# Patient Record
Sex: Female | Born: 1955 | Race: Black or African American | Hispanic: No | Smoking: Never smoker
Health system: Southern US, Community
[De-identification: ages and names within clinical notes are randomized; demographics above are authoritative.]

## PROBLEM LIST (undated history)

## (undated) DIAGNOSIS — I10 Essential (primary) hypertension: Secondary | ICD-10-CM

## (undated) DIAGNOSIS — M722 Plantar fascial fibromatosis: Secondary | ICD-10-CM

## (undated) DIAGNOSIS — M1990 Unspecified osteoarthritis, unspecified site: Secondary | ICD-10-CM

## (undated) DIAGNOSIS — D649 Anemia, unspecified: Secondary | ICD-10-CM

## (undated) DIAGNOSIS — E119 Type 2 diabetes mellitus without complications: Secondary | ICD-10-CM

## (undated) DIAGNOSIS — R51 Headache: Secondary | ICD-10-CM

## (undated) DIAGNOSIS — I2510 Atherosclerotic heart disease of native coronary artery without angina pectoris: Secondary | ICD-10-CM

## (undated) DIAGNOSIS — N200 Calculus of kidney: Secondary | ICD-10-CM

## (undated) DIAGNOSIS — R7302 Impaired glucose tolerance (oral): Secondary | ICD-10-CM

## (undated) DIAGNOSIS — T7840XA Allergy, unspecified, initial encounter: Secondary | ICD-10-CM

## (undated) DIAGNOSIS — E785 Hyperlipidemia, unspecified: Secondary | ICD-10-CM

## (undated) DIAGNOSIS — M8580 Other specified disorders of bone density and structure, unspecified site: Secondary | ICD-10-CM

## (undated) HISTORY — DX: Impaired glucose tolerance (oral): R73.02

## (undated) HISTORY — PX: OOPHORECTOMY: SHX86

## (undated) HISTORY — DX: Anemia, unspecified: D64.9

## (undated) HISTORY — DX: Unspecified osteoarthritis, unspecified site: M19.90

## (undated) HISTORY — DX: Essential (primary) hypertension: I10

## (undated) HISTORY — DX: Other specified disorders of bone density and structure, unspecified site: M85.80

## (undated) HISTORY — DX: Type 2 diabetes mellitus without complications: E11.9

## (undated) HISTORY — DX: Calculus of kidney: N20.0

## (undated) HISTORY — DX: Plantar fascial fibromatosis: M72.2

## (undated) HISTORY — DX: Allergy, unspecified, initial encounter: T78.40XA

## (undated) HISTORY — PX: ABDOMINAL HYSTERECTOMY: SHX81

## (undated) HISTORY — DX: Hyperlipidemia, unspecified: E78.5

## (undated) HISTORY — DX: Atherosclerotic heart disease of native coronary artery without angina pectoris: I25.10

## (undated) HISTORY — PX: TONSILLECTOMY AND ADENOIDECTOMY: SHX28

## (undated) HISTORY — DX: Headache: R51

---

## 2000-09-29 ENCOUNTER — Encounter: Payer: Self-pay | Admitting: Urology

## 2000-09-29 ENCOUNTER — Encounter: Payer: Self-pay | Admitting: Internal Medicine

## 2000-09-29 ENCOUNTER — Inpatient Hospital Stay (HOSPITAL_COMMUNITY): Admission: EM | Admit: 2000-09-29 | Discharge: 2000-10-02 | Payer: Self-pay | Admitting: Internal Medicine

## 2000-09-30 ENCOUNTER — Encounter: Payer: Self-pay | Admitting: Internal Medicine

## 2000-10-24 ENCOUNTER — Encounter: Payer: Self-pay | Admitting: Urology

## 2000-10-24 ENCOUNTER — Encounter: Admission: RE | Admit: 2000-10-24 | Discharge: 2000-10-24 | Payer: Self-pay | Admitting: Urology

## 2000-11-03 ENCOUNTER — Ambulatory Visit (HOSPITAL_COMMUNITY): Admission: RE | Admit: 2000-11-03 | Discharge: 2000-11-03 | Payer: Self-pay | Admitting: Urology

## 2000-11-03 ENCOUNTER — Encounter: Payer: Self-pay | Admitting: Urology

## 2000-11-28 ENCOUNTER — Encounter: Admission: RE | Admit: 2000-11-28 | Discharge: 2000-11-28 | Payer: Self-pay | Admitting: Urology

## 2000-11-28 ENCOUNTER — Encounter: Payer: Self-pay | Admitting: Urology

## 2000-12-08 ENCOUNTER — Ambulatory Visit (HOSPITAL_COMMUNITY): Admission: RE | Admit: 2000-12-08 | Discharge: 2000-12-08 | Payer: Self-pay | Admitting: Urology

## 2000-12-10 ENCOUNTER — Emergency Department (HOSPITAL_COMMUNITY): Admission: EM | Admit: 2000-12-10 | Discharge: 2000-12-10 | Payer: Self-pay | Admitting: Emergency Medicine

## 2001-11-01 ENCOUNTER — Ambulatory Visit (HOSPITAL_COMMUNITY): Admission: RE | Admit: 2001-11-01 | Discharge: 2001-11-01 | Payer: Self-pay | Admitting: Family Medicine

## 2001-11-01 ENCOUNTER — Encounter: Payer: Self-pay | Admitting: Family Medicine

## 2003-08-19 ENCOUNTER — Other Ambulatory Visit: Admission: RE | Admit: 2003-08-19 | Discharge: 2003-08-19 | Payer: Self-pay | Admitting: Obstetrics and Gynecology

## 2004-08-28 ENCOUNTER — Ambulatory Visit: Payer: Self-pay | Admitting: Internal Medicine

## 2004-10-08 ENCOUNTER — Other Ambulatory Visit: Admission: RE | Admit: 2004-10-08 | Discharge: 2004-10-08 | Payer: Self-pay | Admitting: Obstetrics and Gynecology

## 2004-12-02 ENCOUNTER — Ambulatory Visit: Payer: Self-pay | Admitting: Internal Medicine

## 2005-06-24 ENCOUNTER — Ambulatory Visit: Payer: Self-pay | Admitting: Internal Medicine

## 2005-06-29 ENCOUNTER — Ambulatory Visit: Payer: Self-pay | Admitting: Internal Medicine

## 2005-12-06 ENCOUNTER — Other Ambulatory Visit: Admission: RE | Admit: 2005-12-06 | Discharge: 2005-12-06 | Payer: Self-pay | Admitting: Obstetrics and Gynecology

## 2006-04-08 ENCOUNTER — Ambulatory Visit: Payer: Self-pay | Admitting: Internal Medicine

## 2006-04-18 ENCOUNTER — Ambulatory Visit: Payer: Self-pay | Admitting: Internal Medicine

## 2006-09-26 ENCOUNTER — Ambulatory Visit: Payer: Self-pay | Admitting: Internal Medicine

## 2006-09-26 LAB — CONVERTED CEMR LAB
ALT: 18 units/L (ref 0–40)
AST: 21 units/L (ref 0–37)
Albumin: 3.9 g/dL (ref 3.5–5.2)
Alkaline Phosphatase: 70 units/L (ref 39–117)
BUN: 10 mg/dL (ref 6–23)
CO2: 31 meq/L (ref 19–32)
Calcium: 9.6 mg/dL (ref 8.4–10.5)
Chloride: 107 meq/L (ref 96–112)
Chol/HDL Ratio, serum: 5.1
Cholesterol: 230 mg/dL (ref 0–200)
Creatinine, Ser: 0.8 mg/dL (ref 0.4–1.2)
GFR calc non Af Amer: 81 mL/min
Glomerular Filtration Rate, Af Am: 98 mL/min/{1.73_m2}
Glucose, Bld: 102 mg/dL — ABNORMAL HIGH (ref 70–99)
HCT: 38.8 % (ref 36.0–46.0)
HDL: 45.1 mg/dL (ref >39.0)
Hemoglobin: 13.1 g/dL (ref 12.0–15.0)
LDL DIRECT: 171.2 mg/dL
MCHC: 33.8 g/dL (ref 30.0–36.0)
MCV: 82 fL (ref 78.0–100.0)
Platelets: 264 10*3/uL (ref 150–400)
Potassium: 4.1 meq/L (ref 3.5–5.1)
RBC: 4.74 M/uL (ref 3.87–5.11)
RDW: 12.6 % (ref 11.5–14.6)
Sodium: 144 meq/L (ref 135–145)
TSH: 1.28 microintl units/mL (ref 0.35–5.50)
Total Bilirubin: 0.5 mg/dL (ref 0.3–1.2)
Total Protein: 7.9 g/dL (ref 6.0–8.3)
Triglyceride fasting, serum: 95 mg/dL (ref 0–149)
VLDL: 19 mg/dL (ref 0–40)
WBC: 7.7 10*3/uL (ref 4.5–10.5)

## 2006-09-29 ENCOUNTER — Ambulatory Visit: Payer: Self-pay | Admitting: Internal Medicine

## 2006-12-05 ENCOUNTER — Ambulatory Visit: Payer: Self-pay | Admitting: Internal Medicine

## 2007-07-03 ENCOUNTER — Ambulatory Visit: Payer: Self-pay | Admitting: Internal Medicine

## 2007-08-18 ENCOUNTER — Ambulatory Visit: Payer: Self-pay | Admitting: Internal Medicine

## 2007-09-28 ENCOUNTER — Ambulatory Visit: Payer: Self-pay | Admitting: Internal Medicine

## 2007-09-28 LAB — CONVERTED CEMR LAB
ALT: 13 units/L (ref 0–35)
AST: 15 units/L (ref 0–37)
Albumin: 3.8 g/dL (ref 3.5–5.2)
Alkaline Phosphatase: 52 units/L (ref 39–117)
BUN: 7 mg/dL (ref 6–23)
Basophils Absolute: 0 10*3/uL (ref 0.0–0.1)
Basophils Relative: 0.1 % (ref 0.0–1.0)
Bilirubin Urine: NEGATIVE
Bilirubin, Direct: 0.1 mg/dL (ref 0.0–0.3)
CO2: 29 meq/L (ref 19–32)
Calcium: 9.5 mg/dL (ref 8.4–10.5)
Chloride: 104 meq/L (ref 96–112)
Cholesterol: 210 mg/dL (ref 0–200)
Creatinine, Ser: 0.8 mg/dL (ref 0.4–1.2)
Direct LDL: 149 mg/dL
Eosinophils Absolute: 0.1 10*3/uL (ref 0.0–0.6)
Eosinophils Relative: 1.4 % (ref 0.0–5.0)
GFR calc Af Amer: 98 mL/min
GFR calc non Af Amer: 81 mL/min
Glucose, Bld: 103 mg/dL — ABNORMAL HIGH (ref 70–99)
Glucose, Urine, Semiquant: NEGATIVE
HCT: 36.5 % (ref 36.0–46.0)
HDL: 39.3 mg/dL (ref >39.0)
Hemoglobin: 12.6 g/dL (ref 12.0–15.0)
Ketones, urine, test strip: NEGATIVE
Lymphocytes Relative: 34.1 % (ref 12.0–46.0)
MCHC: 34.5 g/dL (ref 30.0–36.0)
MCV: 80.9 fL (ref 78.0–100.0)
Monocytes Absolute: 0.4 10*3/uL (ref 0.2–0.7)
Monocytes Relative: 8 % (ref 3.0–11.0)
Neutro Abs: 3 10*3/uL (ref 1.4–7.7)
Neutrophils Relative %: 56.4 % (ref 43.0–77.0)
Nitrite: NEGATIVE
Platelets: 294 10*3/uL (ref 150–400)
Potassium: 4.2 meq/L (ref 3.5–5.1)
RBC: 4.51 M/uL (ref 3.87–5.11)
RDW: 13.2 % (ref 11.5–14.6)
Sodium: 140 meq/L (ref 135–145)
Specific Gravity, Urine: 1.025
TSH: 1.25 microintl units/mL (ref 0.35–5.50)
Total Bilirubin: 0.7 mg/dL (ref 0.3–1.2)
Total CHOL/HDL Ratio: 5.3
Total Protein: 7.5 g/dL (ref 6.0–8.3)
Triglycerides: 82 mg/dL (ref 0–149)
Urobilinogen, UA: 0.2
VLDL: 16 mg/dL (ref 0–40)
WBC Urine, dipstick: NEGATIVE
WBC: 5.3 10*3/uL (ref 4.5–10.5)
pH: 5.5

## 2007-10-30 ENCOUNTER — Ambulatory Visit: Payer: Self-pay | Admitting: Internal Medicine

## 2007-10-30 DIAGNOSIS — N200 Calculus of kidney: Secondary | ICD-10-CM | POA: Insufficient documentation

## 2007-11-10 ENCOUNTER — Ambulatory Visit: Payer: Self-pay | Admitting: Internal Medicine

## 2007-11-17 ENCOUNTER — Ambulatory Visit: Payer: Self-pay | Admitting: Internal Medicine

## 2007-11-17 ENCOUNTER — Encounter: Payer: Self-pay | Admitting: Internal Medicine

## 2008-01-23 ENCOUNTER — Telehealth: Payer: Self-pay | Admitting: Internal Medicine

## 2008-04-02 ENCOUNTER — Ambulatory Visit: Payer: Self-pay | Admitting: Internal Medicine

## 2008-04-02 DIAGNOSIS — E785 Hyperlipidemia, unspecified: Secondary | ICD-10-CM | POA: Insufficient documentation

## 2008-09-11 ENCOUNTER — Ambulatory Visit: Payer: Self-pay | Admitting: Internal Medicine

## 2008-10-14 ENCOUNTER — Ambulatory Visit: Payer: Self-pay | Admitting: Internal Medicine

## 2008-10-14 LAB — CONVERTED CEMR LAB
Cholesterol: 241 mg/dL (ref 0–200)
HDL: 45.6 mg/dL (ref >39.0)
Total CHOL/HDL Ratio: 5.3
VLDL: 18 mg/dL (ref 0–40)

## 2008-11-15 ENCOUNTER — Telehealth: Payer: Self-pay | Admitting: Internal Medicine

## 2008-11-23 ENCOUNTER — Emergency Department (HOSPITAL_COMMUNITY): Admission: EM | Admit: 2008-11-23 | Discharge: 2008-11-23 | Payer: Self-pay | Admitting: Emergency Medicine

## 2009-01-01 ENCOUNTER — Encounter: Payer: Self-pay | Admitting: Internal Medicine

## 2009-04-01 ENCOUNTER — Ambulatory Visit: Payer: Self-pay | Admitting: Internal Medicine

## 2009-04-01 LAB — CONVERTED CEMR LAB
ALT: 13 units/L (ref 0–35)
Albumin: 3.8 g/dL (ref 3.5–5.2)
Basophils Relative: 0.2 % (ref 0.0–3.0)
Bilirubin, Direct: 0 mg/dL (ref 0.0–0.3)
CO2: 26 meq/L (ref 19–32)
Chloride: 111 meq/L (ref 96–112)
Creatinine, Ser: 0.7 mg/dL (ref 0.4–1.2)
Direct LDL: 156.3 mg/dL
Eosinophils Relative: 1.3 % (ref 0.0–5.0)
HCT: 35.2 % — ABNORMAL LOW (ref 36.0–46.0)
Hemoglobin: 12.3 g/dL (ref 12.0–15.0)
MCHC: 34.9 g/dL (ref 30.0–36.0)
MCV: 81 fL (ref 78.0–100.0)
Monocytes Absolute: 0.4 10*3/uL (ref 0.1–1.0)
Neutro Abs: 2.9 10*3/uL (ref 1.4–7.7)
Neutrophils Relative %: 56.9 % (ref 43.0–77.0)
Potassium: 4.2 meq/L (ref 3.5–5.1)
RBC: 4.34 M/uL (ref 3.87–5.11)
Sodium: 140 meq/L (ref 135–145)
Total CHOL/HDL Ratio: 5
Total Protein: 7.7 g/dL (ref 6.0–8.3)
Urobilinogen, UA: 0.2
VLDL: 12 mg/dL (ref 0.0–40.0)
WBC Urine, dipstick: NEGATIVE
WBC: 5.2 10*3/uL (ref 4.5–10.5)

## 2009-04-22 ENCOUNTER — Ambulatory Visit: Payer: Self-pay | Admitting: Internal Medicine

## 2009-04-22 DIAGNOSIS — I10 Essential (primary) hypertension: Secondary | ICD-10-CM | POA: Insufficient documentation

## 2009-07-30 ENCOUNTER — Ambulatory Visit: Payer: Self-pay | Admitting: Family Medicine

## 2009-07-31 ENCOUNTER — Telehealth: Payer: Self-pay | Admitting: Internal Medicine

## 2009-08-27 ENCOUNTER — Ambulatory Visit: Payer: Self-pay | Admitting: Internal Medicine

## 2009-09-29 ENCOUNTER — Ambulatory Visit: Payer: Self-pay | Admitting: Internal Medicine

## 2009-10-27 ENCOUNTER — Telehealth: Payer: Self-pay | Admitting: Internal Medicine

## 2009-10-27 ENCOUNTER — Ambulatory Visit: Payer: Self-pay | Admitting: Internal Medicine

## 2010-03-03 ENCOUNTER — Ambulatory Visit: Payer: Self-pay | Admitting: Internal Medicine

## 2010-05-15 ENCOUNTER — Ambulatory Visit: Payer: Self-pay | Admitting: Internal Medicine

## 2010-05-15 DIAGNOSIS — E739 Lactose intolerance, unspecified: Secondary | ICD-10-CM

## 2010-07-06 ENCOUNTER — Ambulatory Visit: Payer: Self-pay | Admitting: Internal Medicine

## 2010-07-06 DIAGNOSIS — M549 Dorsalgia, unspecified: Secondary | ICD-10-CM | POA: Insufficient documentation

## 2010-10-22 ENCOUNTER — Other Ambulatory Visit: Payer: Self-pay | Admitting: Internal Medicine

## 2010-10-22 ENCOUNTER — Ambulatory Visit
Admission: RE | Admit: 2010-10-22 | Discharge: 2010-10-22 | Payer: Self-pay | Source: Home / Self Care | Attending: Internal Medicine | Admitting: Internal Medicine

## 2010-10-22 LAB — CBC WITH DIFFERENTIAL/PLATELET
Basophils Absolute: 0 10*3/uL (ref 0.0–0.1)
Basophils Relative: 0.6 % (ref 0.0–3.0)
Eosinophils Absolute: 0.1 10*3/uL (ref 0.0–0.7)
Eosinophils Relative: 1.1 % (ref 0.0–5.0)
HCT: 37.1 % (ref 36.0–46.0)
Hemoglobin: 12.8 g/dL (ref 12.0–15.0)
Lymphocytes Relative: 27.5 % (ref 12.0–46.0)
Lymphs Abs: 1.7 10*3/uL (ref 0.7–4.0)
MCHC: 34.6 g/dL (ref 30.0–36.0)
MCV: 82 fl (ref 78.0–100.0)
Monocytes Absolute: 0.5 10*3/uL (ref 0.1–1.0)
Monocytes Relative: 8.4 % (ref 3.0–12.0)
Neutro Abs: 3.9 10*3/uL (ref 1.4–7.7)
Neutrophils Relative %: 62.4 % (ref 43.0–77.0)
Platelets: 236 10*3/uL (ref 150.0–400.0)
RBC: 4.52 Mil/uL (ref 3.87–5.11)
RDW: 13.9 % (ref 11.5–14.6)
WBC: 6.3 10*3/uL (ref 4.5–10.5)

## 2010-10-22 LAB — CONVERTED CEMR LAB
Nitrite: NEGATIVE
Protein, U semiquant: NEGATIVE
Specific Gravity, Urine: 1.025
WBC Urine, dipstick: NEGATIVE

## 2010-10-23 LAB — LDL CHOLESTEROL, DIRECT: Direct LDL: 159.3 mg/dL

## 2010-10-23 LAB — HEPATIC FUNCTION PANEL
ALT: 13 U/L (ref 0–35)
AST: 16 U/L (ref 0–37)
Albumin: 3.7 g/dL (ref 3.5–5.2)
Alkaline Phosphatase: 50 U/L (ref 39–117)
Bilirubin, Direct: 0.1 mg/dL (ref 0.0–0.3)
Total Bilirubin: 0.8 mg/dL (ref 0.3–1.2)
Total Protein: 7.3 g/dL (ref 6.0–8.3)

## 2010-10-23 LAB — LIPID PANEL
Cholesterol: 219 mg/dL — ABNORMAL HIGH (ref 0–200)
HDL: 40.9 mg/dL (ref >39.00)
Total CHOL/HDL Ratio: 5
Triglycerides: 137 mg/dL (ref 0.0–149.0)
VLDL: 27.4 mg/dL (ref 0.0–40.0)

## 2010-10-23 LAB — BASIC METABOLIC PANEL
BUN: 11 mg/dL (ref 6–23)
CO2: 30 mEq/L (ref 19–32)
Calcium: 9.6 mg/dL (ref 8.4–10.5)
Chloride: 105 mEq/L (ref 96–112)
Creatinine, Ser: 0.9 mg/dL (ref 0.4–1.2)
GFR: 88.6 mL/min (ref >60.00)
Glucose, Bld: 107 mg/dL — ABNORMAL HIGH (ref 70–99)
Potassium: 4.5 mEq/L (ref 3.5–5.1)
Sodium: 138 mEq/L (ref 135–145)

## 2010-10-23 LAB — TSH: TSH: 1.32 u[IU]/mL (ref 0.35–5.50)

## 2010-10-30 ENCOUNTER — Ambulatory Visit
Admission: RE | Admit: 2010-10-30 | Discharge: 2010-10-30 | Payer: Self-pay | Source: Home / Self Care | Attending: Internal Medicine | Admitting: Internal Medicine

## 2010-11-17 NOTE — Assessment & Plan Note (Signed)
Summary: CUT ON FOOT/NJR   Vital Signs:  Patient profile:   55 year old female Weight:      155 pounds Temp:     98.2 degrees F oral BP sitting:   116 / 72  (right arm) Cuff size:   regular  Vitals Entered By: Duard Brady LPN (May 15, 2010 4:23 PM) CC: had split in skin under (R) pinky ote - worried about diabetes.  Is Patient Diabetic? No   CC:  had split in skin under (R) pinky ote - worried about diabetes. .  History of Present Illness: 55 year old patient who has a history of impaired glucose tolerance who noted a lesion involving the plantar surface of her right fifth toe.  she was concerned about diabetic complications.  The area has largely resolved.  She does have treated hypertension.  Allergies (verified): No Known Drug Allergies  Physical Exam  General:  Well-developed,well-nourished,in no acute distress; alert,appropriate and cooperative throughout examination; blood pressure 110/70 Skin:  mound area of scaling involving plantar aspect of her right fifth toe at the junction of the MTP joint.  No erythema or other inflammatory changes noted   Impression & Recommendations:  Problem # 1:  HYPERTENSION (ICD-401.9)  Her updated medication list for this problem includes:    Lisinopril 20 Mg Tabs (Lisinopril) ..... One daily  Problem # 2:  IMPAIRED GLUCOSE TOLERANCE (ICD-271.3)  Complete Medication List: 1)  Famvir 250 Mg Tabs (Famciclovir) .... As dir 2)  Lisinopril 20 Mg Tabs (Lisinopril) .... One daily 3)  Hydrocodone-homatropine 5-1.5 Mg/75ml Syrp (Hydrocodone-homatropine) .Marland Kitchen.. 1 teaspoon every 6 hours as needed for cough  Patient Instructions: 1)  Please schedule a follow-up appointment in 6 months for CPX 2)  It is important that you exercise regularly at least 20 minutes 5 times a week. If you develop chest pain, have severe difficulty breathing, or feel very tired , stop exercising immediately and seek medical attention. 3)  Check your Blood Pressure  regularly. If it is above: you should make an appointment.

## 2010-11-17 NOTE — Progress Notes (Signed)
Summary: bp high  Phone Note Call from Patient Call back at [phone]   Caller: vm Call For: kwia Summary of Call: BP still high on 20mg  lisinopril & feeling some kind of pains in my chest.  140/94 146/94.  Dec ov.   Initial call taken by: Rudy Jew, RN,  October 27, 2009 11:38 AM  Follow-up for Phone Call        appt scheduled with Dr. Kirtland Bouchard. Follow-up by: Lynann Beaver CMA,  October 27, 2009 11:42 AM

## 2010-11-17 NOTE — Assessment & Plan Note (Signed)
Summary: elevated BP/dm   Vital Signs:  Patient profile:   55 year old female BP sitting:   124 / 68  (left arm) Cuff size:   regular  Vitals Entered By: Raechel Ache, RN (October 27, 2009 3:35 PM) CC: Talk about BP Is Patient Diabetic? No   CC:  Talk about BP.  History of Present Illness: 55 year old patient seen today for follow-up of her hypertension.  She has been quite displeased with her blood pressure control.  She states since he takes blood pressure, insulin morning and blood pressure readings are often elevated above 140/90.  She has been on combination therapy since 2009.  Due to stage II hypertension in the past.  Two months ago.  Diuretic therapy was discontinued due to symptomatic orthostatic hypotension.  Allergies: No Known Drug Allergies  Past History:  Past Medical History: Reviewed history from 04/22/2009 and no changes required. Kidney Stones UTIs Hyperlipidemia IGT Hypertension Headache  Physical Exam  General:  Well-developed,well-nourished,in no acute distress; alert,appropriate and cooperative throughout examination; multiple blood pressure is were taken during this encounter.  The highest blood pressure on arrival is 124/68; blood pressure readings were as low as 106/60 Mouth:  Oral mucosa and oropharynx without lesions or exudates.  Teeth in good repair. Neck:  No deformities, masses, or tenderness noted. Lungs:  Normal respiratory effort, chest expands symmetrically. Lungs are clear to auscultation, no crackles or wheezes. Heart:  Normal rate and regular rhythm. S1 and S2 normal without gallop, murmur, click, rub or other extra sounds. Abdomen:  Bowel sounds positive,abdomen soft and non-tender without masses, organomegaly or hernias noted.   Impression & Recommendations:  Problem # 1:  HYPERTENSION (ICD-401.9)  Her updated medication list for this problem includes:    Lisinopril 20 Mg Tabs (Lisinopril) ..... One daily circadian blood  pressure was higher earlier morning readings were discussed with the patient.  She will monitor blood pressure throughout the day.  Continue her present regimen.  At this time  Problem # 2:  NEPHROLITHIASIS (ICD-592.0)  Complete Medication List: 1)  Famvir 250 Mg Tabs (Famciclovir) .... As dir 2)  Lisinopril 20 Mg Tabs (Lisinopril) .... One daily  Patient Instructions: 1)  Limit your Sodium (Salt). 2)  It is important that you exercise regularly at least 20 minutes 5 times a week. If you develop chest pain, have severe difficulty breathing, or feel very tired , stop exercising immediately and seek medical attention. 3)  Check your Blood Pressure regularly. If it is above: 140/90  you should make an appointment.

## 2010-11-17 NOTE — Assessment & Plan Note (Signed)
Summary: lower back pain/cjr   new and a and in a date, and a the in a and aand he and his the will and is in and a the or for a and and a and and a the, and a no and a pain in he he a is not requiring him to and in a PA and a pain in the on a the a and and and the  Vital Signs:  Patient profile:   55 year old female Weight:      158 pounds Temp:     98.1 degrees F oral BP sitting:   110 / 70  (left arm) Cuff size:   regular  Vitals Entered By: Duard Brady LPN (July 06, 2010 4:03 PM) CC: c/o lowback pain , (L) flank pain , especially when she sits Is Patient Diabetic? No Flu Vaccine Consent Questions     Do you have a history of severe allergic reactions to this vaccine? no    Any prior history of allergic reactions to egg and/or gelatin? no    Do you have a sensitivity to the preservative Thimersol? no    Do you have a past history of Guillan-Barre Syndrome? no    Do you currently have an acute febrile illness? no    Have you ever had a severe reaction to latex? no    Vaccine information given and explained to patient? yes    Are you currently pregnant? no    Lot Number:[id number]   Exp Date:04/17/2011   Site Given  Left Deltoid IM   CC:  c/o lowback pain , (L) flank pain , and especially when she sits.  History of Present Illness: 55 year old patient with a one-week history of left lower back pain.  She has a history of kidney stones, but this pain is clearly different.  Pain began after doing some dancing with a vigorous, twisting, and, other movements.  She has been using low-dose Aleve.  She has treated hypertension  Allergies (verified): No Known Drug Allergies  Past History:  Past Medical History: Reviewed history from 04/22/2009 and no changes required. Kidney Stones UTIs Hyperlipidemia IGT Hypertension Headache  Physical Exam  General:  Well-developed,well-nourished,in no acute distress; alert,appropriate and cooperative throughout  examination Lungs:  Normal respiratory effort, chest expands symmetrically. Lungs are clear to auscultation, no crackles or wheezes. Heart:  Normal rate and regular rhythm. S1 and S2 normal without gallop, murmur, click, rub or other extra sounds. Msk:  lumbar musculature tight and tense straight leg testing negative neurovascular structures intact   Impression & Recommendations:  Problem # 1:  LOW BACK PAIN, ACUTE (ICD-724.2)  Her updated medication list for this problem includes:    Cyclobenzaprine Hcl 10 Mg Tabs (Cyclobenzaprine hcl) ..... One tablet 3 times daily  Orders: UA Dipstick w/o Micro (manual) (16109)  Her updated medication list for this problem includes:    Cyclobenzaprine Hcl 10 Mg Tabs (Cyclobenzaprine hcl) ..... One tablet 3 times daily  Problem # 2:  HYPERTENSION (ICD-401.9)  Her updated medication list for this problem includes:    Lisinopril 20 Mg Tabs (Lisinopril) ..... One daily  Her updated medication list for this problem includes:    Lisinopril 20 Mg Tabs (Lisinopril) ..... One daily  Complete Medication List: 1)  Famvir 250 Mg Tabs (Famciclovir) .... As dir 2)  Lisinopril 20 Mg Tabs (Lisinopril) .... One daily 3)  Cyclobenzaprine Hcl 10 Mg Tabs (Cyclobenzaprine hcl) .... One tablet 3 times daily  Other Orders: Admin  1st Vaccine (16109) Flu Vaccine 54yrs + (60454) Depo- Medrol 80mg  (J1040) Admin of Therapeutic Inj  intramuscular or subcutaneous (09811)  Patient Instructions: 1)  Most patients (90%) with low back pain will improve with time (2-6 weeks). Keep active but avoid activities that are painful. Apply moist heat and/or ice to lower back several times a day. Prescriptions: CYCLOBENZAPRINE HCL 10 MG TABS (CYCLOBENZAPRINE HCL) one tablet 3 times daily  #30 x 0   Entered and Authorized by:   Gordy Savers  MD   Signed by:   Gordy Savers  MD on 07/06/2010   Method used:   Electronically to        [facility] [phone]* (retail)        [address]       [place], [place]  [address]       Ph: [phone] or [phone]       Fax: [phone]   RxID:   [id number] LISINOPRIL 20 MG TABS (LISINOPRIL) one daily  #90 x 6   Entered and Authorized by:   Gordy Savers  MD   Signed by:   Gordy Savers  MD on 07/06/2010   Method used:   Electronically to        [facility] [phone]* (retail)       [address]       [place], [place]  [address]       Ph: [phone] or [phone]       Fax: [phone]   RxID:   [id number]   and a  Medication Administration  Injection # 1:    Medication: Depo- Medrol 80mg     Diagnosis: BACK PAIN (ICD-724.5)    Route: IM    Site: LUOQ gluteus    Exp Date: 01/2013    Lot #: [id number]    Mfr: Pharmacia    Patient tolerated injection without complications    Given by: Duard Brady LPN (July 06, 2010 4:39 PM)  Orders Added: 1)  Admin 1st Vaccine [90471] 2)  Flu Vaccine 19yrs + [76160] 3)  UA Dipstick w/o Micro (manual) [81002] 4)  Est. Patient Level III [73710] 5)  Depo- Medrol 80mg  [J1040] 6)  Admin of Therapeutic Inj  intramuscular or subcutaneous [62694]

## 2010-11-17 NOTE — Assessment & Plan Note (Signed)
Summary: COUGH/CONGESTION/?FEVER/CJR   Vital Signs:  Patient profile:   55 year old female Weight:      152 pounds Temp:     98.5 degrees F oral BP sitting:   110 / 62  (left arm) Cuff size:   regular  Vitals Entered By: Duard Brady LPN (Mar 03, 2010 3:54 PM) CC: c/o cough - productive and waking , congestion  Is Patient Diabetic? No   CC:  c/o cough - productive and waking  and congestion .  History of Present Illness: 55 year old patient who has a history of hypertension.  She presents with a 5-day history of largely nonproductive cough.  Cough is in quite vigorous and at times interferes with sleep.  There is been no definite fever.  She feels her cough has modestly improved.  Antihypertensive regimen includes lisinopril.  She is concerned that lisinopril may be aggravating or causing her cough  Preventive Screening-Counseling & Management  Alcohol-Tobacco     Smoking Status: never  Allergies (verified): No Known Drug Allergies  Past History:  Past Medical History: Reviewed history from 04/22/2009 and no changes required. Kidney Stones UTIs Hyperlipidemia IGT Hypertension Headache  Review of Systems       The patient complains of prolonged cough.  The patient denies anorexia, fever, weight loss, weight gain, vision loss, decreased hearing, hoarseness, chest pain, syncope, dyspnea on exertion, peripheral edema, headaches, hemoptysis, abdominal pain, melena, hematochezia, severe indigestion/heartburn, hematuria, incontinence, genital sores, muscle weakness, suspicious skin lesions, transient blindness, difficulty walking, depression, unusual weight change, abnormal bleeding, enlarged lymph nodes, angioedema, and breast masses.    Physical Exam  General:  Well-developed,well-nourished,in no acute distress; alert,appropriate and cooperative throughout examination Head:  Normocephalic and atraumatic without obvious abnormalities. No apparent alopecia or  balding. Eyes:  No corneal or conjunctival inflammation noted. EOMI. Perrla. Funduscopic exam benign, without hemorrhages, exudates or papilledema. Vision grossly normal. Ears:  External ear exam shows no significant lesions or deformities.  Otoscopic examination reveals clear canals, tympanic membranes are intact bilaterally without bulging, retraction, inflammation or discharge. Hearing is grossly normal bilaterally. Mouth:  Oral mucosa and oropharynx without lesions or exudates.  Teeth in good repair. Neck:  No deformities, masses, or tenderness noted. Lungs:  Normal respiratory effort, chest expands symmetrically. Lungs are clear to auscultation, no crackles or wheezes. Heart:  Normal rate and regular rhythm. S1 and S2 normal without gallop, murmur, click, rub or other extra sounds. Abdomen:  Bowel sounds positive,abdomen soft and non-tender without masses, organomegaly or hernias noted.   Impression & Recommendations:  Problem # 1:  URI (ICD-465.9)  Her updated medication list for this problem includes:    Hydrocodone-homatropine 5-1.5 Mg/71ml Syrp (Hydrocodone-homatropine) .Marland Kitchen... 1 teaspoon every 6 hours as needed for cough  Problem # 2:  HYPERTENSION (ICD-401.9)  Her updated medication list for this problem includes:    Lisinopril 20 Mg Tabs (Lisinopril) ..... One daily  Complete Medication List: 1)  Famvir 250 Mg Tabs (Famciclovir) .... As dir 2)  Lisinopril 20 Mg Tabs (Lisinopril) .... One daily 3)  Hydrocodone-homatropine 5-1.5 Mg/37ml Syrp (Hydrocodone-homatropine) .Marland Kitchen.. 1 teaspoon every 6 hours as needed for cough  Patient Instructions: 1)  substitute Benicar for lisinopril for one month 2)  Delsym 1 teaspoon twice daily 3)  Get plenty of rest, drink lots of clear liquids, and use Tylenol or Ibuprofen for fever and comfort. Return in 7-10 days if you're not better:sooner if you're feeling worse. Prescriptions: HYDROCODONE-HOMATROPINE 5-1.5 MG/5ML SYRP (HYDROCODONE-HOMATROPINE)  1 teaspoon every 6  hours as needed for cough  #6 oz x 0   Entered and Authorized by:   Gordy Savers  MD   Signed by:   Gordy Savers  MD on 03/03/2010   Method used:   Print then Give to Patient   RxID:   [id number]

## 2010-11-19 NOTE — Assessment & Plan Note (Signed)
Summary: CPX/CJR   Vital Signs:  Patient profile:   55 year old female Height:      60.5 inches Weight:      159 pounds BMI:     30.65 Temp:     98.0 degrees F oral BP sitting:   128 / 80  (left arm) Cuff size:   regular  Vitals Entered By: Duard Brady LPN (October 30, 2010 2:57 PM) CC: cpx - doing well Is Patient Diabetic? No   CC:  cpx - doing well.  History of Present Illness: 55 year old patient seen today for a health maintenance examination.  She has a history of impaired glucose tolerance, hypertension, and nephrolithiasis.  For that.  She is doing quite well.  She is followed every spring by gynecology.  Colonoscopy in 2009.  Allergies (verified): No Known Drug Allergies  Past History:  Past Medical History: Reviewed history from 04/22/2009 and no changes required. Kidney Stones UTIs Hyperlipidemia IGT Hypertension Headache  Past Surgical History: Reviewed history from 04/22/2009 and no changes required. C/S Hysterectomy Tonsillectomy  colonoscopy  1-09    Family History: Reviewed history from 04/22/2009 and no changes required. Family History Diabetes 1st degree relative Family History High cholesterol Family History Hypertension Family History of Prostate CA 1st degree relative <50 Family History Weight disorder Family History Kidney disease  father died recently at age 12 of prostate cancer mother history of glucose intolerance hypercholesterolemia, hypothyroidism  Two sisters positive for obesity; one sister had an MI at age 71.  History marked exogenous obesity, as well as tobacco use  Social History: Reviewed history from 07/03/2007 and no changes required. Occupation: Single Never Smoked Alcohol use-no Drug use-no Regular exercise-yes  Review of Systems  The patient denies anorexia, fever, weight loss, weight gain, vision loss, decreased hearing, hoarseness, chest pain, syncope, dyspnea on exertion, peripheral edema,  prolonged cough, headaches, hemoptysis, abdominal pain, melena, hematochezia, severe indigestion/heartburn, hematuria, incontinence, genital sores, muscle weakness, suspicious skin lesions, transient blindness, difficulty walking, depression, unusual weight change, abnormal bleeding, enlarged lymph nodes, angioedema, and breast masses.    Physical Exam  General:  Well-developed,well-nourished,in no acute distress; alert,appropriate and cooperative throughout examination Head:  Normocephalic and atraumatic without obvious abnormalities. No apparent alopecia or balding. Eyes:  No corneal or conjunctival inflammation noted. EOMI. Perrla. Funduscopic exam benign, without hemorrhages, exudates or papilledema. Vision grossly normal. Ears:  External ear exam shows no significant lesions or deformities.  Otoscopic examination reveals clear canals, tympanic membranes are intact bilaterally without bulging, retraction, inflammation or discharge. Hearing is grossly normal bilaterally. Nose:  External nasal examination shows no deformity or inflammation. Nasal mucosa are pink and moist without lesions or exudates. Mouth:  Oral mucosa and oropharynx without lesions or exudates.  Teeth in good repair. Neck:  No deformities, masses, or tenderness noted. Chest Wall:  No deformities, masses, or tenderness noted. Breasts:  No mass, nodules, thickening, tenderness, bulging, retraction, inflamation, nipple discharge or skin changes noted.   Lungs:  Normal respiratory effort, chest expands symmetrically. Lungs are clear to auscultation, no crackles or wheezes. Heart:  Normal rate and regular rhythm. S1 and S2 normal without gallop, murmur, click, rub or other extra sounds. Abdomen:  Bowel sounds positive,abdomen soft and non-tender without masses, organomegaly or hernias noted. Msk:  No deformity or scoliosis noted of thoracic or lumbar spine.   Pulses:  R and L carotid,radial,femoral,dorsalis pedis and posterior tibial  pulses are full and equal bilaterally Extremities:  No clubbing, cyanosis, edema, or deformity noted with  normal full range of motion of all joints.   Neurologic:  No cranial nerve deficits noted. Station and gait are normal. Plantar reflexes are down-going bilaterally. DTRs are symmetrical throughout. Sensory, motor and coordinative functions appear intact. Skin:  Intact without suspicious lesions or rashes Cervical Nodes:  No lymphadenopathy noted Axillary Nodes:  No palpable lymphadenopathy Inguinal Nodes:  No significant adenopathy Psych:  Cognition and judgment appear intact. Alert and cooperative with normal attention span and concentration. No apparent delusions, illusions, hallucinations   Impression & Recommendations:  Problem # 1:  Preventive Health Care (ICD-V70.0)  Complete Medication List: 1)  Famvir 250 Mg Tabs (Famciclovir) .... As dir 2)  Lisinopril 20 Mg Tabs (Lisinopril) .... One daily 3)  Cyclobenzaprine Hcl 10 Mg Tabs (Cyclobenzaprine hcl) .... One tablet 3 times daily  Patient Instructions: 1)  Please schedule a follow-up appointment in 6 months. 2)  Limit your Sodium (Salt). 3)  It is important that you exercise regularly at least 20 minutes 5 times a week. If you develop chest pain, have severe difficulty breathing, or feel very tired , stop exercising immediately and seek medical attention. 4)  You need to lose weight. Consider a lower calorie diet and regular exercise.  5)  Take calcium +Vitamin D daily. Prescriptions: LISINOPRIL 20 MG TABS (LISINOPRIL) one daily  #90 x 6   Entered and Authorized by:   Gordy Savers  MD   Signed by:   Gordy Savers  MD on 10/30/2010   Method used:   [facility] to        [facility] [phone][facility] (retail)       [address]       [place], [place]  [address]       Ph: [phone] or [phone]       Fax: [phone]   RxID:   [id number] FAMVIR 250 MG TABS (FAMCICLOVIR) as dir  #12 x 6   Entered and  Authorized by:   Gordy Savers  MD   Signed by:   Gordy Savers  MD on 10/30/2010   Method used:   [facility]ronically to        [facility] [phone]* (retail)       [address]       [place], [place]  [address]       Ph: [phone] or [phone]       Fax: [phone]   RxID:   [id number]    Orders Added: 1)  Est. Patient 40-64 years [42706]

## 2011-02-05 ENCOUNTER — Ambulatory Visit (INDEPENDENT_AMBULATORY_CARE_PROVIDER_SITE_OTHER): Payer: BC Managed Care – PPO | Admitting: Family Medicine

## 2011-02-05 ENCOUNTER — Encounter: Payer: Self-pay | Admitting: Family Medicine

## 2011-02-05 ENCOUNTER — Ambulatory Visit (INDEPENDENT_AMBULATORY_CARE_PROVIDER_SITE_OTHER)
Admission: RE | Admit: 2011-02-05 | Discharge: 2011-02-05 | Disposition: A | Discharge status: Home / Self Care | Payer: BC Managed Care – PPO | Source: Ambulatory Visit | Attending: Family Medicine | Admitting: Family Medicine

## 2011-02-05 VITALS — BP 110/60 | HR 70 | Temp 99.0°F | Wt 157.0 lb

## 2011-02-05 DIAGNOSIS — S20229A Contusion of unspecified back wall of thorax, initial encounter: Secondary | ICD-10-CM

## 2011-02-05 NOTE — Progress Notes (Signed)
  Subjective:    Patient ID: Rebecca Paul, female    DOB: [birth year], 55 y.o.   MRN: [id number]  HPI Here for left low back pain and left hip pain after slipping and falling down 2 steps at home on 01-30-11. She landed in a sitting position and has had stiffness and pain ever since. No pain in the legs, no numbness or weakness in the legs. She used hot tub soaks and Tylenol at first, using nothing now. She has been able to go to work all this week.    Review of Systems  Constitutional: Negative.   Gastrointestinal: Negative.   Genitourinary: Negative.   Musculoskeletal: Positive for back pain.       Objective:   Physical Exam  Constitutional: She appears well-developed and well-nourished.       Gets on the exam table easily   Musculoskeletal:       Some ecchymosis over the left buttock. Mildly tender in the left lower back and over the left upper buttock. Spine and hips show full ROM.           Assessment & Plan:  This seems to be a simple contusion but will get Xrays of the area. Rest, ice, Motrin prn .

## 2011-02-08 NOTE — Progress Notes (Signed)
Pt. Informed.

## 2011-03-05 NOTE — Discharge Summary (Signed)
[facility]  Patient:    Rebecca Paul, Rebecca Paul                        MRN: [id number] Adm. Date:  [id number] Disc. Date: [id number] Attending:  Laqueta Jean CC:         Gordy Savers, M.D.   Discharge Summary  FINAL DIAGNOSES:  Right upper ureteral stone.  OPERATIONS:  October 01, 2000:  Cystourethroscopy right retrograde pyelogram, ureteroscopy, stone manipulation in the right kidney, right double J catheter.  HISTORY:  Rebecca Paul is a 55 year old female, admitted with right flank pain and right lower quadrant pain on September 29, 2000 with KUB showing a 5 x 8 mm stone in the region of the right ureter.  She was admitted for control of her ureteral colic.  ALLERGIES:  None.  MEDICATIONS:  None.  SOCIAL HISTORY:  Tobacco: None.  The patient is a [employer] who is currently becoming divorced.  She has a 26 year old daughter living with her.  PHYSICAL EXAMINATION:  VITAL SIGNS:  Temperature 97.2, pulse 50, respiratory rate 17, blood pressure 148/78.  The remaining physical examination is as noted in consultation report of December 13.  ADMISSION LABORATORIES:  White blood cell count 12,800, hematocrit 33.0.  CMET is normal except for a random glucose of 123.  HOSPITAL COURSE:  On the day of December 15, the patient underwent cystoscopy right retrograde pyelogram, ureteroscopy, stone manipulation into the right lower pole calyx.  She was stable, and allowed to be discharged on December 16 with double J catheter in place.  She will return for KUB and lithotripsy consideration.  DISCHARGE CONDITION:  She is discharged in improved condition. DD:  10/02/00 TD:  10/02/00 Job: [id number]

## 2011-03-05 NOTE — Op Note (Signed)
[facility]  Patient:    Rebecca Paul, Rebecca Paul                        MRN: [id number] Proc. Date: 10/01/00 Attending:  Vonzell Schlatter. Patsi Sears, M.D. CC:         Gordy Savers, M.D. [facility]   Operative Report  PREOPERATIVE DIAGNOSIS:  Right upper ureteral stone.  POSTOPERATIVE DIAGNOSIS:  Right upper ureteral stone.  OPERATION:  Cystourethroscopy, retrograde pyelogram, ureteroscopy rigid and flexible, stone manipulation and right double-J catheter.  SURGEON:  Sigmund I. Patsi Sears, M.D.  ANESTHESIA:  General (LMA).  DESCRIPTION OF PROCEDURE:  After appropriate preanesthesia, the patient was brought to the operating, placed on the operating room table in the dorsal supine position where general LMA anesthesia was introduced.  She was then replaced in the low Allen stirrup dorsal lithotomy position where the pubis was prepped with Betadine solution and draped in the usual fashion.  Review of the CT and KUB showed a 1 cm irregular calculus in the proximal right ureter, with obstruction.  Cystourethroscopy and retrograde pyelogram was accomplished, and the above findings were confirmed.  The rigid ureteroscope was passed after guidewire was passed around the stone into the renal pelvis, and the stone cannot be visualized.  Rather, the stone is felt to be pushed into the renal pelvis by the advancing ureteroscope. Ureteroscopy into the renal pelvis and the calices was accomplished, and using the flexible ureteroscope, the stone was identified in the right lower pole calyx.  The second stone seen on CT scan was also identified in the lower pole calyx.  This cannot be basket extracted because of the acute angle of the flexible scope.  The stones do not flush into the renal pelvis.  Therefore, it was felt best to leave a double-J catheter so that the ureter could not be obstructed again and this was accomplished by removing the flexible ureteroscope and  replacing the cystoscope and passing a 6 x 24 cm double-J catheter, coiled in the renal pelvis, and coiled in the bladder.  The patient is given a B&O suppository, 30 of IV Toradol, awakened and taken to the recovery room in good condition. DD:  10/01/00 TD:  10/02/00 Job: [id number]

## 2011-03-05 NOTE — Consult Note (Signed)
[facility]  Patient:    Rebecca Paul, Rebecca Paul                        MRN: [id number] Proc. Date: 09/29/00 Attending:  Vonzell Schlatter. Patsi Sears, M.D. CC:         Gordy Savers, M.D. [facility]   Consultation Report  SUBJECTIVE:  This is a 55 year old separated black female, admitted this morning with right flank and right lower quadrant pain, nausea, vomiting and gross hematuria, and a history of kidney stones (unknown site -- 10 in [place]).  The patient had a KUB with an apparent report of a 5 x 8-mm stone in the region of the right ureter.  ALLERGIES:  None.  MEDICATIONS:  None.  SOCIAL HISTORY:  Tobacco:  None.  The patient is a Electrical engineer.  She is becoming divorced.  She has a 29 year old daughter who lives with her; she is currently taken care of by her family.  OBJECTIVE  VITAL SIGNS:  Temperature 97.2, pulse 50, respiratory rate 17, blood pressure 148/78.  GENERAL:  A well-developed, well-nourished black female in mild distress.  ABDOMEN:  She has right flank and right lower quadrant pain to palpation. There are no masses and no organomegaly.  LABORATORY DATA:  Laboratories are pending.  ASSESSMENT:  Right ureteral stone, by history of x-ray.  PLAN 1. Non-contrast CT today. 2. Check labs. 3. Probably to OR for extraction on Friday, but probably Saturday morning. 4. She may be transferred after CT scan to my service. DD:  09/29/00 TD:  09/29/00 Job: [id number]

## 2011-03-05 NOTE — Op Note (Signed)
[facility]  Patient:    NARDA, FUNDORA                        MRN: [id number] Proc. Date: 12/08/00 Adm. Date:  [id number] Attending:  Laqueta Jean                           Operative Report  PREOPERATIVE DIAGNOSIS:  Right upper ureteral stone.  POSTOPERATIVE DIAGNOSIS:  Right upper ureteral stone.  OPERATION: 1. Cystourethroscopy. 2. Right ureteroscopy with removal of previously place 6 x 24 cm JJ catheter. 3. Right retrograde pyelogram. 4. Right ureteroscopy with basket destruction of right upper ureteral stone    and replacement of right JJ catheter.  SURGEON:  Sigmund I. Patsi Sears, M.D.  ANESTHESIA:  General endotracheal.  PROCEDURE PREPARATION:  After appropriate pre-anesthesia, the patient was brought to the operating room and placed on the operating table in the dorsal supine position where general endotracheal anesthesia was introduced.  She was then replaced in the dorsolithotomy position where the pubis prepped with Betadine solution and draped in the usual fashion.  DESCRIPTION OF PROCEDURE:  Cystourethroscopy was accomplished, and the right ureteroscopy was accomplished which showed the right JJ catheter coiled in the right ureter.  This was grasped and removed and a guidewire passed through the JJ catheter and coiled in the renal pelvis.  Following this, right upper ureteroscopy was accomplished with the finding of a 5 x 8 mm stone which was basket extracted.  Following this and because of clots found in the ureter, it was elected to replace a 6 x 26 JJ catheter, and this was accomplished over the guidewire without difficulty.  Xylocaine jelly was left in the ureter, nd the patient was given IV Toradol, B & O Suppository, and the bladder drained of fluid.  The patient was then awakened and taken to the recovery room in good condition. DD:  12/08/00 TD:  12/09/00 Job: [id number]

## 2011-03-05 NOTE — Discharge Summary (Signed)
[facility]  Patient:    Rebecca Paul, Rebecca Paul                        MRN: [id number] Adm. Date:  [id number] Disc. Date: 10/02/00 Attending:  Laqueta Jean                           Discharge Summary  FINAL DIAGNOSIS:  Right upper ureteral calculus with hydronephrosis.  ADMISSION HISTORY:  Ms. Romanek is a 55 year old separated black female, who was admitted on the morning of December 13, with right flank and right lower quadrant pain, nausea, vomiting, gross hematuria.  The patient has a history of kidney stones at an unknown site in 56 in [place].  The patient had a KUB at [facility] with a apparent report of a 5 x 8 mm stone in the region of the right upper ureter.  She was admitted by Dr. Amador Cunas for hydration, nausea and vomiting control.  ALLERGIES:  None.  OUTPATIENT MEDICATIONS:  None.  SOCIAL HISTORY:  Tobacco:  None.  Alcohol:  Occasional.  The patient is a [employer] who is currently becoming divorced.  She has 62 year old daughter, who lives with her.  The child is currently taken care of by her family.  PHYSICAL EXAMINATION ON ADMISSION:  VITAL SIGNS:  Temperature 97.2, pulse was 50, respiratory rate 17, blood pressure 148/78.  GENERAL:  A well-developed, well-nourished black female in mild distress. Remaining physical examination is as noted on Consultation Note of September 29, 2000.  LABORATORY AND X-RAY DATA:  The patient has a white blood cell count 12,800, hemoglobin 12.0, hematocrit 33.0 with PT 13.3, PTT 26.  Sodium was 138, potassium 3.8, chloride 108, CO2 26, BUN 9, creatinine 0.9.  Random glucose 123.  Liver function tests were normal.  Amylase was 75.  HOSPITAL COURSE:  On December 15, the patient underwent cystoscopy, retrograde pyelogram, right ureteroscopy.  The stone was manipulated into the lower pole of the right renal calyx along with a second stone which was already in  the right lower pole calyx.  These stones could not be basket extracted.  The patient had Double J catheter placed.  Postoperatively, the patient was very stable, and will be discharged on December 16.  DISCHARGE FOLLOWUP:  She will return for evaluation for lithotripsy scheduling after Christmas, and we will leave her Double J catheter in until after lithotripsy.  She will call the office for appointment. DD:  10/01/00 TD:  10/02/00 Job: [id number]

## 2011-03-05 NOTE — H&P (Signed)
[facility]  Patient:    Rebecca Paul, Rebecca Paul                        MRN: [id number] Attending:  Gordy Savers, M.D. [facility]                         History and Physical  CHIEF COMPLAINT:  Back pain, nausea and vomiting.  HISTORY OF PRESENT ILLNESS:  The patient is a 55 year old black female with a prior history of kidney stones and renal colic in 1990.  She was stable until two to three days prior to admission, when she noted the onset of dark-colored urine.  On the day of admission, she developed severe crampy low back pain involving the right side more than the left, associated with intractable nausea and vomiting.  Due to worsening pain and vomiting, she presented to the office for followup.  She denied any fever or chills, urinary frequency, urgency, dysuria.  A KUB was obtained that revealed a probable stone in the right ureter measuring approximately 5 x 8 mm.  Patient is admitted to the hospital now for evaluation and treatment of suspected renal colic.  PAST MEDICAL HISTORY:  Patient has enjoyed excellent health.  In November of 2000, had a partial hysterectomy due to fibroids; she has also had an oophorectomy in the past and remote tonsillectomy at age 67.  She had a C-section and as mentioned, did have an episode of renal colic in 1990; this was managed as an outpatient.  MEDICATIONS:  At the present time, she takes no medications.  REVIEW OF SYSTEMS:  Review of system exam is otherwise unremarkable except for the onset of the acute illness.  FAMILY HISTORY:  Father had a history of prostate cancer and is doing well at age 65.  Mother, age 35, has hypertension and hyperlipidemia.  Two sisters in good health except for one with obesity.  PHYSICAL EXAMINATION  GENERAL:  General exam revealed a well-developed, well-nourished black female who is in moderate painful distress with episodes of active emesis.  SKIN:  Skin revealed diaphoresis  without rash.  HEENT:  Normal pupillary responses, conjunctivae clear, anicteric.   Ears, nose and throat:  Normal.  NECK:  No adenopathy or bruits.  CHEST:  Clear.  CARDIOVASCULAR:  S1, S2 normal.  No murmurs or gallops.  Rate was 110.  ABDOMEN:  Soft and nontender.  No organomegaly.  No distention or guarding. Bowel sounds were active.  BACK AND SPINE:  No obvious tenderness.  There was no CVA tenderness.  EXTREMITIES:  Negative.  Peripheral pulses were full.  NEUROLOGIC:  Examination negative.  IMPRESSION:  Suspect right-sided renal colic.  DISPOSITION:  Will admit to the hospital, support with IV fluids, will challenge with clear liquids, will treat symptomatically and supportively.  A CT urogram will be reviewed.  Patient will be considered for a urological consult. DD:  09/29/00 TD:  09/29/00 Job: [id number]

## 2011-07-14 ENCOUNTER — Other Ambulatory Visit: Payer: Self-pay | Admitting: Internal Medicine

## 2011-07-22 ENCOUNTER — Ambulatory Visit (INDEPENDENT_AMBULATORY_CARE_PROVIDER_SITE_OTHER): Payer: BC Managed Care – PPO | Admitting: Family Medicine

## 2011-07-22 ENCOUNTER — Encounter: Payer: Self-pay | Admitting: Family Medicine

## 2011-07-22 DIAGNOSIS — K625 Hemorrhage of anus and rectum: Secondary | ICD-10-CM

## 2011-07-22 DIAGNOSIS — K5900 Constipation, unspecified: Secondary | ICD-10-CM

## 2011-07-22 DIAGNOSIS — Z23 Encounter for immunization: Secondary | ICD-10-CM

## 2011-07-22 NOTE — Progress Notes (Signed)
  Subjective:    Patient ID: SHIVALI QUACKENBUSH, female    DOB: [birth year], 55 y.o.   MRN: [id number]  HPI Keyna is a 55 year old female, who comes in today for evaluation of constipation and bright red rectal bleeding.  She has had a long-standing history of intermittent constipation.  She had a colonoscopy at age 69, which was normal.  Yesterday, she developed severe constipation, which required two enemas to relieve.  After the enemas.  She noticed some bright red rectal bleeding and therefore, came in today for evaluation.  The bleeding is painless.   Review of Systems    General and GI review of systems otherwise negative Objective:   Physical Exam  Well-developed nourished, female in no acute distress.  Examination of the abdomen the abdomen was soft.  The bowel sounds are normal.  No palpable tenderness.  No masses.  The rectal exam shows a small tear at the 6 o'clock position in toto.  Exam shows no palpable masses.  Brown stool.  Positive bright red blood      Assessment & Plan:  Constipation with right rib, rectal bleeding, secondary to internal hemorrhoids....... Plan stool softeners for at least 6 months.  Return p.r.n.

## 2011-07-22 NOTE — Patient Instructions (Signed)
Drink at 32 ounces of water daily.  Take a stool softener........Marland Kitchen Milk of Magnesia......... Of daily for the next 6 months.  The bright red rectal bleeding.  Should stop  within the next two weeks............ if it does not then I would call your GI person for further evaluation

## 2011-10-08 ENCOUNTER — Ambulatory Visit: Payer: BC Managed Care – PPO | Admitting: Internal Medicine

## 2011-10-13 ENCOUNTER — Encounter: Payer: Self-pay | Admitting: Internal Medicine

## 2011-10-13 ENCOUNTER — Ambulatory Visit (INDEPENDENT_AMBULATORY_CARE_PROVIDER_SITE_OTHER): Payer: BC Managed Care – PPO | Admitting: Internal Medicine

## 2011-10-13 VITALS — BP 142/100 | Temp 99.0°F | Wt 153.0 lb

## 2011-10-13 DIAGNOSIS — M549 Dorsalgia, unspecified: Secondary | ICD-10-CM

## 2011-10-13 DIAGNOSIS — M545 Low back pain, unspecified: Secondary | ICD-10-CM

## 2011-10-13 DIAGNOSIS — N390 Urinary tract infection, site not specified: Secondary | ICD-10-CM

## 2011-10-13 DIAGNOSIS — I10 Essential (primary) hypertension: Secondary | ICD-10-CM

## 2011-10-13 DIAGNOSIS — N200 Calculus of kidney: Secondary | ICD-10-CM

## 2011-10-13 LAB — POCT URINALYSIS DIPSTICK
Bilirubin, UA: NEGATIVE
Glucose, UA: NEGATIVE
Spec Grav, UA: 1.02
Urobilinogen, UA: 0.2

## 2011-10-13 NOTE — Patient Instructions (Signed)
Limit your sodium (Salt) intake  Return in 3 months for follow-up  Please check your blood pressure on a regular basis.  If it is consistently greater than 150/90, please make an office appointment.   

## 2011-10-13 NOTE — Progress Notes (Signed)
  Subjective:    Patient ID: Rebecca Paul, female    DOB: [birth year], 55 y.o.   MRN: [id number]  HPI  55 year old patient who has a history of nephrolithiasis. She has had at least 4 episodes of renal colic over the years. She was seen by gynecology recently and treated for a UTI related to hematuria. She has some occasional pain in the left lumbar area. Pain is aggravated by movement. She also describes  some urinary frequency. She has hypertension but has been off her medications for a couple of days. Blood pressure is elevated today. For the past 2 months she also describes an intermittent nonproductive cough. She does not wish to change her blood pressure medication at this time unless her cough persists  Wt Readings from Last 3 Encounters:  10/13/11 153 lb (69.4 kg)  07/22/11 155 lb (70.308 kg)  02/05/11 157 lb (71.215 kg)    Review of Systems  Constitutional: Negative.   HENT: Negative for hearing loss, congestion, sore throat, rhinorrhea, dental problem, sinus pressure and tinnitus.   Eyes: Negative for pain, discharge and visual disturbance.  Respiratory: Positive for cough. Negative for shortness of breath.   Cardiovascular: Negative for chest pain, palpitations and leg swelling.  Gastrointestinal: Negative for nausea, vomiting, abdominal pain, diarrhea, constipation, blood in stool and abdominal distention.  Genitourinary: Positive for frequency. Negative for dysuria, urgency, hematuria, flank pain, vaginal bleeding, vaginal discharge, difficulty urinating, vaginal pain and pelvic pain.  Musculoskeletal: Positive for back pain. Negative for joint swelling, arthralgias and gait problem.  Skin: Negative for rash.  Neurological: Negative for dizziness, syncope, speech difficulty, weakness, numbness and headaches.  Hematological: Negative for adenopathy.  Psychiatric/Behavioral: Negative for behavioral problems, dysphoric mood and agitation. The patient is not nervous/anxious.       Objective:   Physical Exam  Constitutional: She is oriented to person, place, and time. She appears well-developed and well-nourished.  HENT:  Head: Normocephalic.  Right Ear: External ear normal.  Left Ear: External ear normal.  Mouth/Throat: Oropharynx is clear and moist.  Eyes: Conjunctivae and EOM are normal. Pupils are equal, round, and reactive to light.  Neck: Normal range of motion. Neck supple. No thyromegaly present.  Cardiovascular: Normal rate, regular rhythm, normal heart sounds and intact distal pulses.   Pulmonary/Chest: Effort normal and breath sounds normal.  Abdominal: Soft. Bowel sounds are normal. She exhibits no mass. There is no tenderness.  Musculoskeletal: Normal range of motion.  Lymphadenopathy:    She has no cervical adenopathy.  Neurological: She is alert and oriented to person, place, and time.  Skin: Skin is warm and dry. No rash noted.  Psychiatric: She has a normal mood and affect. Her behavior is normal.          Assessment & Plan:   Left lumbar back pain. Suspect this is musculoligamentous back pain. We'll treat symptomatically Hypertension. Compliance issues stressed. She does not wish to consider changing her blood pressure medication today unless her cough persists Nephrolithiasis and history of hematuria. We'll followup a UA  Recheck in 3 months

## 2011-11-01 ENCOUNTER — Other Ambulatory Visit (INDEPENDENT_AMBULATORY_CARE_PROVIDER_SITE_OTHER): Payer: BC Managed Care – PPO

## 2011-11-01 DIAGNOSIS — Z0000 Encounter for general adult medical examination without abnormal findings: Secondary | ICD-10-CM

## 2011-11-01 LAB — BASIC METABOLIC PANEL
Chloride: 103 mEq/L (ref 96–112)
GFR: 83.75 mL/min (ref >60.00)
Glucose, Bld: 109 mg/dL — ABNORMAL HIGH (ref 70–99)
Potassium: 3.6 mEq/L (ref 3.5–5.1)
Sodium: 140 mEq/L (ref 135–145)

## 2011-11-01 LAB — POCT URINALYSIS DIPSTICK
Bilirubin, UA: NEGATIVE
Glucose, UA: NEGATIVE
Spec Grav, UA: 1.02
Urobilinogen, UA: 0.2

## 2011-11-01 LAB — HEPATIC FUNCTION PANEL
ALT: 12 U/L (ref 0–35)
Total Bilirubin: 0.4 mg/dL (ref 0.3–1.2)

## 2011-11-01 LAB — CBC WITH DIFFERENTIAL/PLATELET
Eosinophils Relative: 1.4 % (ref 0.0–5.0)
HCT: 35.8 % — ABNORMAL LOW (ref 36.0–46.0)
Hemoglobin: 12.3 g/dL (ref 12.0–15.0)
Lymphs Abs: 1.7 10*3/uL (ref 0.7–4.0)
MCV: 81.8 fl (ref 78.0–100.0)
Monocytes Absolute: 0.4 10*3/uL (ref 0.1–1.0)
Monocytes Relative: 7.8 % (ref 3.0–12.0)
Neutro Abs: 2.8 10*3/uL (ref 1.4–7.7)
WBC: 5 10*3/uL (ref 4.5–10.5)

## 2011-11-01 LAB — LIPID PANEL
Cholesterol: 231 mg/dL — ABNORMAL HIGH (ref 0–200)
VLDL: 14.4 mg/dL (ref 0.0–40.0)

## 2011-11-01 LAB — TSH: TSH: 1.47 u[IU]/mL (ref 0.35–5.50)

## 2011-11-08 ENCOUNTER — Encounter: Payer: BC Managed Care – PPO | Admitting: Internal Medicine

## 2011-11-15 ENCOUNTER — Encounter: Payer: Self-pay | Admitting: Internal Medicine

## 2011-11-15 ENCOUNTER — Ambulatory Visit (INDEPENDENT_AMBULATORY_CARE_PROVIDER_SITE_OTHER): Payer: BC Managed Care – PPO | Admitting: Internal Medicine

## 2011-11-15 DIAGNOSIS — E785 Hyperlipidemia, unspecified: Secondary | ICD-10-CM

## 2011-11-15 DIAGNOSIS — I10 Essential (primary) hypertension: Secondary | ICD-10-CM

## 2011-11-15 DIAGNOSIS — E739 Lactose intolerance, unspecified: Secondary | ICD-10-CM

## 2011-11-15 DIAGNOSIS — Z0000 Encounter for general adult medical examination without abnormal findings: Secondary | ICD-10-CM

## 2011-11-15 MED ORDER — CYCLOBENZAPRINE HCL 10 MG PO TABS: 10.0000 mg | ORAL_TABLET | Freq: Three times a day (TID) | ORAL | Status: DC | PRN

## 2011-11-15 MED ORDER — FAMCICLOVIR 250 MG PO TABS: 250.0000 mg | ORAL_TABLET | Freq: Two times a day (BID) | ORAL | Status: DC

## 2011-11-15 MED ORDER — LISINOPRIL 20 MG PO TABS: 20.0000 mg | ORAL_TABLET | Freq: Every day | ORAL | Status: DC

## 2011-11-15 MED ORDER — FLUTICASONE PROPIONATE 50 MCG/ACT NA SUSP: 1.0000 | Freq: Every day | NASAL | Status: DC

## 2011-11-15 NOTE — Progress Notes (Signed)
  Subjective:    Patient ID: Rebecca Paul, female    DOB: 10-18-1956, 56 y.o.   MRN: [id number]  HPI   56 year old patient who is seen today for a health maintenance examination. She has well-controlled hypertension on ACE inhibitor in. She does have a history of intermittent cough but this seems to be more associated with the postnasal drip and drainage. She has mild glucose intolerance. Laboratory studies were reviewed. She also has a history of mild dyslipidemia. Cardiac risk factors also include a sister with coronary artery disease  Wt Readings from Last 3 Encounters:  11/15/11 157 lb (71.215 kg)  10/13/11 153 lb (69.4 kg)  07/22/11 155 lb (70.308 kg)    Review of Systems  Constitutional: Negative for fever, appetite change, fatigue and unexpected weight change.  HENT: Negative for hearing loss, ear pain, nosebleeds, congestion, sore throat, mouth sores, trouble swallowing, neck stiffness, dental problem, voice change, sinus pressure and tinnitus.   Eyes: Negative for photophobia, pain, redness and visual disturbance.  Respiratory: Negative for cough, chest tightness and shortness of breath.   Cardiovascular: Negative for chest pain, palpitations and leg swelling.  Gastrointestinal: Negative for nausea, vomiting, abdominal pain, diarrhea, constipation, blood in stool, abdominal distention and rectal pain.  Genitourinary: Negative for dysuria, urgency, frequency, hematuria, flank pain, vaginal bleeding, vaginal discharge, difficulty urinating, genital sores, vaginal pain, menstrual problem and pelvic pain.  Musculoskeletal: Negative for back pain and arthralgias.  Skin: Negative for rash.  Neurological: Negative for dizziness, syncope, speech difficulty, weakness, light-headedness, numbness and headaches.  Hematological: Negative for adenopathy. Does not bruise/bleed easily.  Psychiatric/Behavioral: Negative for suicidal ideas, behavioral problems, self-injury, dysphoric mood and  agitation. The patient is not nervous/anxious.        Objective:   Physical Exam  Constitutional: She is oriented to person, place, and time. She appears well-developed and well-nourished.  HENT:  Head: Normocephalic and atraumatic.  Right Ear: External ear normal.  Left Ear: External ear normal.  Mouth/Throat: Oropharynx is clear and moist.  Eyes: Conjunctivae and EOM are normal.  Neck: Normal range of motion. Neck supple. No JVD present. No thyromegaly present.  Cardiovascular: Normal rate, regular rhythm, normal heart sounds and intact distal pulses.   No murmur heard. Pulmonary/Chest: Effort normal and breath sounds normal. She has no wheezes. She has no rales.  Abdominal: Soft. Bowel sounds are normal. She exhibits no distension and no mass. There is no tenderness. There is no rebound and no guarding.  Musculoskeletal: Normal range of motion. She exhibits no edema and no tenderness.  Neurological: She is alert and oriented to person, place, and time. She has normal reflexes. No cranial nerve deficit. She exhibits normal muscle tone. Coordination normal.  Skin: Skin is warm and dry. No rash noted.  Psychiatric: She has a normal mood and affect. Her behavior is normal.          Assessment & Plan:   Preventive health examination Hypertension well controlled Cough probably secondary to postnasal drip Mild dyslipidemia. She does not with to consider drug therapy and I agree we'll continue lifestyle modification efforts  Recheck one year

## 2011-11-15 NOTE — Patient Instructions (Signed)
Limit your sodium (Salt) intake    It is important that you exercise regularly, at least 20 minutes 3 to 4 times per week.  If you develop chest pain or shortness of breath seek  medical attention.  Please check your blood pressure on a regular basis.  If it is consistently greater than 150/90, please make an office appointment.  Return in one year for follow-up  

## 2011-12-22 ENCOUNTER — Ambulatory Visit (INDEPENDENT_AMBULATORY_CARE_PROVIDER_SITE_OTHER)
Admission: RE | Admit: 2011-12-22 | Discharge: 2011-12-22 | Disposition: A | Discharge status: Home / Self Care | Payer: BC Managed Care – PPO | Source: Ambulatory Visit | Attending: Family | Admitting: Family

## 2011-12-22 ENCOUNTER — Ambulatory Visit (INDEPENDENT_AMBULATORY_CARE_PROVIDER_SITE_OTHER): Payer: BC Managed Care – PPO | Admitting: Family

## 2011-12-22 ENCOUNTER — Telehealth: Payer: Self-pay

## 2011-12-22 ENCOUNTER — Encounter: Payer: Self-pay | Admitting: Family

## 2011-12-22 DIAGNOSIS — R059 Cough, unspecified: Secondary | ICD-10-CM

## 2011-12-22 DIAGNOSIS — R05 Cough: Secondary | ICD-10-CM

## 2011-12-22 DIAGNOSIS — J069 Acute upper respiratory infection, unspecified: Secondary | ICD-10-CM

## 2011-12-22 MED ORDER — HYDROCOD POLST-CHLORPHEN POLST 10-8 MG/5ML PO LQCR: 5.0000 mL | Freq: Two times a day (BID) | ORAL | Status: DC | PRN

## 2011-12-22 MED ORDER — DOXYCYCLINE HYCLATE 100 MG PO TABS: 100.0000 mg | ORAL_TABLET | Freq: Two times a day (BID) | ORAL | Status: AC

## 2011-12-22 MED ORDER — PREDNISONE 20 MG PO TABS: ORAL_TABLET | ORAL | Status: AC

## 2011-12-22 NOTE — Telephone Encounter (Signed)
Left message for pt to call back. Rx sent to pharmacy. 

## 2011-12-22 NOTE — Telephone Encounter (Signed)
Message copied by Beverely Low on Wed Dec 22, 2011  4:01 PM ------      Message from: Adline Mango B      Created: Wed Dec 22, 2011  3:58 PM       Right lower lobe pneumonia. Doxycycline 100mg  BID x 10 days. Fill prednisone RX.

## 2011-12-22 NOTE — Progress Notes (Signed)
Subjective:    Patient ID: Rebecca Paul, female    DOB: [birth year], 56 y.o.   MRN: [id number]  HPI Comments: C/o sinus and nasal drainage, sorethroat, shortness of breath,  and productive cough with yellow tinged sputum x several months intermittently. S/s started again this past weekend. Denies fever or chills, nausea, vomiting, diarrhea. Was given nasal spray at last visit which she states is somewhat effective for nasal drainage. Requesting chest xray.  Cough Associated symptoms include postnasal drip, rhinorrhea, a sore throat and shortness of breath. Pertinent negatives include no ear pain.      Review of Systems  Constitutional: Negative.   HENT: Positive for congestion, sore throat, rhinorrhea, postnasal drip and sinus pressure. Negative for hearing loss, ear pain, facial swelling, sneezing, neck pain and ear discharge.   Respiratory: Positive for cough and shortness of breath. Negative for apnea, choking and chest tightness.   Cardiovascular: Negative.    Past Medical History  Diagnosis Date  . Kidney stone   . Hyperlipidemia   . IGT (impaired glucose tolerance)   . Hypertension   . Headache     History   Social History  . Marital Status: Married    Spouse Name: N/A    Number of Children: N/A  . Years of Education: N/A   Occupational History  . Not on file.   Social History Main Topics  . Smoking status: Never Smoker   . Smokeless tobacco: Never Used  . Alcohol Use: No  . Drug Use: No  . Sexually Active: Not on file   Other Topics Concern  . Not on file   Social History Narrative  . No narrative on file    Past Surgical History  Procedure Date  . Abdominal hysterectomy   . Tonsillectomy and adenoidectomy     Family History  Problem Relation Age of Onset  . Hypertension Mother   . Hyperlipidemia Mother   . Thyroid disease Mother   . Cancer Father     prostate  . Obesity Sister   . Diabetes Other   . Hyperlipidemia Other   . Hypertension  Other   . Cancer Other     prostate  . Kidney disease Other   . Obesity Sister   . Heart attack Sister     No Known Allergies  Current Outpatient Prescriptions on File Prior to Visit  Medication Sig Dispense Refill  . cyclobenzaprine (FLEXERIL) 10 MG tablet Take 1 tablet (10 mg total) by mouth 3 (three) times daily as needed.  30 tablet  4  . famciclovir (FAMVIR) 250 MG tablet Take 1 tablet (250 mg total) by mouth 2 (two) times daily.  60 tablet  3  . fluticasone (FLONASE) 50 MCG/ACT nasal spray Place 1 spray into the nose daily.  16 g  2  . lisinopril (PRINIVIL,ZESTRIL) 20 MG tablet Take 1 tablet (20 mg total) by mouth daily.  90 tablet  3    BP 140/80  Temp(Src) 99 F (37.2 C) (Oral)  Wt 157 lb (71.215 kg)chart     Objective:   Physical Exam  Constitutional: She is oriented to person, place, and time. She appears well-developed and well-nourished. No distress.  HENT:  Right Ear: External ear normal.  Left Ear: External ear normal.  Mouth/Throat: No oropharyngeal exudate.  Eyes: Right eye exhibits no discharge. Left eye exhibits no discharge.  Cardiovascular: Normal rate, regular rhythm, normal heart sounds and intact distal pulses.  Exam reveals no gallop and no friction rub.  No murmur heard. Pulmonary/Chest: Effort normal and breath sounds normal. No respiratory distress. She has no wheezes. She has no rales. She exhibits no tenderness.  Neurological: She is alert and oriented to person, place, and time.  Skin: Skin is warm and dry. She is not diaphoretic.          Assessment & Plan:  Assessment: URI, Rhinitis, Cough  Plan: tussinex, Refused prednisone and stating will pick up OTC antihistamine, chest xray, rest and increase po fluids, Teaching handouts provided diagnosis and treatment, encouraged to RTC if s/s get worse or do not resolve in one week. If she is no better, I have advised that she has to consider Lisinopril as a source for her cough. She does note  believe that to be the case at this point.

## 2011-12-22 NOTE — Patient Instructions (Signed)

## 2011-12-23 ENCOUNTER — Telehealth: Payer: Self-pay | Admitting: Internal Medicine

## 2011-12-23 NOTE — Telephone Encounter (Signed)
Called and discussed

## 2011-12-23 NOTE — Telephone Encounter (Signed)
Pt is requesting dr Kirtland Bouchard to call her personally concerning her office visit yesterday with NP.

## 2011-12-23 NOTE — Telephone Encounter (Signed)
Pt aware.

## 2011-12-27 ENCOUNTER — Telehealth: Payer: Self-pay | Admitting: Internal Medicine

## 2011-12-27 NOTE — Telephone Encounter (Signed)
OK to give note.

## 2011-12-27 NOTE — Telephone Encounter (Signed)
Pt called and was in to see Rebecca Paul on Wed 12/22/11 re: ? Dx pneumonia. Pt req a doctors note for pt being out of work on Tues 12/21/11, half day on Wed 12/22/11, then full days on Thurs 12/23/11 and Friday 12/24/11. Pt will return to work on Tues 12/28/11. Pt would like to pick up letter when ready.

## 2011-12-27 NOTE — Telephone Encounter (Signed)
Please advise since you saw pt

## 2011-12-28 NOTE — Telephone Encounter (Signed)
Patient asked to be called prior to faxing note at ph. [phone]

## 2011-12-28 NOTE — Telephone Encounter (Signed)
Patient requests that the note be faxed to [phone].

## 2011-12-28 NOTE — Telephone Encounter (Signed)
Letter done and faxed - pt called as requested but VM - LMTCB if question

## 2011-12-30 ENCOUNTER — Telehealth: Payer: Self-pay | Admitting: Family

## 2011-12-30 NOTE — Telephone Encounter (Signed)
Pt needs another work note with the following dates 3-5, 3-7 and 12-24-2011. Pt returned to work on 12-28-2011 with no restrictions. Please fax to Latrisa Gehl [phone].

## 2011-12-31 NOTE — Telephone Encounter (Signed)
Note sent on 12/28/2011 cover dates 3/5-3/11. Second note is unnecessary.

## 2012-01-26 ENCOUNTER — Other Ambulatory Visit: Payer: Self-pay | Admitting: Internal Medicine

## 2012-01-26 ENCOUNTER — Telehealth: Payer: Self-pay | Admitting: Internal Medicine

## 2012-01-26 NOTE — Telephone Encounter (Signed)
error 

## 2012-08-01 LAB — HM MAMMOGRAPHY: HM Mammogram: NEGATIVE

## 2012-11-13 ENCOUNTER — Ambulatory Visit (INDEPENDENT_AMBULATORY_CARE_PROVIDER_SITE_OTHER): Payer: BC Managed Care – PPO | Admitting: Family

## 2012-11-13 ENCOUNTER — Other Ambulatory Visit: Payer: BC Managed Care – PPO

## 2012-11-13 ENCOUNTER — Encounter: Payer: Self-pay | Admitting: Family

## 2012-11-13 VITALS — BP 120/80 | HR 103 | Temp 100.9°F | Wt 156.0 lb

## 2012-11-13 DIAGNOSIS — J101 Influenza due to other identified influenza virus with other respiratory manifestations: Secondary | ICD-10-CM

## 2012-11-13 DIAGNOSIS — R05 Cough: Secondary | ICD-10-CM

## 2012-11-13 DIAGNOSIS — R059 Cough, unspecified: Secondary | ICD-10-CM

## 2012-11-13 DIAGNOSIS — J111 Influenza due to unidentified influenza virus with other respiratory manifestations: Secondary | ICD-10-CM

## 2012-11-13 DIAGNOSIS — I10 Essential (primary) hypertension: Secondary | ICD-10-CM

## 2012-11-13 MED ORDER — HYDROCOD POLST-CHLORPHEN POLST 10-8 MG/5ML PO LQCR: 5.0000 mL | Freq: Two times a day (BID) | ORAL | Status: DC | PRN

## 2012-11-13 NOTE — Patient Instructions (Addendum)
Influenza Facts  Flu (influenza) is a contagious respiratory illness caused by the influenza viruses. It can cause mild to severe illness. While most healthy people recover from the flu without specific treatment and without complications, older people, young children, and people with certain health conditions are at higher risk for serious complications from the flu, including death.  CAUSES    The flu virus is spread from person to person by respiratory droplets from coughing and sneezing.   A person can also become infected by touching an object or surface with a virus on it and then touching their mouth, eye or nose.   Adults may be able to infect others from 1 day before symptoms occur and up to 7 days after getting sick. So it is possible to give someone the flu even before you know you are sick and continue to infect others while you are sick.  SYMPTOMS    Fever (usually high).   Headache.   Tiredness (can be extreme).   Cough.   Sore throat.   Runny or stuffy nose.   Body aches.   Diarrhea and vomiting may also occur, particularly in children.   These symptoms are referred to as "flu-like symptoms". A lot of different illnesses, including the common cold, can have similar symptoms.  DIAGNOSIS    There are tests that can determine if you have the flu as long you are tested within the first 2 or 3 days of illness.   A doctor's exam and additional tests may be needed to identify if you have a disease that is a complicating the flu.  RISKS AND COMPLICATIONS   Some of the complications caused by the flu include:   Bacterial pneumonia or progressive pneumonia caused by the flu virus.   Loss of body fluids (dehydration).   Worsening of chronic medical conditions, such as heart failure, asthma, or diabetes.   Sinus problems and ear infections.  HOME CARE INSTRUCTIONS    Seek medical care early on.   If you are at high risk from complications of the flu, consult your health-care provider as soon  as you develop flu-like symptoms. Those at high risk for complications include:   People 65 years or older.   People with chronic medical conditions, including diabetes.   Pregnant women.   Young children.   Your caregiver may recommend use of an antiviral medication to help treat the flu.   If you get the flu, get plenty of rest, drink a lot of liquids, and avoid using alcohol and tobacco.   You can take over-the-counter medications to relieve the symptoms of the flu if your caregiver approves. (Never give aspirin to children or teenagers who have flu-like symptoms, particularly fever).  PREVENTION   The single best way to prevent the flu is to get a flu vaccine each fall. Other measures that can help protect against the flu are:   Antiviral Medications   A number of antiviral drugs are approved for use in preventing the flu. These are prescription medications, and a doctor should be consulted before they are used.   Habits for Good Health   Cover your nose and mouth with a tissue when you cough or sneeze, throw the tissue away after you use it.   Wash your hands often with soap and water, especially after you cough or sneeze. If you are not near water, use an alcohol-based hand cleaner.   Avoid people who are sick.   If you get the   flu, stay home from work or school. Avoid contact with other people so that you do not make them sick, too.   Try not to touch your eyes, nose, or mouth as germs ore often spread this way.  IN CHILDREN, EMERGENCY WARNING SIGNS THAT NEED URGENT MEDICAL ATTENTION:   Fast breathing or trouble breathing.   Bluish skin color.   Not drinking enough fluids.   Not waking up or not interacting.   Being so irritable that the child does not want to be held.   Flu-like symptoms improve but then return with fever and worse cough.   Fever with a rash.  IN ADULTS, EMERGENCY WARNING SIGNS THAT NEED URGENT MEDICAL ATTENTION:   Difficulty breathing or shortness of breath.   Pain  or pressure in the chest or abdomen.   Sudden dizziness.   Confusion.   Severe or persistent vomiting.  SEEK IMMEDIATE MEDICAL CARE IF:   You or someone you know is experiencing any of the symptoms above. When you arrive at the emergency center,report that you think you have the flu. You may be asked to wear a mask and/or sit in a secluded area to protect others from getting sick.  MAKE SURE YOU:    Understand these instructions.   Monitor your condition.   Seek medical care if you are getting worse, or not improving.  Document Released: 10/07/2003 Document Revised: 12/27/2011 Document Reviewed: 07/03/2009  [employer] Patient Information 2013 [employer].

## 2012-11-13 NOTE — Progress Notes (Signed)
Subjective:    Patient ID: Rebecca Paul, female    DOB: May 20, 1956, 57 y.o.   MRN: [id number]  HPI 57 year old AAF, nonsmoker, is in today with c/o cough, fever, congestion, headache, and sneezing x 1 week and worsening. She has been taking any medication OTC.    Review of Systems  Constitutional: Positive for fatigue.  HENT: Positive for congestion, sore throat, sneezing and postnasal drip.   Respiratory: Positive for cough.   Cardiovascular: Negative.   Musculoskeletal: Negative.   Skin: Negative.   Neurological: Negative.   Hematological: Negative.   Psychiatric/Behavioral: Negative.    Past Medical History  Diagnosis Date  . Kidney stone   . Hyperlipidemia   . IGT (impaired glucose tolerance)   . Hypertension   . Headache     History   Social History  . Marital Status: Married    Spouse Name: N/A    Number of Children: N/A  . Years of Education: N/A   Occupational History  . Not on file.   Social History Main Topics  . Smoking status: Never Smoker   . Smokeless tobacco: Never Used  . Alcohol Use: No  . Drug Use: No  . Sexually Active: Not on file   Other Topics Concern  . Not on file   Social History Narrative  . No narrative on file    Past Surgical History  Procedure Date  . Abdominal hysterectomy   . Tonsillectomy and adenoidectomy     Family History  Problem Relation Age of Onset  . Hypertension Mother   . Hyperlipidemia Mother   . Thyroid disease Mother   . Cancer Father     prostate  . Obesity Sister   . Diabetes Other   . Hyperlipidemia Other   . Hypertension Other   . Cancer Other     prostate  . Kidney disease Other   . Obesity Sister   . Heart attack Sister     No Known Allergies  Current Outpatient Prescriptions on File Prior to Visit  Medication Sig Dispense Refill  . cyclobenzaprine (FLEXERIL) 10 MG tablet Take 1 tablet (10 mg total) by mouth 3 (three) times daily as needed.  30 tablet  4  . famciclovir (FAMVIR) 250  MG tablet Take 1 tablet (250 mg total) by mouth 2 (two) times daily.  60 tablet  3  . fluticasone (FLONASE) 50 MCG/ACT nasal spray Place 1 spray into the nose daily.  16 g  2  . lisinopril (PRINIVIL,ZESTRIL) 20 MG tablet Take 1 tablet (20 mg total) by mouth daily.  90 tablet  3  . lisinopril (PRINIVIL,ZESTRIL) 20 MG tablet TAKE 1 TABLET BY MOUTH EVERY DAY  90 tablet  3    BP 120/80  Pulse 103  Temp 100.9 F (38.3 C) (Oral)  Wt 156 lb (70.761 kg)  SpO2 98%chart    Objective:   Physical Exam  Constitutional: She is oriented to person, place, and time. She appears well-developed and well-nourished.  HENT:  Right Ear: External ear normal.  Left Ear: External ear normal.  Nose: Nose normal.  Mouth/Throat: Oropharynx is clear and moist.  Neck: Normal range of motion. Neck supple.  Cardiovascular: Normal rate, regular rhythm and normal heart sounds.   Pulmonary/Chest: Effort normal and breath sounds normal.  Neurological: She is alert and oriented to person, place, and time.  Skin: Skin is warm and dry.  Psychiatric: She has a normal mood and affect.     Influenza: positive  Assessment & Plan:  Assessment: Influenza A, Cough, Hypertension  Plan: OTC symptomatic treatment for relief. If cough persist, consider Lisinopril. Rest, drink plenty of fluids. Call the office if symptoms worsen or persist. Recheck as scheduled and sooner as needed.

## 2012-11-20 ENCOUNTER — Encounter: Payer: BC Managed Care – PPO | Admitting: Internal Medicine

## 2012-11-27 ENCOUNTER — Encounter: Payer: BC Managed Care – PPO | Admitting: Internal Medicine

## 2012-12-04 ENCOUNTER — Other Ambulatory Visit (INDEPENDENT_AMBULATORY_CARE_PROVIDER_SITE_OTHER): Payer: BC Managed Care – PPO

## 2012-12-04 DIAGNOSIS — Z0000 Encounter for general adult medical examination without abnormal findings: Secondary | ICD-10-CM

## 2012-12-04 LAB — POCT URINALYSIS DIPSTICK
Bilirubin, UA: NEGATIVE
Glucose, UA: NEGATIVE
Nitrite, UA: NEGATIVE
Spec Grav, UA: 1.02
Urobilinogen, UA: 0.2

## 2012-12-04 LAB — HEPATIC FUNCTION PANEL
ALT: 15 U/L (ref 0–35)
Bilirubin, Direct: 0 mg/dL (ref 0.0–0.3)
Total Bilirubin: 0.6 mg/dL (ref 0.3–1.2)

## 2012-12-04 LAB — CBC WITH DIFFERENTIAL/PLATELET
Basophils Absolute: 0 10*3/uL (ref 0.0–0.1)
HCT: 38 % (ref 36.0–46.0)
Hemoglobin: 12.9 g/dL (ref 12.0–15.0)
Lymphs Abs: 2.2 10*3/uL (ref 0.7–4.0)
MCHC: 33.9 g/dL (ref 30.0–36.0)
MCV: 80.7 fl (ref 78.0–100.0)
Monocytes Absolute: 0.4 10*3/uL (ref 0.1–1.0)
Neutro Abs: 3.8 10*3/uL (ref 1.4–7.7)
Platelets: 259 10*3/uL (ref 150.0–400.0)
RDW: 13.7 % (ref 11.5–14.6)

## 2012-12-04 LAB — BASIC METABOLIC PANEL
BUN: 14 mg/dL (ref 6–23)
CO2: 26 mEq/L (ref 19–32)
Chloride: 105 mEq/L (ref 96–112)
GFR: 95.55 mL/min (ref >60.00)
Glucose, Bld: 99 mg/dL (ref 70–99)
Potassium: 3.9 mEq/L (ref 3.5–5.1)

## 2012-12-04 LAB — TSH: TSH: 1.17 u[IU]/mL (ref 0.35–5.50)

## 2012-12-04 LAB — LIPID PANEL: Cholesterol: 214 mg/dL — ABNORMAL HIGH (ref 0–200)

## 2012-12-04 LAB — LDL CHOLESTEROL, DIRECT: Direct LDL: 140.3 mg/dL

## 2012-12-11 ENCOUNTER — Ambulatory Visit (INDEPENDENT_AMBULATORY_CARE_PROVIDER_SITE_OTHER): Payer: BC Managed Care – PPO | Admitting: Internal Medicine

## 2012-12-11 ENCOUNTER — Encounter: Payer: Self-pay | Admitting: Internal Medicine

## 2012-12-11 VITALS — BP 120/72 | HR 68 | Temp 98.4°F | Resp 18 | Ht 61.0 in

## 2012-12-11 DIAGNOSIS — Z0000 Encounter for general adult medical examination without abnormal findings: Secondary | ICD-10-CM

## 2012-12-11 MED ORDER — LOSARTAN POTASSIUM 100 MG PO TABS: 100.0000 mg | ORAL_TABLET | Freq: Every day | ORAL | Status: DC

## 2012-12-11 NOTE — Patient Instructions (Addendum)
Limit your sodium (Salt) intake    It is important that you exercise regularly, at least 20 minutes 3 to 4 times per week.  If you develop chest pain or shortness of breath seek  medical attention.  Please check your blood pressure on a regular basis.  If it is consistently greater than 150/90, please make an office appointment.  Return in one year for follow-up  

## 2012-12-12 NOTE — Progress Notes (Signed)
Subjective:    Patient ID: Rebecca Paul, female    DOB: 04/16/1956, 57 y.o.   MRN: [id number]  HPI  57 year old patient who is seen today for her annual health exam. She is followed by gynecology medical problems include hypertension dyslipidemia and mild impaired glucose tolerance. Doing quite well today without concerns or complaints  Past Medical History  Diagnosis Date  . Kidney stone   . Hyperlipidemia   . IGT (impaired glucose tolerance)   . Hypertension   . Headache     History   Social History  . Marital Status: Married    Spouse Name: N/A    Number of Children: N/A  . Years of Education: N/A   Occupational History  . Not on file.   Social History Main Topics  . Smoking status: Never Smoker   . Smokeless tobacco: Never Used  . Alcohol Use: No  . Drug Use: No  . Sexually Active: Not on file   Other Topics Concern  . Not on file   Social History Narrative  . No narrative on file    Past Surgical History  Procedure Laterality Date  . Abdominal hysterectomy    . Tonsillectomy and adenoidectomy      Family History  Problem Relation Age of Onset  . Hypertension Mother   . Hyperlipidemia Mother   . Thyroid disease Mother   . Cancer Father     prostate  . Obesity Sister   . Diabetes Other   . Hyperlipidemia Other   . Hypertension Other   . Cancer Other     prostate  . Kidney disease Other   . Obesity Sister   . Heart attack Sister     No Known Allergies  Current Outpatient Prescriptions on File Prior to Visit  Medication Sig Dispense Refill  . cyclobenzaprine (FLEXERIL) 10 MG tablet Take 1 tablet (10 mg total) by mouth 3 (three) times daily as needed.  30 tablet  4  . famciclovir (FAMVIR) 250 MG tablet Take 1 tablet (250 mg total) by mouth 2 (two) times daily.  60 tablet  3  . fluticasone (FLONASE) 50 MCG/ACT nasal spray Place 1 spray into the nose daily.  16 g  2   No current facility-administered medications on file prior to visit.    BP  120/72  Pulse 68  Temp(Src) 98.4 F (36.9 C) (Oral)  Resp 18  Ht 5\' 1"  (1.549 m)  SpO2 98%       Review of Systems  Constitutional: Negative for fever, appetite change, fatigue and unexpected weight change.  HENT: Negative for hearing loss, ear pain, nosebleeds, congestion, sore throat, mouth sores, trouble swallowing, neck stiffness, dental problem, voice change, sinus pressure and tinnitus.   Eyes: Negative for photophobia, pain, redness and visual disturbance.  Respiratory: Negative for cough, chest tightness and shortness of breath.   Cardiovascular: Negative for chest pain, palpitations and leg swelling.  Gastrointestinal: Negative for nausea, vomiting, abdominal pain, diarrhea, constipation, blood in stool, abdominal distention and rectal pain.  Genitourinary: Negative for dysuria, urgency, frequency, hematuria, flank pain, vaginal bleeding, vaginal discharge, difficulty urinating, genital sores, vaginal pain, menstrual problem and pelvic pain.  Musculoskeletal: Negative for back pain and arthralgias.  Skin: Negative for rash.  Neurological: Negative for dizziness, syncope, speech difficulty, weakness, light-headedness, numbness and headaches.  Hematological: Negative for adenopathy. Does not bruise/bleed easily.  Psychiatric/Behavioral: Negative for suicidal ideas, behavioral problems, self-injury, dysphoric mood and agitation. The patient is not nervous/anxious.  Objective:   Physical Exam  Constitutional: She is oriented to person, place, and time. She appears well-developed and well-nourished.  HENT:  Head: Normocephalic and atraumatic.  Right Ear: External ear normal.  Left Ear: External ear normal.  Mouth/Throat: Oropharynx is clear and moist.  Eyes: Conjunctivae and EOM are normal.  Neck: Normal range of motion. Neck supple. No JVD present. No thyromegaly present.  Cardiovascular: Normal rate, regular rhythm, normal heart sounds and intact distal pulses.    No murmur heard. Pulmonary/Chest: Effort normal and breath sounds normal. She has no wheezes. She has no rales.  Abdominal: Soft. Bowel sounds are normal. She exhibits no distension and no mass. There is no tenderness. There is no rebound and no guarding.  Musculoskeletal: Normal range of motion. She exhibits no edema and no tenderness.  Neurological: She is alert and oriented to person, place, and time. She has normal reflexes. No cranial nerve deficit. She exhibits normal muscle tone. Coordination normal.  Skin: Skin is warm and dry. No rash noted.  Psychiatric: She has a normal mood and affect. Her behavior is normal.          Assessment & Plan:   Preventive health examination Hypertension stable Mild dyslipidemia History of impaired glucose tolerance  Medical regimen unchanged Lifestyle issues discussed Recheck 6 months Home blood pressure monitoring encouraged

## 2013-02-23 ENCOUNTER — Telehealth: Payer: Self-pay | Admitting: Internal Medicine

## 2013-02-23 NOTE — Telephone Encounter (Signed)
Patient Information:  Caller Name: Makhia  Phone: [phone]  Patient: Rebecca Paul  Gender: Female  DOB: 06-26-1956  Age: 57 Years  PCP: Eleonore Chiquito [facility])  Pregnant: No  Office Follow Up:  Does the office need to follow up with this patient?: No  Instructions For The Office: N/A   Symptoms  Reason For Call & Symptoms: Pt had screening today and to see about getting a reduction in her insurance and her results came back "horrible" today.  Her HGB A1C came back toay at 7.3.  She is tying to see if she had one of these done in Feb 2014.  Appears none given.  Pt explaines she did not fast and had some wine last night.  She also asked about her cholesterol results from 11/2012, but  these were slightly abnormal so results not given. She is asking "do I have diabetes?"  I told her that is only something her Dr. can answer and she can call Monday 02/26/13 to make an appt.  Also instructed her to begin a list of all the questions she has for her Dr.  Roger Kill understanding and she will call back 02/26/13.  Reviewed Health History In EMR: Yes  Reviewed Medications In EMR: Yes  Reviewed Allergies In EMR: Yes  Reviewed Surgeries / Procedures: Yes  Date of Onset of Symptoms: Unknown OB / GYN:  LMP: Unknown  Guideline(s) Used:  No Protocol Available - Information Only  Disposition Per Guideline:   Home Care  Reason For Disposition Reached:   Information only question and nurse able to answer  Advice Given:  Call Back If:  New symptoms develop  You become worse.  Patient Will Follow Care Advice:  YES

## 2013-02-27 ENCOUNTER — Other Ambulatory Visit: Payer: Self-pay | Admitting: Internal Medicine

## 2013-03-14 ENCOUNTER — Encounter: Payer: Self-pay | Admitting: Internal Medicine

## 2013-03-14 ENCOUNTER — Ambulatory Visit (INDEPENDENT_AMBULATORY_CARE_PROVIDER_SITE_OTHER): Payer: BC Managed Care – PPO | Admitting: Internal Medicine

## 2013-03-14 VITALS — BP 130/80 | HR 61 | Temp 98.3°F | Resp 18 | Wt 156.0 lb

## 2013-03-14 DIAGNOSIS — M549 Dorsalgia, unspecified: Secondary | ICD-10-CM

## 2013-03-14 DIAGNOSIS — I10 Essential (primary) hypertension: Secondary | ICD-10-CM

## 2013-03-14 DIAGNOSIS — E739 Lactose intolerance, unspecified: Secondary | ICD-10-CM

## 2013-03-14 LAB — HEMOGLOBIN A1C: Hgb A1c MFr Bld: 5.8 % (ref 4.6–6.5)

## 2013-03-14 MED ORDER — FLUTICASONE PROPIONATE 50 MCG/ACT NA SUSP: 1.0000 | Freq: Every day | NASAL | Status: DC

## 2013-03-14 NOTE — Progress Notes (Signed)
Subjective:    Patient ID: Rebecca Paul, female    DOB: 06-Nov-1955, 56 y.o.   MRN: [id number]  HPI  57 year old patient who has a history of impaired glucose tolerance who had a health screen at work that revealed a hemoglobin A1c of 7.3. When seen last year for a physical a fasting blood sugar was 99. She does have a family history of diabetes. She has a history of chronic low back pain and also describes some pain involving her left leg and toes. She was concerned about a possible diabetic complication. She does have treated hypertension which has been stable.  Past Medical History  Diagnosis Date  . Kidney stone   . Hyperlipidemia   . IGT (impaired glucose tolerance)   . Hypertension   . Headache(784.0)     History   Social History  . Marital Status: Married    Spouse Name: N/A    Number of Children: N/A  . Years of Education: N/A   Occupational History  . Not on file.   Social History Main Topics  . Smoking status: Never Smoker   . Smokeless tobacco: Never Used  . Alcohol Use: No  . Drug Use: No  . Sexually Active: Not on file   Other Topics Concern  . Not on file   Social History Narrative  . No narrative on file    Past Surgical History  Procedure Laterality Date  . Abdominal hysterectomy    . Tonsillectomy and adenoidectomy      Family History  Problem Relation Age of Onset  . Hypertension Mother   . Hyperlipidemia Mother   . Thyroid disease Mother   . Cancer Father     prostate  . Obesity Sister   . Diabetes Other   . Hyperlipidemia Other   . Hypertension Other   . Cancer Other     prostate  . Kidney disease Other   . Obesity Sister   . Heart attack Sister     No Known Allergies  Current Outpatient Prescriptions on File Prior to Visit  Medication Sig Dispense Refill  . cyclobenzaprine (FLEXERIL) 10 MG tablet Take 1 tablet (10 mg total) by mouth 3 (three) times daily as needed.  30 tablet  4  . famciclovir (FAMVIR) 250 MG tablet Take 1  tablet (250 mg total) by mouth 2 (two) times daily.  60 tablet  3  . losartan (COZAAR) 100 MG tablet Take 1 tablet (100 mg total) by mouth daily.  90 tablet  6   No current facility-administered medications on file prior to visit.    BP 130/80  Pulse 61  Temp(Src) 98.3 F (36.8 C) (Oral)  Resp 18  Wt 156 lb (70.761 kg)  BMI 29.49 kg/m2  SpO2 97%       Review of Systems  Constitutional: Negative.   HENT: Negative for hearing loss, congestion, sore throat, rhinorrhea, dental problem, sinus pressure and tinnitus.   Eyes: Negative for pain, discharge and visual disturbance.  Respiratory: Negative for cough and shortness of breath.   Cardiovascular: Negative for chest pain, palpitations and leg swelling.  Gastrointestinal: Negative for nausea, vomiting, abdominal pain, diarrhea, constipation, blood in stool and abdominal distention.  Genitourinary: Negative for dysuria, urgency, frequency, hematuria, flank pain, vaginal bleeding, vaginal discharge, difficulty urinating, vaginal pain and pelvic pain.  Musculoskeletal: Positive for back pain. Negative for joint swelling, arthralgias and gait problem.  Skin: Negative for rash.  Neurological: Negative for dizziness, syncope, speech difficulty, weakness, numbness and  headaches.  Hematological: Negative for adenopathy.  Psychiatric/Behavioral: Negative for behavioral problems, dysphoric mood and agitation. The patient is not nervous/anxious.        Objective:   Physical Exam  Constitutional: She appears well-developed and well-nourished. No distress.  Cardiovascular: Intact distal pulses.   Neurological:  Feet are intact to vibratory sensation and monofilament testing          Assessment & Plan:   Long history of impaired glucose tolerance. We'll check a hemoglobin A1c. If this is  abnormal this will confirm a diagnosis of diabetes. Will start on metformin therapy at that time. Lifestyle issues addressed Hypertension well  controlled  Check hemoglobin A1c Recheck 3 months

## 2013-03-14 NOTE — Patient Instructions (Addendum)
Limit your sodium (Salt) intake    It is important that you exercise regularly, at least 20 minutes 3 to 4 times per week.  If you develop chest pain or shortness of breath seek  medical attention. 

## 2013-06-15 ENCOUNTER — Ambulatory Visit (INDEPENDENT_AMBULATORY_CARE_PROVIDER_SITE_OTHER): Payer: BC Managed Care – PPO | Admitting: Internal Medicine

## 2013-06-15 ENCOUNTER — Encounter: Payer: Self-pay | Admitting: Internal Medicine

## 2013-06-15 VITALS — BP 128/80 | HR 63 | Temp 98.3°F | Resp 20 | Wt 160.0 lb

## 2013-06-15 DIAGNOSIS — I10 Essential (primary) hypertension: Secondary | ICD-10-CM

## 2013-06-15 DIAGNOSIS — Z23 Encounter for immunization: Secondary | ICD-10-CM

## 2013-06-15 DIAGNOSIS — J069 Acute upper respiratory infection, unspecified: Secondary | ICD-10-CM

## 2013-06-15 NOTE — Progress Notes (Signed)
Subjective:    Patient ID: Rebecca Paul, female    DOB: 1956-09-28, 57 y.o.   MRN: [id number]  HPI 57 year old patient who has a history of essential hypertension. While on a trip to a low status recently she developed a productive cough yielding some yellow mucus. Her period time it was blood-tinged but this has not recurred in about one week. She presents today with concerns about hemoptysis and persistent mild cough. She does have some postnasal drip and rhinorrhea. She has used Flonase in the past. No fever. She did have a chest x-ray in 2013.  Past Medical History  Diagnosis Date  . Kidney stone   . Hyperlipidemia   . IGT (impaired glucose tolerance)   . Hypertension   . Headache(784.0)     History   Social History  . Marital Status: Married    Spouse Name: N/A    Number of Children: N/A  . Years of Education: N/A   Occupational History  . Not on file.   Social History Main Topics  . Smoking status: Never Smoker   . Smokeless tobacco: Never Used  . Alcohol Use: No  . Drug Use: No  . Sexual Activity: Not on file   Other Topics Concern  . Not on file   Social History Narrative  . No narrative on file    Past Surgical History  Procedure Laterality Date  . Abdominal hysterectomy    . Tonsillectomy and adenoidectomy      Family History  Problem Relation Age of Onset  . Hypertension Mother   . Hyperlipidemia Mother   . Thyroid disease Mother   . Cancer Father     prostate  . Obesity Sister   . Diabetes Other   . Hyperlipidemia Other   . Hypertension Other   . Cancer Other     prostate  . Kidney disease Other   . Obesity Sister   . Heart attack Sister     No Known Allergies  Current Outpatient Prescriptions on File Prior to Visit  Medication Sig Dispense Refill  . famciclovir (FAMVIR) 250 MG tablet Take 1 tablet (250 mg total) by mouth 2 (two) times daily.  60 tablet  3  . fluticasone (FLONASE) 50 MCG/ACT nasal spray Place 1 spray into the nose  daily.  16 g  23  . losartan (COZAAR) 100 MG tablet Take 1 tablet (100 mg total) by mouth daily.  90 tablet  6   No current facility-administered medications on file prior to visit.    BP 128/80  Pulse 63  Temp(Src) 98.3 F (36.8 C) (Oral)  Resp 20  Wt 160 lb (72.576 kg)  BMI 30.25 kg/m2  SpO2 98%         Review of Systems  Constitutional: Negative.   HENT: Positive for congestion, rhinorrhea and postnasal drip. Negative for hearing loss, sore throat, dental problem, sinus pressure and tinnitus.   Eyes: Negative for pain, discharge and visual disturbance.  Respiratory: Positive for cough. Negative for shortness of breath.   Cardiovascular: Negative for chest pain, palpitations and leg swelling.  Gastrointestinal: Negative for nausea, vomiting, abdominal pain, diarrhea, constipation, blood in stool and abdominal distention.  Genitourinary: Negative for dysuria, urgency, frequency, hematuria, flank pain, vaginal bleeding, vaginal discharge, difficulty urinating, vaginal pain and pelvic pain.  Musculoskeletal: Negative for joint swelling, arthralgias and gait problem.  Skin: Negative for rash.  Neurological: Negative for dizziness, syncope, speech difficulty, weakness, numbness and headaches.  Hematological: Negative for adenopathy.  Psychiatric/Behavioral: Negative for behavioral problems, dysphoric mood and agitation. The patient is not nervous/anxious.        Objective:   Physical Exam  Constitutional: She is oriented to person, place, and time. She appears well-developed and well-nourished.  HENT:  Head: Normocephalic.  Right Ear: External ear normal.  Left Ear: External ear normal.  Mouth/Throat: Oropharynx is clear and moist.  Eyes: Conjunctivae and EOM are normal. Pupils are equal, round, and reactive to light.  Neck: Normal range of motion. Neck supple. No thyromegaly present.  Cardiovascular: Normal rate, regular rhythm, normal heart sounds and intact distal  pulses.   Pulmonary/Chest: Effort normal and breath sounds normal.  Abdominal: Soft. Bowel sounds are normal. She exhibits no mass. There is no tenderness.  Musculoskeletal: Normal range of motion.  Lymphadenopathy:    She has no cervical adenopathy.  Neurological: She is alert and oriented to person, place, and time.  Skin: Skin is warm and dry. No rash noted.  Psychiatric: She has a normal mood and affect. Her behavior is normal.          Assessment & Plan:  Resolving bronchitis. Mild self-limiting hemoptysis related to the bronchitis  We'll treat symptomatically We'll call if hemoptysis recurs

## 2013-06-15 NOTE — Patient Instructions (Addendum)
Take over-the-counter expectorants and cough medications such as  Mucinex DM.  Call if there is no improvement in 5 to 7 days or if he developed worsening cough, fever, or new symptoms, such as shortness of breath or chest pain.  Limit your sodium (Salt) intake  Please check your blood pressure on a regular basis.  If it is consistently greater than 150/90, please make an office appointment.    It is important that you exercise regularly, at least 20 minutes 3 to 4 times per week.  If you develop chest pain or shortness of breath seek  medical attention.

## 2013-07-04 ENCOUNTER — Other Ambulatory Visit: Payer: Self-pay | Admitting: Internal Medicine

## 2013-08-01 ENCOUNTER — Telehealth: Payer: Self-pay | Admitting: Internal Medicine

## 2013-08-01 NOTE — Telephone Encounter (Signed)
Caller: Shalva/Patient; Phone: [phone]; Reason for Call: Eye Injury after scratching Cornea while removing Contact on 10-15.  Pt would like to know if office can exam her Eye and treat or is it better for her to see Opthamologist.   Consulted w/ Tim Lair at office, RN at office agreed, Pt should go to Opthamologist or Urgent care.  Pt will see her Opthamologist.

## 2013-08-06 ENCOUNTER — Ambulatory Visit (INDEPENDENT_AMBULATORY_CARE_PROVIDER_SITE_OTHER): Payer: BC Managed Care – PPO | Admitting: *Deleted

## 2013-08-06 DIAGNOSIS — Z23 Encounter for immunization: Secondary | ICD-10-CM

## 2013-08-13 ENCOUNTER — Ambulatory Visit (INDEPENDENT_AMBULATORY_CARE_PROVIDER_SITE_OTHER): Payer: BC Managed Care – PPO | Admitting: *Deleted

## 2013-08-13 DIAGNOSIS — Z23 Encounter for immunization: Secondary | ICD-10-CM

## 2013-12-24 ENCOUNTER — Encounter: Payer: Self-pay | Admitting: Family Medicine

## 2013-12-24 ENCOUNTER — Ambulatory Visit (INDEPENDENT_AMBULATORY_CARE_PROVIDER_SITE_OTHER): Payer: BC Managed Care – PPO | Admitting: Family Medicine

## 2013-12-24 VITALS — BP 142/74 | Temp 98.0°F | Ht 61.0 in | Wt 160.0 lb

## 2013-12-24 DIAGNOSIS — I10 Essential (primary) hypertension: Secondary | ICD-10-CM

## 2013-12-24 MED ORDER — LOSARTAN POTASSIUM-HCTZ 100-25 MG PO TABS: 1.0000 | ORAL_TABLET | Freq: Every day | ORAL | Status: DC

## 2013-12-24 NOTE — Progress Notes (Signed)
Pre visit review using our clinic review tool, if applicable. No additional management support is needed unless otherwise documented below in the visit note. 

## 2013-12-24 NOTE — Progress Notes (Signed)
   Subjective:    Patient ID: Rebecca Paul, female    DOB: May 02, 1956, 58 y.o.   MRN: [id number]  HPI Here with concerns over her BP. Over the past few months her BP at home has gone up the 569V or 948A systolic over 16P, and she feels tired and has some mild headaches at times. She has gained about 8 lbs in the past year.    Review of Systems  Constitutional: Negative.   Respiratory: Negative.   Cardiovascular: Negative.   Neurological: Positive for headaches.       Objective:   Physical Exam  Constitutional: She is oriented to person, place, and time. She appears well-developed and well-nourished.  Cardiovascular: Normal rate, regular rhythm, normal heart sounds and intact distal pulses.   Pulmonary/Chest: Effort normal and breath sounds normal.  Neurological: She is alert and oriented to person, place, and time.          Assessment & Plan:  We will add HCTZ to her Losartan. We discussed diet and exercise. She has a cpx with Dr. Burnice Logan in 2 weeks and this will be a good time to follow up.

## 2013-12-25 ENCOUNTER — Telehealth: Payer: Self-pay | Admitting: Internal Medicine

## 2013-12-25 NOTE — Telephone Encounter (Signed)
Relevant patient education assigned to patient using Emmi. ° °

## 2013-12-31 ENCOUNTER — Other Ambulatory Visit (INDEPENDENT_AMBULATORY_CARE_PROVIDER_SITE_OTHER): Payer: BC Managed Care – PPO

## 2013-12-31 DIAGNOSIS — Z0000 Encounter for general adult medical examination without abnormal findings: Secondary | ICD-10-CM

## 2013-12-31 LAB — CBC WITH DIFFERENTIAL/PLATELET
BASOS PCT: 0.5 % (ref 0.0–3.0)
Basophils Absolute: 0 10*3/uL (ref 0.0–0.1)
EOS PCT: 2 % (ref 0.0–5.0)
Eosinophils Absolute: 0.2 10*3/uL (ref 0.0–0.7)
HEMATOCRIT: 39.1 % (ref 36.0–46.0)
Hemoglobin: 13.2 g/dL (ref 12.0–15.0)
Lymphocytes Relative: 27.5 % (ref 12.0–46.0)
Lymphs Abs: 2.2 10*3/uL (ref 0.7–4.0)
MCHC: 33.9 g/dL (ref 30.0–36.0)
MCV: 81.7 fl (ref 78.0–100.0)
Monocytes Absolute: 0.8 10*3/uL (ref 0.1–1.0)
Monocytes Relative: 9.6 % (ref 3.0–12.0)
NEUTROS PCT: 60.4 % (ref 43.0–77.0)
Neutro Abs: 4.9 10*3/uL (ref 1.4–7.7)
Platelets: 266 10*3/uL (ref 150.0–400.0)
RBC: 4.78 Mil/uL (ref 3.87–5.11)
RDW: 14.1 % (ref 11.5–14.6)
WBC: 8.1 10*3/uL (ref 4.5–10.5)

## 2013-12-31 LAB — HEPATIC FUNCTION PANEL
ALBUMIN: 4.2 g/dL (ref 3.5–5.2)
ALT: 15 U/L (ref 0–35)
AST: 15 U/L (ref 0–37)
Alkaline Phosphatase: 49 U/L (ref 39–117)
Bilirubin, Direct: 0 mg/dL (ref 0.0–0.3)
TOTAL PROTEIN: 8.2 g/dL (ref 6.0–8.3)
Total Bilirubin: 0.4 mg/dL (ref 0.3–1.2)

## 2013-12-31 LAB — BASIC METABOLIC PANEL
BUN: 19 mg/dL (ref 6–23)
CHLORIDE: 104 meq/L (ref 96–112)
CO2: 28 mEq/L (ref 19–32)
Calcium: 10.4 mg/dL (ref 8.4–10.5)
Creatinine, Ser: 0.9 mg/dL (ref 0.4–1.2)
GFR: 84.16 mL/min (ref >60.00)
Glucose, Bld: 109 mg/dL — ABNORMAL HIGH (ref 70–99)
POTASSIUM: 3.8 meq/L (ref 3.5–5.1)
Sodium: 138 mEq/L (ref 135–145)

## 2013-12-31 LAB — POCT URINALYSIS DIPSTICK
Bilirubin, UA: NEGATIVE
Glucose, UA: NEGATIVE
Ketones, UA: NEGATIVE
Leukocytes, UA: NEGATIVE
Nitrite, UA: NEGATIVE
PH UA: 5.5
Protein, UA: NEGATIVE
SPEC GRAV UA: 1.025
UROBILINOGEN UA: 0.2

## 2013-12-31 LAB — TSH: TSH: 0.77 u[IU]/mL (ref 0.35–5.50)

## 2013-12-31 LAB — LIPID PANEL
CHOLESTEROL: 232 mg/dL — AB (ref 0–200)
HDL: 44.4 mg/dL (ref >39.00)
LDL CALC: 163 mg/dL — AB (ref 0–99)
TRIGLYCERIDES: 122 mg/dL (ref 0.0–149.0)
Total CHOL/HDL Ratio: 5
VLDL: 24.4 mg/dL (ref 0.0–40.0)

## 2014-01-07 ENCOUNTER — Ambulatory Visit (INDEPENDENT_AMBULATORY_CARE_PROVIDER_SITE_OTHER): Payer: BC Managed Care – PPO | Admitting: Internal Medicine

## 2014-01-07 ENCOUNTER — Encounter: Payer: Self-pay | Admitting: Internal Medicine

## 2014-01-07 VITALS — BP 110/68 | HR 72 | Temp 97.3°F | Resp 20 | Ht 60.5 in | Wt 161.0 lb

## 2014-01-07 DIAGNOSIS — I10 Essential (primary) hypertension: Secondary | ICD-10-CM

## 2014-01-07 DIAGNOSIS — E739 Lactose intolerance, unspecified: Secondary | ICD-10-CM

## 2014-01-07 NOTE — Progress Notes (Signed)
Subjective:    Patient ID: Rebecca Paul, female    DOB: January 04, 1956, 58 y.o.   MRN: [id number]  HPI  58 year-old patient who is seen today for her annual health exam. She is followed by gynecology medical problems include hypertension dyslipidemia and mild impaired glucose tolerance. Doing quite well today without concerns or complaints. She was seen recently due to 2 mild blood pressure elevations.  His current Dyazide was added to her regimen with much improved readings.  She feels well today.  Past Medical History  Diagnosis Date  . Kidney stone   . Hyperlipidemia   . IGT (impaired glucose tolerance)   . Hypertension   . Headache(784.0)     History   Social History  . Marital Status: Married    Spouse Name: N/A    Number of Children: N/A  . Years of Education: N/A   Occupational History  . Not on file.   Social History Main Topics  . Smoking status: Never Smoker   . Smokeless tobacco: Never Used  . Alcohol Use: No  . Drug Use: No  . Sexual Activity: Not on file   Other Topics Concern  . Not on file   Social History Narrative  . No narrative on file    Past Surgical History  Procedure Laterality Date  . Abdominal hysterectomy    . Tonsillectomy and adenoidectomy      Family History  Problem Relation Age of Onset  . Hypertension Mother   . Hyperlipidemia Mother   . Thyroid disease Mother   . Cancer Father     prostate  . Obesity Sister   . Diabetes Other   . Hyperlipidemia Other   . Hypertension Other   . Cancer Other     prostate  . Kidney disease Other   . Obesity Sister   . Heart attack Sister     No Known Allergies  Current Outpatient Prescriptions on File Prior to Visit  Medication Sig Dispense Refill  . fluticasone (FLONASE) 50 MCG/ACT nasal spray Place 1 spray into the nose daily.  16 g  23  . losartan-hydrochlorothiazide (HYZAAR) 100-25 MG per tablet Take 1 tablet by mouth daily.  30 tablet  0   No current facility-administered  medications on file prior to visit.    BP 110/68  Pulse 72  Temp(Src) 97.3 F (36.3 C) (Oral)  Resp 20  Ht 5' 0.5" (1.537 m)  Wt 161 lb (73.029 kg)  BMI 30.91 kg/m2  SpO2 98%       Review of Systems  Constitutional: Negative for fever, appetite change, fatigue and unexpected weight change.  HENT: Negative for congestion, dental problem, ear pain, hearing loss, mouth sores, nosebleeds, sinus pressure, sore throat, tinnitus, trouble swallowing and voice change.   Eyes: Negative for photophobia, pain, redness and visual disturbance.  Respiratory: Negative for cough, chest tightness and shortness of breath.   Cardiovascular: Negative for chest pain, palpitations and leg swelling.  Gastrointestinal: Negative for nausea, vomiting, abdominal pain, diarrhea, constipation, blood in stool, abdominal distention and rectal pain.  Genitourinary: Negative for dysuria, urgency, frequency, hematuria, flank pain, vaginal bleeding, vaginal discharge, difficulty urinating, genital sores, vaginal pain, menstrual problem and pelvic pain.  Musculoskeletal: Negative for arthralgias, back pain and neck stiffness.  Skin: Negative for rash.  Neurological: Negative for dizziness, syncope, speech difficulty, weakness, light-headedness, numbness and headaches.  Hematological: Negative for adenopathy. Does not bruise/bleed easily.  Psychiatric/Behavioral: Negative for suicidal ideas, behavioral problems, self-injury, dysphoric mood and  agitation. The patient is not nervous/anxious.        Objective:   Physical Exam  Constitutional: She is oriented to person, place, and time. She appears well-developed and well-nourished.  HENT:  Head: Normocephalic and atraumatic.  Right Ear: External ear normal.  Left Ear: External ear normal.  Mouth/Throat: Oropharynx is clear and moist.  Eyes: Conjunctivae and EOM are normal.  Neck: Normal range of motion. Neck supple. No JVD present. No thyromegaly present.   Cardiovascular: Normal rate, regular rhythm, normal heart sounds and intact distal pulses.   No murmur heard. Pulmonary/Chest: Effort normal and breath sounds normal. She has no wheezes. She has no rales.  Abdominal: Soft. Bowel sounds are normal. She exhibits no distension and no mass. There is no tenderness. There is no rebound and no guarding.  Musculoskeletal: Normal range of motion. She exhibits no edema and no tenderness.  Neurological: She is alert and oriented to person, place, and time. She has normal reflexes. No cranial nerve deficit. She exhibits normal muscle tone. Coordination normal.  Skin: Skin is warm and dry. No rash noted.  Psychiatric: She has a normal mood and affect. Her behavior is normal.          Assessment & Plan:   Preventive health examination Hypertension stable Mild dyslipidemia History of impaired glucose tolerance  Medical regimen unchanged Lifestyle issues discussed Recheck 12 months Home blood pressure monitoring encouraged

## 2014-01-07 NOTE — Patient Instructions (Signed)
Please check your blood pressure on a regular basis.  If it is consistently greater than 150/90, please make an office appointment.  Limit your sodium (Salt) intake    It is important that you exercise regularly, at least 20 minutes 3 to 4 times per week.  If you develop chest pain or shortness of breath seek  medical attention.  Return in one year for follow-up  

## 2014-01-07 NOTE — Progress Notes (Signed)
Pre-visit discussion using our clinic review tool. No additional management support is needed unless otherwise documented below in the visit note.  

## 2014-01-21 ENCOUNTER — Other Ambulatory Visit: Payer: Self-pay | Admitting: Family Medicine

## 2014-01-28 ENCOUNTER — Telehealth: Payer: Self-pay | Admitting: Internal Medicine

## 2014-01-28 NOTE — Telephone Encounter (Signed)
Pt would like to see Padonda for a female issue, yeast infection.  Is it okay to schedule with Padonda??

## 2014-01-28 NOTE — Telephone Encounter (Signed)
LMOM for pt to call office and schedule appointment with Padonda. Okay per both providers.

## 2014-01-28 NOTE — Telephone Encounter (Signed)
yes

## 2014-01-28 NOTE — Telephone Encounter (Signed)
Ok with me 

## 2014-01-29 ENCOUNTER — Ambulatory Visit (INDEPENDENT_AMBULATORY_CARE_PROVIDER_SITE_OTHER): Payer: BC Managed Care – PPO | Admitting: Family

## 2014-01-29 ENCOUNTER — Other Ambulatory Visit (HOSPITAL_COMMUNITY)
Admission: RE | Admit: 2014-01-29 | Discharge: 2014-01-29 | Disposition: A | Discharge status: Home / Self Care | Payer: BC Managed Care – PPO | Source: Ambulatory Visit | Attending: Family | Admitting: Family

## 2014-01-29 ENCOUNTER — Encounter: Payer: Self-pay | Admitting: Family

## 2014-01-29 VITALS — BP 110/70 | HR 86 | Temp 98.5°F | Wt 162.0 lb

## 2014-01-29 DIAGNOSIS — Z113 Encounter for screening for infections with a predominantly sexual mode of transmission: Secondary | ICD-10-CM | POA: Insufficient documentation

## 2014-01-29 DIAGNOSIS — N760 Acute vaginitis: Secondary | ICD-10-CM | POA: Insufficient documentation

## 2014-01-29 DIAGNOSIS — N898 Other specified noninflammatory disorders of vagina: Secondary | ICD-10-CM

## 2014-01-29 LAB — POCT URINALYSIS DIPSTICK
Bilirubin, UA: NEGATIVE
Glucose, UA: NEGATIVE
Ketones, UA: NEGATIVE
LEUKOCYTES UA: NEGATIVE
NITRITE UA: NEGATIVE
PH UA: 6.5
PROTEIN UA: NEGATIVE
Spec Grav, UA: 1.02
UROBILINOGEN UA: 1

## 2014-01-29 LAB — CERVICOVAGINAL ANCILLARY ONLY
WET PREP (BD AFFIRM): NEGATIVE
Wet Prep (BD Affirm): NEGATIVE
Wet Prep (BD Affirm): NEGATIVE

## 2014-01-29 NOTE — Progress Notes (Signed)
Pre visit review using our clinic review tool, if applicable. No additional management support is needed unless otherwise documented below in the visit note. 

## 2014-01-30 ENCOUNTER — Encounter: Payer: Self-pay | Admitting: Family

## 2014-01-30 LAB — GC/CHLAMYDIA PROBE AMP
CT Probe RNA: NEGATIVE
GC Probe RNA: NEGATIVE

## 2014-01-30 LAB — CERVICOVAGINAL ANCILLARY ONLY
Chlamydia: NEGATIVE
Neisseria Gonorrhea: NEGATIVE

## 2014-01-30 NOTE — Addendum Note (Signed)
Addended by: Santiago Bumpers on: 01/30/2014 08:52 AM   Modules accepted: Orders

## 2014-01-30 NOTE — Progress Notes (Signed)
Subjective:    Patient ID: Rebecca Paul, female    DOB: 27-Jul-1956, 58 y.o.   MRN: [id number]  HPI 58 year old AAF, nonsmoker is in today is in requesting STD testing. Reports she has been having vaginal irritation. Has been having oral sex but no intercourse. Declines herpes and HIV testing.   Review of Systems  Constitutional: Negative.   Respiratory: Negative.   Cardiovascular: Negative.   Genitourinary: Positive for vaginal discharge. Negative for vaginal bleeding and vaginal pain.  Musculoskeletal: Negative.   Skin: Negative.   Neurological: Negative.   Psychiatric/Behavioral: Negative.    Past Medical History  Diagnosis Date  . Kidney stone   . Hyperlipidemia   . IGT (impaired glucose tolerance)   . Hypertension   . Headache(784.0)     History   Social History  . Marital Status: Married    Spouse Name: N/A    Number of Children: N/A  . Years of Education: N/A   Occupational History  . Not on file.   Social History Main Topics  . Smoking status: Never Smoker   . Smokeless tobacco: Never Used  . Alcohol Use: No  . Drug Use: No  . Sexual Activity: Not on file   Other Topics Concern  . Not on file   Social History Narrative  . No narrative on file    Past Surgical History  Procedure Laterality Date  . Abdominal hysterectomy    . Tonsillectomy and adenoidectomy      Family History  Problem Relation Age of Onset  . Hypertension Mother   . Hyperlipidemia Mother   . Thyroid disease Mother   . Cancer Father     prostate  . Obesity Sister   . Diabetes Other   . Hyperlipidemia Other   . Hypertension Other   . Cancer Other     prostate  . Kidney disease Other   . Obesity Sister   . Heart attack Sister     No Known Allergies  Current Outpatient Prescriptions on File Prior to Visit  Medication Sig Dispense Refill  . famciclovir (FAMVIR) 250 MG tablet Take 250 mg by mouth 2 (two) times daily as needed.      . fluticasone (FLONASE) 50 MCG/ACT  nasal spray Place 1 spray into the nose daily.  16 g  23  . losartan-hydrochlorothiazide (HYZAAR) 100-25 MG per tablet TAKE 1 TABLET BY MOUTH EVERY DAY  30 tablet  5   No current facility-administered medications on file prior to visit.    BP 110/70  Pulse 86  Temp(Src) 98.5 F (36.9 C) (Oral)  Wt 162 lb (73.483 kg)chart    Objective:   Physical Exam  Constitutional: She is oriented to person, place, and time. She appears well-developed and well-nourished.  Neck: Normal range of motion. Neck supple.  Cardiovascular: Normal rate, regular rhythm and normal heart sounds.   Pulmonary/Chest: Effort normal and breath sounds normal.  Genitourinary: Vagina normal. Guaiac negative stool. No vaginal discharge found.  Neurological: She is alert and oriented to person, place, and time.  Skin: Skin is warm and dry.  Psychiatric: She has a normal mood and affect.          Assessment & Plan:  Rebecca Paul was seen today for vaginitis and requesting std testing.  Diagnoses and associated orders for this visit:  Vaginal discharge - POCT urinalysis dipstick - Cancel: GC/chlamydia probe amp, genital - Cancel: WET PREP FOR TRICH, YEAST, CLUE - Cervicovaginal ancillary only  Other  Orders - GC/Chlamydia Probe Amp   Call the office if with any questions or concerns. Recheck as scheduled and as needed.

## 2014-01-30 NOTE — Addendum Note (Signed)
Addended by: Santiago Bumpers on: 01/30/2014 10:26 AM   Modules accepted: Orders

## 2014-03-08 ENCOUNTER — Telehealth: Payer: Self-pay | Admitting: Internal Medicine

## 2014-03-08 NOTE — Telephone Encounter (Signed)
Pt would like you to send her a copy of her last cpe labs. Pt cannot come by here and pu. Do you mind? Pt refused to sign up for mychart, pt states a fear of someone hacking her info.

## 2014-03-08 NOTE — Telephone Encounter (Signed)
Copy mailed per patient

## 2014-04-04 ENCOUNTER — Telehealth: Payer: Self-pay | Admitting: Internal Medicine

## 2014-04-04 NOTE — Telephone Encounter (Signed)
Pt would like a call about a physical she had in March

## 2014-04-04 NOTE — Telephone Encounter (Signed)
Left message on voicemail to call office.  

## 2014-04-08 NOTE — Telephone Encounter (Signed)
Left message on voicemail to call office.  

## 2014-04-09 NOTE — Telephone Encounter (Signed)
Left numerous messages and pt has not returned my call.

## 2014-04-18 ENCOUNTER — Telehealth: Payer: Self-pay | Admitting: *Deleted

## 2014-04-18 NOTE — Telephone Encounter (Signed)
Left message on voicemail to call office.  

## 2014-04-23 ENCOUNTER — Encounter: Payer: Self-pay | Admitting: Internal Medicine

## 2014-04-23 ENCOUNTER — Ambulatory Visit (INDEPENDENT_AMBULATORY_CARE_PROVIDER_SITE_OTHER): Payer: BC Managed Care – PPO | Admitting: Internal Medicine

## 2014-04-23 VITALS — BP 120/80 | HR 77 | Temp 99.0°F | Resp 20 | Ht 60.5 in | Wt 158.0 lb

## 2014-04-23 DIAGNOSIS — I10 Essential (primary) hypertension: Secondary | ICD-10-CM

## 2014-04-23 DIAGNOSIS — M545 Low back pain, unspecified: Secondary | ICD-10-CM

## 2014-04-23 DIAGNOSIS — E739 Lactose intolerance, unspecified: Secondary | ICD-10-CM

## 2014-04-23 LAB — HEMOGLOBIN A1C: Hgb A1c MFr Bld: 6 % (ref 4.6–6.5)

## 2014-04-23 NOTE — Progress Notes (Signed)
Pre visit review using our clinic review tool, if applicable. No additional management support is needed unless otherwise documented below in the visit note. 

## 2014-04-23 NOTE — Progress Notes (Signed)
Subjective:    Patient ID: Rebecca Paul, female    DOB: [birth year], 58 y.o.   MRN: [id number]  HPI   58 year old patient who has a history of hypertension, and impaired glucose tolerance.  The past 2 weeks.  She has had some intermittent headaches, lightheadedness, and fatigue.  She generally feels unwell.  She has some mild right-sided lower lumbar pain. She has been under considerable situational stress due to the death of her husband from an acute MI.  4 days ago.  Past Medical History  Diagnosis Date  . Kidney stone   . Hyperlipidemia   . IGT (impaired glucose tolerance)   . Hypertension   . Headache(784.0)     History   Social History  . Marital Status: Married    Spouse Name: N/A    Number of Children: N/A  . Years of Education: N/A   Occupational History  . Not on file.   Social History Main Topics  . Smoking status: Never Smoker   . Smokeless tobacco: Never Used  . Alcohol Use: No  . Drug Use: No  . Sexual Activity: Not on file   Other Topics Concern  . Not on file   Social History Narrative  . No narrative on file    Past Surgical History  Procedure Laterality Date  . Abdominal hysterectomy    . Tonsillectomy and adenoidectomy      Family History  Problem Relation Age of Onset  . Hypertension Mother   . Hyperlipidemia Mother   . Thyroid disease Mother   . Cancer Father     prostate  . Obesity Sister   . Diabetes Other   . Hyperlipidemia Other   . Hypertension Other   . Cancer Other     prostate  . Kidney disease Other   . Obesity Sister   . Heart attack Sister     No Known Allergies  Current Outpatient Prescriptions on File Prior to Visit  Medication Sig Dispense Refill  . famciclovir (FAMVIR) 250 MG tablet Take 250 mg by mouth 2 (two) times daily as needed.      Marland Kitchen losartan-hydrochlorothiazide (HYZAAR) 100-25 MG per tablet TAKE 1 TABLET BY MOUTH EVERY DAY  30 tablet  5  . fluticasone (FLONASE) 50 MCG/ACT nasal spray Place 1 spray  into the nose daily.  16 g  23   No current facility-administered medications on file prior to visit.    BP 120/80  Pulse 77  Temp(Src) 99 F (37.2 C) (Oral)  Resp 20  Ht 5' 0.5" (1.537 m)  Wt 158 lb (71.668 kg)  BMI 30.34 kg/m2  SpO2 97%       Review of Systems  Constitutional: Positive for fatigue.  HENT: Negative for congestion, dental problem, hearing loss, rhinorrhea, sinus pressure, sore throat and tinnitus.   Eyes: Negative for pain, discharge and visual disturbance.  Respiratory: Negative for cough and shortness of breath.   Cardiovascular: Negative for chest pain, palpitations and leg swelling.  Gastrointestinal: Negative for nausea, vomiting, abdominal pain, diarrhea, constipation, blood in stool and abdominal distention.  Genitourinary: Negative for dysuria, urgency, frequency, hematuria, flank pain, vaginal bleeding, vaginal discharge, difficulty urinating, vaginal pain and pelvic pain.  Musculoskeletal: Positive for back pain. Negative for arthralgias, gait problem and joint swelling.  Skin: Negative for rash.  Neurological: Negative for dizziness, syncope, speech difficulty, weakness, numbness and headaches.  Hematological: Negative for adenopathy.  Psychiatric/Behavioral: Negative for behavioral problems, dysphoric mood and agitation. The patient is  not nervous/anxious.        Objective:   Physical Exam  Constitutional: She is oriented to person, place, and time. She appears well-developed and well-nourished.  HENT:  Head: Normocephalic.  Right Ear: External ear normal.  Left Ear: External ear normal.  Mouth/Throat: Oropharynx is clear and moist.  Eyes: Conjunctivae and EOM are normal. Pupils are equal, round, and reactive to light.  Neck: Normal range of motion. Neck supple. No thyromegaly present.  Cardiovascular: Normal rate, regular rhythm, normal heart sounds and intact distal pulses.   Pulmonary/Chest: Effort normal and breath sounds normal.    Abdominal: Soft. Bowel sounds are normal. She exhibits no mass. There is no tenderness.  Musculoskeletal: Normal range of motion.  Lymphadenopathy:    She has no cervical adenopathy.  Neurological: She is alert and oriented to person, place, and time.  Skin: Skin is warm and dry. No rash noted.  Psychiatric: She has a normal mood and affect. Her behavior is normal.          Assessment & Plan:  Fatigue Situational stress Hypertension well controlled Impaired glucose tolerance.  We'll check a hemoglobin A1c

## 2014-05-07 ENCOUNTER — Telehealth: Payer: Self-pay | Admitting: *Deleted

## 2014-05-07 NOTE — Telephone Encounter (Signed)
Pt called for result of Hemoglobin A1c,  Left message to call office on Mobile.

## 2014-05-08 NOTE — Telephone Encounter (Signed)
Spoke to pt told her Hemoglobin A1c came back at 6.0 normal range. Pt verbalized understanding.

## 2014-06-18 ENCOUNTER — Encounter: Payer: Self-pay | Admitting: Internal Medicine

## 2014-07-17 ENCOUNTER — Ambulatory Visit (INDEPENDENT_AMBULATORY_CARE_PROVIDER_SITE_OTHER): Payer: BC Managed Care – PPO | Admitting: Internal Medicine

## 2014-07-17 ENCOUNTER — Encounter: Payer: Self-pay | Admitting: Internal Medicine

## 2014-07-17 VITALS — BP 120/80 | HR 97 | Temp 98.4°F | Resp 20 | Ht 60.5 in | Wt 156.0 lb

## 2014-07-17 DIAGNOSIS — I10 Essential (primary) hypertension: Secondary | ICD-10-CM

## 2014-07-17 DIAGNOSIS — J069 Acute upper respiratory infection, unspecified: Secondary | ICD-10-CM

## 2014-07-17 MED ORDER — HYDROCODONE-HOMATROPINE 5-1.5 MG/5ML PO SYRP: 5.0000 mL | ORAL_SOLUTION | Freq: Four times a day (QID) | ORAL | Status: DC | PRN

## 2014-07-17 NOTE — Progress Notes (Signed)
Subjective:    Patient ID: Rebecca Paul, female    DOB: 10/27/1955, 58 y.o.   MRN: [id number]  HPI  Wt Readings from Last 3 Encounters:  07/17/14 156 lb (70.761 kg)  04/23/14 158 lb (71.668 kg)  01/29/14 162 lb (73.42 kg)   58 year old female, who presents with a several day history of sinus and chest congestion and cough.  Cough has been minimally productive.  No fever, shortness or breath or wheezing.  Past Medical History  Diagnosis Date  . Kidney stone   . Hyperlipidemia   . IGT (impaired glucose tolerance)   . Hypertension   . Headache(784.0)     History   Social History  . Marital Status: Married    Spouse Name: N/A    Number of Children: N/A  . Years of Education: N/A   Occupational History  . Not on file.   Social History Main Topics  . Smoking status: Never Smoker   . Smokeless tobacco: Never Used  . Alcohol Use: No  . Drug Use: No  . Sexual Activity: Not on file   Other Topics Concern  . Not on file   Social History Narrative  . No narrative on file    Past Surgical History  Procedure Laterality Date  . Abdominal hysterectomy    . Tonsillectomy and adenoidectomy      Family History  Problem Relation Age of Onset  . Hypertension Mother   . Hyperlipidemia Mother   . Thyroid disease Mother   . Cancer Father     prostate  . Obesity Sister   . Diabetes Other   . Hyperlipidemia Other   . Hypertension Other   . Cancer Other     prostate  . Kidney disease Other   . Obesity Sister   . Heart attack Sister     No Known Allergies  Current Outpatient Prescriptions on File Prior to Visit  Medication Sig Dispense Refill  . famciclovir (FAMVIR) 250 MG tablet Take 250 mg by mouth 2 (two) times daily as needed.      Marland Kitchen losartan-hydrochlorothiazide (HYZAAR) 100-25 MG per tablet TAKE 1 TABLET BY MOUTH EVERY DAY  30 tablet  5   No current facility-administered medications on file prior to visit.    BP 120/80  Pulse 97  Temp(Src) 98.4 F  (36.9 C) (Oral)  Resp 20  Ht 5' 0.5" (1.537 m)  Wt 156 lb (70.761 kg)  BMI 29.95 kg/m2  SpO2 96%    Review of Systems  Constitutional: Positive for activity change, appetite change and fatigue.  HENT: Positive for congestion and postnasal drip. Negative for dental problem, hearing loss, rhinorrhea, sinus pressure, sore throat and tinnitus.   Eyes: Negative for pain, discharge and visual disturbance.  Respiratory: Positive for cough. Negative for shortness of breath.   Cardiovascular: Negative for chest pain, palpitations and leg swelling.  Gastrointestinal: Negative for nausea, vomiting, abdominal pain, diarrhea, constipation, blood in stool and abdominal distention.  Genitourinary: Negative for dysuria, urgency, frequency, hematuria, flank pain, vaginal bleeding, vaginal discharge, difficulty urinating, vaginal pain and pelvic pain.  Musculoskeletal: Negative for arthralgias, gait problem and joint swelling.  Skin: Negative for rash.  Neurological: Negative for dizziness, syncope, speech difficulty, weakness, numbness and headaches.  Hematological: Negative for adenopathy.  Psychiatric/Behavioral: Negative for behavioral problems, dysphoric mood and agitation. The patient is not nervous/anxious.        Objective:   Physical Exam  Constitutional: She is oriented to person, place, and time.  She appears well-developed and well-nourished.  HENT:  Head: Normocephalic.  Right Ear: External ear normal.  Left Ear: External ear normal.  Mouth/Throat: Oropharynx is clear and moist.  Eyes: Conjunctivae and EOM are normal. Pupils are equal, round, and reactive to light.  Neck: Normal range of motion. Neck supple. No thyromegaly present.  Cardiovascular: Normal rate, regular rhythm, normal heart sounds and intact distal pulses.   Pulmonary/Chest: Effort normal and breath sounds normal.  Abdominal: Soft. Bowel sounds are normal. She exhibits no mass. There is no tenderness.    Musculoskeletal: Normal range of motion.  Lymphadenopathy:    She has no cervical adenopathy.  Neurological: She is alert and oriented to person, place, and time.  Skin: Skin is warm and dry. No rash noted.  Psychiatric: She has a normal mood and affect. Her behavior is normal.          Assessment & Plan:   Viral URI with cough.  We'll treat symptomatically Hypertension stable

## 2014-07-17 NOTE — Progress Notes (Signed)
Pre visit review using our clinic review tool, if applicable. No additional management support is needed unless otherwise documented below in the visit note. 

## 2014-07-17 NOTE — Patient Instructions (Signed)
Acute bronchitis symptoms for less than 10 days are generally not helped by antibiotics.  Take over-the-counter expectorants and cough medications such as  Mucinex DM.  Call if there is no improvement in 5 to 7 days or if  you develop worsening cough, fever, or new symptoms, such as shortness of breath or chest pain.   

## 2014-07-29 ENCOUNTER — Other Ambulatory Visit: Payer: Self-pay | Admitting: Internal Medicine

## 2014-09-02 ENCOUNTER — Other Ambulatory Visit: Payer: Self-pay | Admitting: Obstetrics & Gynecology

## 2014-09-03 LAB — CYTOLOGY - PAP

## 2014-12-17 ENCOUNTER — Ambulatory Visit (INDEPENDENT_AMBULATORY_CARE_PROVIDER_SITE_OTHER): Payer: BLUE CROSS/BLUE SHIELD | Admitting: Family

## 2014-12-17 ENCOUNTER — Encounter: Payer: Self-pay | Admitting: Family

## 2014-12-17 VITALS — BP 120/70 | HR 103 | Temp 98.6°F | Wt 160.5 lb

## 2014-12-17 DIAGNOSIS — R0982 Postnasal drip: Secondary | ICD-10-CM

## 2014-12-17 DIAGNOSIS — J029 Acute pharyngitis, unspecified: Secondary | ICD-10-CM

## 2014-12-17 DIAGNOSIS — J309 Allergic rhinitis, unspecified: Secondary | ICD-10-CM

## 2014-12-17 LAB — POCT RAPID STREP A (OFFICE): Rapid Strep A Screen: NEGATIVE

## 2014-12-17 NOTE — Progress Notes (Signed)
Pre visit review using our clinic review tool, if applicable. No additional management support is needed unless otherwise documented below in the visit note. 

## 2014-12-17 NOTE — Progress Notes (Signed)
Subjective:    Patient ID: Rebecca Paul, female    DOB: 07/25/1956, 60 y.o.   MRN: [id number]  HPI 59 year old American female, nonsmoker is in today with complaints of sore throat, cough, congestion 3 days. Cough productive with yellow phlegm. Has taken a couple days of Mucinex DM without much relief. Denies any fever, chills or headache. Symptoms off and on.   Review of Systems  Constitutional: Negative.  Negative for fever and chills.  HENT: Positive for congestion, postnasal drip, rhinorrhea, sneezing and sore throat.   Respiratory: Positive for cough. Negative for shortness of breath and wheezing.   Cardiovascular: Negative.   Gastrointestinal: Negative.   Endocrine: Negative.   Musculoskeletal: Negative.   Allergic/Immunologic: Negative.   Neurological: Negative.   Psychiatric/Behavioral: Negative.    Past Medical History  Diagnosis Date  . Kidney stone   . Hyperlipidemia   . IGT (impaired glucose tolerance)   . Hypertension   . Headache(784.0)     History   Social History  . Marital Status: Married    Spouse Name: N/A  . Number of Children: N/A  . Years of Education: N/A   Occupational History  . Not on file.   Social History Main Topics  . Smoking status: Never Smoker   . Smokeless tobacco: Never Used  . Alcohol Use: No  . Drug Use: No  . Sexual Activity: Not on file   Other Topics Concern  . Not on file   Social History Narrative    Past Surgical History  Procedure Laterality Date  . Abdominal hysterectomy    . Tonsillectomy and adenoidectomy      Family History  Problem Relation Age of Onset  . Hypertension Mother   . Hyperlipidemia Mother   . Thyroid disease Mother   . Cancer Father     prostate  . Obesity Sister   . Diabetes Other   . Hyperlipidemia Other   . Hypertension Other   . Cancer Other     prostate  . Kidney disease Other   . Obesity Sister   . Heart attack Sister     No Known Allergies  Current Outpatient  Prescriptions on File Prior to Visit  Medication Sig Dispense Refill  . famciclovir (FAMVIR) 250 MG tablet Take 250 mg by mouth 2 (two) times daily as needed.    Marland Kitchen losartan-hydrochlorothiazide (HYZAAR) 100-25 MG per tablet TAKE 1 TABLET BY MOUTH EVERY DAY 30 tablet 5  . HYDROcodone-homatropine (HYCODAN) 5-1.5 MG/5ML syrup Take 5 mLs by mouth every 6 (six) hours as needed for cough. (Patient not taking: Reported on 12/17/2014) 120 mL 0   No current facility-administered medications on file prior to visit.    BP 120/70 mmHg  Pulse 103  Temp(Src) 98.6 F (37 C) (Oral)  Wt 160 lb 8 oz (72.802 kg)chart    Objective:   Physical Exam  Constitutional: She is oriented to person, place, and time. She appears well-developed and well-nourished.  HENT:  Right Ear: External ear normal.  Left Ear: External ear normal.  Mildly erythematous pharynx. No exudate  Neck: Normal range of motion.  Cardiovascular: Normal rate, regular rhythm and normal heart sounds.   Pulmonary/Chest: Effort normal and breath sounds normal.  Musculoskeletal: Normal range of motion.  Neurological: She is alert and oriented to person, place, and time.  Skin: Skin is warm and dry.  Psychiatric: She has a normal mood and affect.          Assessment & Plan:  Rebecca Paul was seen today for sore throat, cough and nasal congestion.  Diagnoses and all orders for this visit:  Post-nasal drip  Allergic rhinitis, unspecified allergic rhinitis type  Sore throat Orders: -     POCT rapid strep A   Symptoms are most likely related to allergic rhinitis and postnasal drip. Suggest over-the-counter Zyrtec-D for Allegra-D. Offered allergy panel allergytesting.Patient declines today.

## 2014-12-17 NOTE — Patient Instructions (Signed)
Hay Fever Hay fever is an allergic reaction to particles in the air. It cannot be passed from person to person. It cannot be cured, but it can be controlled. CAUSES  Hay fever is caused by something that triggers an allergic reaction (allergens). The following are examples of allergens:  Ragweed.  Feathers.  Animal dander.  Grass and tree pollens.  Cigarette smoke.  House dust.  Pollution. SYMPTOMS   Sneezing.  Runny or stuffy nose.  Tearing eyes.  Itchy eyes, nose, mouth, throat, skin, or other area.  Sore throat.  Headache.  Decreased sense of smell or taste. DIAGNOSIS Your caregiver will perform a physical exam and ask questions about the symptoms you are having.Allergy testing may be done to determine exactly what triggers your hay fever.  TREATMENT   Over-the-counter medicines may help symptoms. These include:  Antihistamines.  Decongestants. These may help with nasal congestion.  Your caregiver may prescribe medicines if over-the-counter medicines do not work.  Some people benefit from allergy shots when other medicines are not helpful. HOME CARE INSTRUCTIONS   Avoid the allergen that is causing your symptoms, if possible.  Take all medicine as told by your caregiver. SEEK MEDICAL CARE IF:   You have severe allergy symptoms and your current medicines are not helping.  Your treatment was working at one time, but you are now experiencing symptoms.  You have sinus congestion and pressure.  You develop a fever or headache.  You have thick nasal discharge.  You have asthma and have a worsening cough and wheezing. SEEK IMMEDIATE MEDICAL CARE IF:   You have swelling of your tongue or lips.  You have trouble breathing.  You feel lightheaded or like you are going to faint.  You have cold sweats.  You have a fever. Document Released: 10/04/2005 Document Revised: 12/27/2011 Document Reviewed: 12/30/2010 [facility] Patient Information 2015  [place], [place]. This information is not intended to replace advice given to you by your health care provider. Make sure you discuss any questions you have with your health care provider.

## 2014-12-30 ENCOUNTER — Ambulatory Visit: Payer: BLUE CROSS/BLUE SHIELD | Admitting: Internal Medicine

## 2015-02-05 ENCOUNTER — Other Ambulatory Visit: Payer: Self-pay | Admitting: Internal Medicine

## 2015-02-18 LAB — LIPID PANEL
CHOLESTEROL: 241 mg/dL — AB (ref 0–200)
HDL: 57 mg/dL (ref 35–70)
LDL CALC: 158 mg/dL
LDl/HDL Ratio: 4.2
TRIGLYCERIDES: 128 mg/dL (ref 40–160)

## 2015-02-18 LAB — BASIC METABOLIC PANEL: GLUCOSE: 111 mg/dL

## 2015-02-18 LAB — HEMOGLOBIN A1C: Hgb A1c MFr Bld: 7.5 % — AB (ref 4.0–6.0)

## 2015-03-10 ENCOUNTER — Ambulatory Visit: Payer: BLUE CROSS/BLUE SHIELD | Admitting: Internal Medicine

## 2015-05-05 ENCOUNTER — Ambulatory Visit (INDEPENDENT_AMBULATORY_CARE_PROVIDER_SITE_OTHER): Payer: BLUE CROSS/BLUE SHIELD | Admitting: Internal Medicine

## 2015-05-05 ENCOUNTER — Encounter: Payer: Self-pay | Admitting: Internal Medicine

## 2015-05-05 DIAGNOSIS — I10 Essential (primary) hypertension: Secondary | ICD-10-CM

## 2015-05-05 DIAGNOSIS — E739 Lactose intolerance, unspecified: Secondary | ICD-10-CM

## 2015-05-05 LAB — GLUCOSE, POCT (MANUAL RESULT ENTRY): POC GLUCOSE: 116 mg/dL — AB (ref 70–99)

## 2015-05-05 LAB — HEMOGLOBIN A1C: Hgb A1c MFr Bld: 6 % (ref 4.6–6.5)

## 2015-05-05 NOTE — Progress Notes (Signed)
Pre visit review using our clinic review tool, if applicable. No additional management support is needed unless otherwise documented below in the visit note. 

## 2015-05-05 NOTE — Progress Notes (Signed)
Subjective:    Patient ID: Rebecca Paul, female    DOB: 05/29/1956, 59 y.o.   MRN: [id number]  HPI  Wt Readings from Last 3 Encounters:  05/05/15 158 lb (71.668 kg)  12/17/14 160 lb 8 oz (72.802 kg)  07/17/14 156 lb (70.761 kg)    Lab Results  Component Value Date   HGBA1C 6.0 04/23/2014   59 year old patient who has a history of essential hypertension, as well as impaired glucose tolerance.  She had a health screening at work performed revealed a hemoglobin A1c of 7.5. Denies any hypoglycemic symptoms  Past Medical History  Diagnosis Date  . Kidney stone   . Hyperlipidemia   . IGT (impaired glucose tolerance)   . Hypertension   . Headache(784.0)     History   Social History  . Marital Status: Married    Spouse Name: N/A  . Number of Children: N/A  . Years of Education: N/A   Occupational History  . Not on file.   Social History Main Topics  . Smoking status: Never Smoker   . Smokeless tobacco: Never Used  . Alcohol Use: No  . Drug Use: No  . Sexual Activity: Not on file   Other Topics Concern  . Not on file   Social History Narrative    Past Surgical History  Procedure Laterality Date  . Abdominal hysterectomy    . Tonsillectomy and adenoidectomy      Family History  Problem Relation Age of Onset  . Hypertension Mother   . Hyperlipidemia Mother   . Thyroid disease Mother   . Cancer Father     prostate  . Obesity Sister   . Diabetes Other   . Hyperlipidemia Other   . Hypertension Other   . Cancer Other     prostate  . Kidney disease Other   . Obesity Sister   . Heart attack Sister     No Known Allergies  Current Outpatient Prescriptions on File Prior to Visit  Medication Sig Dispense Refill  . famciclovir (FAMVIR) 250 MG tablet Take 250 mg by mouth 2 (two) times daily as needed.    Marland Kitchen losartan-hydrochlorothiazide (HYZAAR) 100-25 MG per tablet TAKE 1 TABLET BY MOUTH EVERY DAY 30 tablet 2   No current facility-administered  medications on file prior to visit.    BP 112/70 mmHg  Pulse 69  Temp(Src) 98.6 F (37 C) (Oral)  Resp 20  Ht 5' 0.5" (1.537 m)  Wt 158 lb (71.668 kg)  BMI 30.34 kg/m2  SpO2 96%      Review of Systems  Constitutional: Negative.   HENT: Negative for congestion, dental problem, hearing loss, rhinorrhea, sinus pressure, sore throat and tinnitus.   Eyes: Negative for pain, discharge and visual disturbance.  Respiratory: Negative for cough and shortness of breath.   Cardiovascular: Negative for chest pain, palpitations and leg swelling.  Gastrointestinal: Negative for nausea, vomiting, abdominal pain, diarrhea, constipation, blood in stool and abdominal distention.  Genitourinary: Negative for dysuria, urgency, frequency, hematuria, flank pain, vaginal bleeding, vaginal discharge, difficulty urinating, vaginal pain and pelvic pain.  Musculoskeletal: Negative for joint swelling, arthralgias and gait problem.  Skin: Negative for rash.  Neurological: Negative for dizziness, syncope, speech difficulty, weakness, numbness and headaches.  Hematological: Negative for adenopathy.  Psychiatric/Behavioral: Negative for behavioral problems, dysphoric mood and agitation. The patient is not nervous/anxious.        Objective:   Physical Exam  Constitutional: She is oriented to person, place, and time.  She appears well-developed and well-nourished.  HENT:  Head: Normocephalic.  Right Ear: External ear normal.  Left Ear: External ear normal.  Mouth/Throat: Oropharynx is clear and moist.  Eyes: Conjunctivae and EOM are normal. Pupils are equal, round, and reactive to light.  Neck: Normal range of motion. Neck supple. No thyromegaly present.  Cardiovascular: Normal rate, regular rhythm, normal heart sounds and intact distal pulses.   Pulmonary/Chest: Effort normal and breath sounds normal.  Abdominal: Soft. Bowel sounds are normal. She exhibits no mass. There is no tenderness.    Musculoskeletal: Normal range of motion.  Lymphadenopathy:    She has no cervical adenopathy.  Neurological: She is alert and oriented to person, place, and time.  Skin: Skin is warm and dry. No rash noted.  Psychiatric: She has a normal mood and affect. Her behavior is normal.          Assessment & Plan:   Impaired glucose tolerance.  We'll recheck the hemoglobin A1c.  If greater than 6.5 .  Will initiate metformin therapy Exercise, weight loss encouraged Hypertension, stable

## 2015-05-05 NOTE — Patient Instructions (Addendum)
Limit your sodium (Salt) intake    It is important that you exercise regularly, at least 20 minutes 3 to 4 times per week.  If you develop chest pain or shortness of breath seek  medical attention.  You need to lose weight.  Consider a lower calorie diet and regular exercise.  Diabetes, Eating Away From Home Sometimes, you might eat in a restaurant or have meals that are prepared by someone else. You can enjoy eating out. However, the portions in restaurants may be much larger than needed. Listed below are some ideas to help you choose foods that will keep your blood glucose (sugar) in better control.  TIPS FOR EATING OUT  Know your meal plan and how many carbohydrate servings you should have at each meal. You may wish to carry a copy of your meal plan in your purse or wallet. Learn the foods included in each food group.  Make a list of restaurants near you that offer healthy choices. Take a copy of the carry-out menus to see what they offer. Then, you can plan what you will order ahead of time.  Become familiar with serving sizes by practicing them at home using measuring cups and spoons. Once you learn to recognize portion sizes, you will be able to correctly estimate the amount of total carbohydrate you are allowed to eat at the restaurant. Ask for a takeout box if the portion is more than you should have. When your food comes, leave the amount you should have on the plate, and put the rest in the takeout box before you start eating.  Plan ahead if your mealtime will be different from usual. Check with your caregiver to find out how to time meals and medicine if you are taking insulin.  Avoid high-fat foods, such as fried foods, cream sauces, high-fat salad dressings, or any added butter or margarine.  Do not be afraid to ask questions. Ask your server about the portion size, cooking methods, ingredients and if items can be substituted. Restaurants do not list all available items on the menu.  You can ask for your main entree to be prepared using skim milk, oil instead of butter or margarine, and without gravy or sauces. Ask your waiter or waitress to serve salad dressings, gravy, sauces, margarine, and sour cream on the side. You can then add the amount your meal plan suggests.  Add more vegetables whenever possible.  Avoid items that are labeled "jumbo," "giant," "deluxe," or "supersized."  You may want to split an entre with someone and order an extra side salad.  Watch for hidden calories in foods like croutons, bacon, or cheese.  Ask your server to take away the bread basket or chips from your table.  Order a dinner salad as an appetizer. You can eat most foods served in a restaurant. Some foods are better choices than others. Breads and Starches  Recommended: All kinds of bread (wheat, rye, white, oatmeal, [place], [place], raisin), hard or soft dinner rolls, frankfurter or hamburger buns, small bagels, small corn or whole-wheat flour tortillas.  Avoid: Frosted or glazed breads, butter rolls, egg or cheese breads, croissants, sweet rolls, pastries, coffee cake, glazed or frosted doughnuts, muffins. Crackers  Recommended: Animal crackers, graham, rye, saltine, oyster, and matzoth crackers. Bread sticks, melba toast, rusks, pretzels, popcorn (without fat), zwieback toast.  Avoid: High-fat snack crackers or chips. Buttered popcorn. Cereals  Recommended: Hot and cold cereals. Whole grains such as oatmeal or shredded wheat are good choices.  Avoid: Sugar-coated or granola type cereals. Potatoes/Pasta/Rice/Beans  Recommended: Order baked, boiled, or mashed potatoes, rice or noodles without added fat, whole beans. Order gravies, butter, margarine, or sauces on the side so you can control the amount you add.  Avoid: Hash browns or fried potatoes. Potatoes, pasta, or rice prepared with cream or cheese sauce. Potato or pasta salads prepared with large amounts of dressing.  Fried beans or fried rice. Vegetables  Recommended: Order steamed, baked, boiled, or stewed vegetables without sauces or extra fat. Ask that sauce be served on the side. If vegetables are not listed on the menu, ask what is available.  Avoid: Vegetables prepared with cream, butter, or cheese sauce. Fried vegetables. Salad Bars  Recommended: Many of the vegetables at a salad bar are considered "free." Use lemon juice, vinegar, or low-calorie salad dressing (fewer than 20 calories per serving) as "free" dressings for your salad. Look for salad bar ingredients that have no added fat or sugar such as tomatoes, lettuce, cucumbers, broccoli, carrots, onions, and mushrooms.  Avoid: Prepared salads with large amounts of dressing, such as coleslaw, caesar salad, macaroni salad, bean salad, or carrot salad. Fruit  Recommended: Eat fresh fruit or fresh fruit salad without added dressing. A salad bar often offers fresh fruit choices, but canned fruit at a restaurant is usually packed in sugar or syrup.  Avoid: Sweetened canned or frozen fruits, plain or sweetened fruit juice. Fruit salads with dressing, sour cream, or sugar added to them. Meat and Meat Substitutes  Recommended: Order broiled, baked, roasted, or grilled meat, poultry, or fish. Trim off all visible fat. Do not eat the skin of poultry. The size stated on the menu is the raw weight. Meat shrinks by  in cooking (for example, 4 oz raw equals 3 oz cooked meat).  Avoid: Deep-fat fried meat, poultry, or fish. Breaded meats. Eggs  Recommended: Order soft, hard-cooked, poached, or scrambled eggs. Omelets may be okay, depending on what ingredients are added. Egg substitutes are also a good choice.  Avoid: Fried eggs, eggs prepared with cream or cheese sauce. Milk  Recommended: Order low-fat or fat-free milk according to your meal plan. Plain, nonfat yogurt or flavored yogurt with no sugar added may be used as a substitute for milk. Soy milk may  also be used.  Avoid: Milk shakes or sweetened milk beverages. Soups and Combination Foods  Recommended: Clear broth or consomm are "free" foods and may be used as an appetizer. Broth-based soups with fat removed count as a starch serving and are preferred over cream soups. Soups made with beans or split peas may be eaten but count as a starch.  Avoid: Fatty soups, soup made with cream, cheese soup. Combination foods prepared with excessive amounts of fat or with cream or cheese sauces. Desserts and Sweets  Recommended: Ask for fresh fruit. Sponge or angel food cake without icing, ice milk, no sugar added ice cream, sherbet, or frozen yogurt may fit into your meal plan occasionally.  Avoid: Pastries, puddings, pies, cakes with icing, custard, gelatin desserts. Fats and Oils  Recommended: Choose healthy fats such as olive oil, canola oil, or tub margarine, reduced fat or fat-free sour cream, cream cheese, avocado, or nuts.  Avoid: Any fats in excess of your allowed portion. Deep-fried foods or any food with a large amount of fat. Note: Ask for all fats to be served on the side, and limit your portion sizes according to your meal plan. Document Released: 10/04/2005 Document Revised: 12/27/2011 Document  Reviewed: 01/01/2014 [employer] Patient Information 2015 [place], [place]. This information is not intended to replace advice given to you by your health care provider. Make sure you discuss any questions you have with your health care provider. Type 2 Diabetes Mellitus Type 2 diabetes mellitus, often simply referred to as type 2 diabetes, is a long-lasting (chronic) disease. In type 2 diabetes, the pancreas does not make enough insulin (a hormone), the cells are less responsive to the insulin that is made (insulin resistance), or both. Normally, insulin moves sugars from food into the tissue cells. The tissue cells use the sugars for energy. The lack of insulin or the lack of normal response to  insulin causes excess sugars to build up in the blood instead of going into the tissue cells. As a result, high blood sugar (hyperglycemia) develops. The effect of high sugar (glucose) levels can cause many complications. Type 2 diabetes was also previously called adult-onset diabetes, but it can occur at any age.  RISK FACTORS  A person is predisposed to developing type 2 diabetes if someone in the family has the disease and also has one or more of the following primary risk factors:  Overweight.  An inactive lifestyle.  A history of consistently eating high-calorie foods. Maintaining a normal weight and regular physical activity can reduce the chance of developing type 2 diabetes. SYMPTOMS  A person with type 2 diabetes may not show symptoms initially. The symptoms of type 2 diabetes appear slowly. The symptoms include:  Increased thirst (polydipsia).  Increased urination (polyuria).  Increased urination during the night (nocturia).  Weight loss. This weight loss may be rapid.  Frequent, recurring infections.  Tiredness (fatigue).  Weakness.  Vision changes, such as blurred vision.  Fruity smell to your breath.  Abdominal pain.  Nausea or vomiting.  Cuts or bruises which are slow to heal.  Tingling or numbness in the hands or feet. DIAGNOSIS Type 2 diabetes is frequently not diagnosed until complications of diabetes are present. Type 2 diabetes is diagnosed when symptoms or complications are present and when blood glucose levels are increased. Your blood glucose level may be checked by one or more of the following blood tests:  A fasting blood glucose test. You will not be allowed to eat for at least 8 hours before a blood sample is taken.  A random blood glucose test. Your blood glucose is checked at any time of the day regardless of when you ate.  A hemoglobin A1c blood glucose test. A hemoglobin A1c test provides information about blood glucose control over the  previous 3 months.  An oral glucose tolerance test (OGTT). Your blood glucose is measured after you have not eaten (fasted) for 2 hours and then after you drink a glucose-containing beverage. TREATMENT   You may need to take insulin or diabetes medicine daily to keep blood glucose levels in the desired range.  If you use insulin, you may need to adjust the dosage depending on the carbohydrates that you eat with each meal or snack. The treatment goal is to maintain the before meal blood sugar (preprandial glucose) level at 70-130 mg/dL. HOME CARE INSTRUCTIONS   Have your hemoglobin A1c level checked twice a year.  Perform daily blood glucose monitoring as directed by your health care provider.  Monitor urine ketones when you are ill and as directed by your health care provider.  Take your diabetes medicine or insulin as directed by your health care provider to maintain your blood glucose levels in  the desired range.  Never run out of diabetes medicine or insulin. It is needed every day.  If you are using insulin, you may need to adjust the amount of insulin given based on your intake of carbohydrates. Carbohydrates can raise blood glucose levels but need to be included in your diet. Carbohydrates provide vitamins, minerals, and fiber which are an essential part of a healthy diet. Carbohydrates are found in fruits, vegetables, whole grains, dairy products, legumes, and foods containing added sugars.  Eat healthy foods. You should make an appointment to see a registered dietitian to help you create an eating plan that is right for you.  Lose weight if you are overweight.  Carry a medical alert card or wear your medical alert jewelry.  Carry a 15-gram carbohydrate snack with you at all times to treat low blood glucose (hypoglycemia). Some examples of 15-gram carbohydrate snacks include:  Glucose tablets, 3 or 4.  Glucose gel, 15-gram tube.  Raisins, 2 tablespoons (24 grams).  Jelly  beans, 6.  Animal crackers, 8.  Regular pop, 4 ounces (120 mL).  Gummy treats, 9.  Recognize hypoglycemia. Hypoglycemia occurs with blood glucose levels of 70 mg/dL and below. The risk for hypoglycemia increases when fasting or skipping meals, during or after intense exercise, and during sleep. Hypoglycemia symptoms can include:  Tremors or shakes.  Decreased ability to concentrate.  Sweating.  Increased heart rate.  Headache.  Dry mouth.  Hunger.  Irritability.  Anxiety.  Restless sleep.  Altered speech or coordination.  Confusion.  Treat hypoglycemia promptly. If you are alert and able to safely swallow, follow the 15:15 rule:  Take 15-20 grams of rapid-acting glucose or carbohydrate. Rapid-acting options include glucose gel, glucose tablets, or 4 ounces (120 mL) of fruit juice, regular soda, or low-fat milk.  Check your blood glucose level 15 minutes after taking the glucose.  Take 15-20 grams more of glucose if the repeat blood glucose level is still 70 mg/dL or below.  Eat a meal or snack within 1 hour once blood glucose levels return to normal.  Be alert to feeling very thirsty and urinating more frequently than usual, which are early signs of hyperglycemia. An early awareness of hyperglycemia allows for prompt treatment. Treat hyperglycemia as directed by your health care provider.  Engage in at least 150 minutes of moderate-intensity physical activity a week, spread over at least 3 days of the week or as directed by your health care provider. In addition, you should engage in resistance exercise at least 2 times a week or as directed by your health care provider. Try to spend no more than 90 minutes at one time inactive.  Adjust your medicine and food intake as needed if you start a new exercise or sport.  Follow your sick-day plan anytime you are unable to eat or drink as usual.  Do not use any tobacco products including cigarettes, chewing tobacco, or  electronic cigarettes. If you need help quitting, ask your health care provider.  Limit alcohol intake to no more than 1 drink per day for nonpregnant women and 2 drinks per day for men. You should drink alcohol only when you are also eating food. Talk with your health care provider whether alcohol is safe for you. Tell your health care provider if you drink alcohol several times a week.  Keep all follow-up visits as directed by your health care provider. This is important.  Schedule an eye exam soon after the diagnosis of type 2 diabetes and  then annually.  Perform daily skin and foot care. Examine your skin and feet daily for cuts, bruises, redness, nail problems, bleeding, blisters, or sores. A foot exam by a health care provider should be done annually.  Brush your teeth and gums at least twice a day and floss at least once a day. Follow up with your dentist regularly.  Share your diabetes management plan with your workplace or school.  Stay up-to-date with immunizations. It is recommended that people with diabetes who are over 40 years old get the pneumonia vaccine. In some cases, two separate shots may be given. Ask your health care provider if your pneumonia vaccination is up-to-date.  Learn to manage stress.  Obtain ongoing diabetes education and support as needed.  Participate in or seek rehabilitation as needed to maintain or improve independence and quality of life. Request a physical or occupational therapy referral if you are having foot or hand numbness, or difficulties with grooming, dressing, eating, or physical activity. SEEK MEDICAL CARE IF:   You are unable to eat food or drink fluids for more than 6 hours.  You have nausea and vomiting for more than 6 hours.  Your blood glucose level is over 240 mg/dL.  There is a change in mental status.  You develop an additional serious illness.  You have diarrhea for more than 6 hours.  You have been sick or have had a fever  for a couple of days and are not getting better.  You have pain during any physical activity.  SEEK IMMEDIATE MEDICAL CARE IF:  You have difficulty breathing.  You have moderate to large ketone levels. MAKE SURE YOU:  Understand these instructions.  Will watch your condition.  Will get help right away if you are not doing well or get worse. Document Released: 10/04/2005 Document Revised: 02/18/2014 Document Reviewed: 05/02/2012 [facility] Patient Information 2015 [place], [place]. This information is not intended to replace advice given to you by your health care provider. Make sure you discuss any questions you have with your health care provider. Diabetes and Exercise Exercising regularly is important. It is not just about losing weight. It has many health benefits, such as:  Improving your overall fitness, flexibility, and endurance.  Increasing your bone density.  Helping with weight control.  Decreasing your body fat.  Increasing your muscle strength.  Reducing stress and tension.  Improving your overall health. People with diabetes who exercise gain additional benefits because exercise:  Reduces appetite.  Improves the body's use of blood sugar (glucose).  Helps lower or control blood glucose.  Decreases blood pressure.  Helps control blood lipids (such as cholesterol and triglycerides).  Improves the body's use of the hormone insulin by:  Increasing the body's insulin sensitivity.  Reducing the body's insulin needs.  Decreases the risk for heart disease because exercising:  Lowers cholesterol and triglycerides levels.  Increases the levels of good cholesterol (such as high-density lipoproteins [HDL]) in the body.  Lowers blood glucose levels. YOUR ACTIVITY PLAN  Choose an activity that you enjoy and set realistic goals. Your health care provider or diabetes educator can help you make an activity plan that works for you. Exercise regularly as directed by  your health care provider. This includes:  Performing resistance training twice a week such as push-ups, sit-ups, lifting weights, or using resistance bands.  Performing 150 minutes of cardio exercises each week such as walking, running, or playing sports.  Staying active and spending no more than 90 minutes at one  time being inactive. Even short bursts of exercise are good for you. Three 10-minute sessions spread throughout the day are just as beneficial as a single 30-minute session. Some exercise ideas include:  Taking the dog for a walk.  Taking the stairs instead of the elevator.  Dancing to your favorite song.  Doing an exercise video.  Doing your favorite exercise with a friend. RECOMMENDATIONS FOR EXERCISING WITH TYPE 1 OR TYPE 2 DIABETES   Check your blood glucose before exercising. If blood glucose levels are greater than 240 mg/dL, check for urine ketones. Do not exercise if ketones are present.  Avoid injecting insulin into areas of the body that are going to be exercised. For example, avoid injecting insulin into:  The arms when playing tennis.  The legs when jogging.  Keep a record of:  Food intake before and after you exercise.  Expected peak times of insulin action.  Blood glucose levels before and after you exercise.  The type and amount of exercise you have done.  Review your records with your health care provider. Your health care provider will help you to develop guidelines for adjusting food intake and insulin amounts before and after exercising.  If you take insulin or oral hypoglycemic agents, watch for signs and symptoms of hypoglycemia. They include:  Dizziness.  Shaking.  Sweating.  Chills.  Confusion.  Drink plenty of water while you exercise to prevent dehydration or heat stroke. Body water is lost during exercise and must be replaced.  Talk to your health care provider before starting an exercise program to make sure it is safe for  you. Remember, almost any type of activity is better than none. Document Released: 12/25/2003 Document Revised: 02/18/2014 Document Reviewed: 03/13/2013 [facility] Patient Information 2015 [place], [place]. This information is not intended to replace advice given to you by your health care provider. Make sure you discuss any questions you have with your health care provider. , I can get her medicine orders on this on her chart

## 2015-05-05 NOTE — Addendum Note (Signed)
Addended by: Marian Sorrow on: 05/05/2015 04:34 PM   Modules accepted: Orders

## 2015-05-06 ENCOUNTER — Encounter: Payer: Self-pay | Admitting: Internal Medicine

## 2015-05-12 ENCOUNTER — Other Ambulatory Visit (INDEPENDENT_AMBULATORY_CARE_PROVIDER_SITE_OTHER): Payer: BLUE CROSS/BLUE SHIELD

## 2015-05-12 DIAGNOSIS — Z0000 Encounter for general adult medical examination without abnormal findings: Secondary | ICD-10-CM

## 2015-05-12 LAB — HEPATIC FUNCTION PANEL
ALK PHOS: 45 U/L (ref 39–117)
ALT: 10 U/L (ref 0–35)
AST: 14 U/L (ref 0–37)
Albumin: 4.3 g/dL (ref 3.5–5.2)
Bilirubin, Direct: 0.1 mg/dL (ref 0.0–0.3)
TOTAL PROTEIN: 7.9 g/dL (ref 6.0–8.3)
Total Bilirubin: 0.5 mg/dL (ref 0.2–1.2)

## 2015-05-12 LAB — CBC WITH DIFFERENTIAL/PLATELET
BASOS PCT: 0.6 % (ref 0.0–3.0)
Basophils Absolute: 0 10*3/uL (ref 0.0–0.1)
EOS PCT: 1.3 % (ref 0.0–5.0)
Eosinophils Absolute: 0.1 10*3/uL (ref 0.0–0.7)
HCT: 40.2 % (ref 36.0–46.0)
Hemoglobin: 13.7 g/dL (ref 12.0–15.0)
LYMPHS ABS: 2.5 10*3/uL (ref 0.7–4.0)
Lymphocytes Relative: 36.3 % (ref 12.0–46.0)
MCHC: 34.1 g/dL (ref 30.0–36.0)
MCV: 81 fl (ref 78.0–100.0)
Monocytes Absolute: 0.6 10*3/uL (ref 0.1–1.0)
Monocytes Relative: 8.3 % (ref 3.0–12.0)
NEUTROS PCT: 53.5 % (ref 43.0–77.0)
Neutro Abs: 3.7 10*3/uL (ref 1.4–7.7)
Platelets: 267 10*3/uL (ref 150.0–400.0)
RBC: 4.96 Mil/uL (ref 3.87–5.11)
RDW: 13.7 % (ref 11.5–15.5)
WBC: 6.8 10*3/uL (ref 4.0–10.5)

## 2015-05-12 LAB — BASIC METABOLIC PANEL
BUN: 20 mg/dL (ref 6–23)
CO2: 25 meq/L (ref 19–32)
CREATININE: 0.83 mg/dL (ref 0.40–1.20)
Calcium: 10.4 mg/dL (ref 8.4–10.5)
Chloride: 103 mEq/L (ref 96–112)
GFR: 90.79 mL/min (ref >60.00)
Glucose, Bld: 95 mg/dL (ref 70–99)
POTASSIUM: 3.8 meq/L (ref 3.5–5.1)
SODIUM: 137 meq/L (ref 135–145)

## 2015-05-12 LAB — POCT URINALYSIS DIPSTICK
Bilirubin, UA: NEGATIVE
Glucose, UA: NEGATIVE
Ketones, UA: NEGATIVE
LEUKOCYTES UA: NEGATIVE
NITRITE UA: NEGATIVE
PROTEIN UA: NEGATIVE
Spec Grav, UA: 1.02
UROBILINOGEN UA: 0.2
pH, UA: 5.5

## 2015-05-12 LAB — LIPID PANEL
CHOL/HDL RATIO: 5
Cholesterol: 236 mg/dL — ABNORMAL HIGH (ref 0–200)
HDL: 43.8 mg/dL (ref >39.00)
LDL Cholesterol: 177 mg/dL — ABNORMAL HIGH (ref 0–99)
NONHDL: 192.2
TRIGLYCERIDES: 77 mg/dL (ref 0.0–149.0)
VLDL: 15.4 mg/dL (ref 0.0–40.0)

## 2015-05-12 LAB — TSH: TSH: 1.27 u[IU]/mL (ref 0.35–4.50)

## 2015-05-19 ENCOUNTER — Telehealth: Payer: Self-pay | Admitting: Internal Medicine

## 2015-05-19 ENCOUNTER — Encounter: Payer: BLUE CROSS/BLUE SHIELD | Admitting: Internal Medicine

## 2015-05-19 NOTE — Telephone Encounter (Signed)
Spoke to pt, told her Hemoglobin A1c was 6.0. Pt verbalized understanding.

## 2015-05-19 NOTE — Telephone Encounter (Signed)
Pt states she did not get a cb concerning her a1c on 7/185.  Pt would like these results.   Pt has emergency at work this am and had to resc cpe until next Monday.  Pt wasn't sure if you were waiting on her cpe  appt to giver her those results.

## 2015-05-24 ENCOUNTER — Other Ambulatory Visit: Payer: Self-pay | Admitting: Internal Medicine

## 2015-05-26 ENCOUNTER — Encounter: Payer: Self-pay | Admitting: Internal Medicine

## 2015-05-26 ENCOUNTER — Ambulatory Visit (INDEPENDENT_AMBULATORY_CARE_PROVIDER_SITE_OTHER): Payer: BLUE CROSS/BLUE SHIELD | Admitting: Internal Medicine

## 2015-05-26 VITALS — BP 120/78 | HR 64 | Resp 20 | Ht 60.5 in | Wt 159.0 lb

## 2015-05-26 DIAGNOSIS — Z0000 Encounter for general adult medical examination without abnormal findings: Secondary | ICD-10-CM | POA: Diagnosis not present

## 2015-05-26 DIAGNOSIS — I10 Essential (primary) hypertension: Secondary | ICD-10-CM

## 2015-05-26 DIAGNOSIS — E739 Lactose intolerance, unspecified: Secondary | ICD-10-CM

## 2015-05-26 DIAGNOSIS — E785 Hyperlipidemia, unspecified: Secondary | ICD-10-CM

## 2015-05-26 NOTE — Progress Notes (Signed)
Pre visit review using our clinic review tool, if applicable. No additional management support is needed unless otherwise documented below in the visit note. 

## 2015-05-26 NOTE — Progress Notes (Signed)
Subjective:    Patient ID: Rebecca Paul, female    DOB: July 17, 1956, 59 y.o.   MRN: [id number]  HPI  60 year old patient who is seen today for a wellness exam.  She has a history of hypertension and mild dyslipidemia.  She has occasional low back pain, but in general doing quite well.  She does have annual mammograms.  Colonoscopy was in 2009.  No concerns or complaints today.   Past Medical History  Diagnosis Date  . Kidney stone   . Hyperlipidemia   . IGT (impaired glucose tolerance)   . Hypertension   . Headache(784.0)     History   Social History  . Marital Status: Married    Spouse Name: N/A  . Number of Children: N/A  . Years of Education: N/A   Occupational History  . Not on file.   Social History Main Topics  . Smoking status: Never Smoker   . Smokeless tobacco: Never Used  . Alcohol Use: No  . Drug Use: No  . Sexual Activity: Not on file   Other Topics Concern  . Not on file   Social History Narrative    Past Surgical History  Procedure Laterality Date  . Abdominal hysterectomy    . Tonsillectomy and adenoidectomy      Family History  Problem Relation Age of Onset  . Hypertension Mother   . Hyperlipidemia Mother   . Thyroid disease Mother   . Cancer Father     prostate  . Obesity Sister   . Diabetes Other   . Hyperlipidemia Other   . Hypertension Other   . Cancer Other     prostate  . Kidney disease Other   . Obesity Sister   . Heart attack Sister     No Known Allergies  Current Outpatient Prescriptions on File Prior to Visit  Medication Sig Dispense Refill  . famciclovir (FAMVIR) 250 MG tablet Take 250 mg by mouth 2 (two) times daily as needed.    Marland Kitchen losartan-hydrochlorothiazide (HYZAAR) 100-25 MG per tablet TAKE 1 TABLET BY MOUTH EVERY DAY 30 tablet 2   No current facility-administered medications on file prior to visit.    BP 120/78 mmHg  Pulse 64  Resp 20  Ht 5' 0.5" (1.537 m)  Wt 159 lb (72.122 kg)  BMI 30.53 kg/m2   SpO2 98%    Review of Systems  Constitutional: Negative.   HENT: Negative for congestion, dental problem, hearing loss, rhinorrhea, sinus pressure, sore throat and tinnitus.   Eyes: Negative for pain, discharge and visual disturbance.  Respiratory: Negative for cough and shortness of breath.   Cardiovascular: Negative for chest pain, palpitations and leg swelling.  Gastrointestinal: Negative for nausea, vomiting, abdominal pain, diarrhea, constipation, blood in stool and abdominal distention.  Genitourinary: Negative for dysuria, urgency, frequency, hematuria, flank pain, vaginal bleeding, vaginal discharge, difficulty urinating, vaginal pain and pelvic pain.  Musculoskeletal: Positive for back pain. Negative for joint swelling, arthralgias and gait problem.  Skin: Negative for rash.  Neurological: Negative for dizziness, syncope, speech difficulty, weakness, numbness and headaches.  Hematological: Negative for adenopathy.  Psychiatric/Behavioral: Negative for behavioral problems, dysphoric mood and agitation. The patient is not nervous/anxious.        Objective:   Physical Exam  Constitutional: She is oriented to person, place, and time. She appears well-developed and well-nourished.  HENT:  Head: Normocephalic and atraumatic.  Right Ear: External ear normal.  Left Ear: External ear normal.  Mouth/Throat: Oropharynx is clear and  moist.  Eyes: Conjunctivae and EOM are normal.  Neck: Normal range of motion. Neck supple. No JVD present. No thyromegaly present.  Cardiovascular: Normal rate, regular rhythm, normal heart sounds and intact distal pulses.   No murmur heard. Pulmonary/Chest: Effort normal and breath sounds normal. She has no wheezes. She has no rales.  Abdominal: Soft. Bowel sounds are normal. She exhibits no distension and no mass. There is no tenderness. There is no rebound and no guarding.  Musculoskeletal: Normal range of motion. She exhibits no edema or tenderness.    Neurological: She is alert and oriented to person, place, and time. She has normal reflexes. No cranial nerve deficit. She exhibits normal muscle tone. Coordination normal.  Skin: Skin is warm and dry. No rash noted.  Psychiatric: She has a normal mood and affect. Her behavior is normal.          Assessment & Plan:   Preventive health exam Hypertension, stable History of mild dyslipidemia Intermittent low back pain  No change in medical regimen Continue home blood pressure monitoring Mammogram this fall as scheduled Follow-up colonoscopy 2019 Recheck here 6-12 months

## 2015-05-26 NOTE — Patient Instructions (Signed)
Limit your sodium (Salt) intake    It is important that you exercise regularly, at least 20 minutes 3 to 4 times per week.  If you develop chest pain or shortness of breath seek  medical attention.  Please check your blood pressure on a regular basis.  If it is consistently greater than 150/90, please make an office appointment.  Return in one year for follow-up  

## 2015-07-15 ENCOUNTER — Other Ambulatory Visit: Payer: Self-pay | Admitting: *Deleted

## 2015-07-15 MED ORDER — LOSARTAN POTASSIUM-HCTZ 100-25 MG PO TABS: 1.0000 | ORAL_TABLET | Freq: Every day | ORAL | Status: DC

## 2015-08-11 ENCOUNTER — Ambulatory Visit (INDEPENDENT_AMBULATORY_CARE_PROVIDER_SITE_OTHER): Payer: BLUE CROSS/BLUE SHIELD | Admitting: *Deleted

## 2015-08-11 DIAGNOSIS — Z23 Encounter for immunization: Secondary | ICD-10-CM | POA: Diagnosis not present

## 2015-09-29 ENCOUNTER — Other Ambulatory Visit: Payer: Self-pay | Admitting: Nurse Practitioner

## 2015-09-29 DIAGNOSIS — N644 Mastodynia: Secondary | ICD-10-CM

## 2015-09-29 LAB — HM PAP SMEAR: HM Pap smear: NEGATIVE

## 2015-10-06 ENCOUNTER — Ambulatory Visit
Admission: RE | Admit: 2015-10-06 | Discharge: 2015-10-06 | Disposition: A | Discharge status: Home / Self Care | Payer: BLUE CROSS/BLUE SHIELD | Source: Ambulatory Visit | Attending: Nurse Practitioner | Admitting: Nurse Practitioner

## 2015-10-06 DIAGNOSIS — N644 Mastodynia: Secondary | ICD-10-CM

## 2015-10-29 ENCOUNTER — Encounter: Payer: Self-pay | Admitting: Internal Medicine

## 2016-01-19 DIAGNOSIS — R635 Abnormal weight gain: Secondary | ICD-10-CM | POA: Diagnosis not present

## 2016-01-19 DIAGNOSIS — N951 Menopausal and female climacteric states: Secondary | ICD-10-CM | POA: Diagnosis not present

## 2016-02-02 DIAGNOSIS — E538 Deficiency of other specified B group vitamins: Secondary | ICD-10-CM | POA: Diagnosis not present

## 2016-02-02 DIAGNOSIS — E559 Vitamin D deficiency, unspecified: Secondary | ICD-10-CM | POA: Diagnosis not present

## 2016-02-02 DIAGNOSIS — E7800 Pure hypercholesterolemia, unspecified: Secondary | ICD-10-CM | POA: Diagnosis not present

## 2016-02-02 DIAGNOSIS — R7301 Impaired fasting glucose: Secondary | ICD-10-CM | POA: Diagnosis not present

## 2016-02-09 DIAGNOSIS — R7301 Impaired fasting glucose: Secondary | ICD-10-CM | POA: Diagnosis not present

## 2016-02-09 DIAGNOSIS — E538 Deficiency of other specified B group vitamins: Secondary | ICD-10-CM | POA: Diagnosis not present

## 2016-02-09 DIAGNOSIS — E7800 Pure hypercholesterolemia, unspecified: Secondary | ICD-10-CM | POA: Diagnosis not present

## 2016-02-09 DIAGNOSIS — E663 Overweight: Secondary | ICD-10-CM | POA: Diagnosis not present

## 2016-02-16 DIAGNOSIS — E7800 Pure hypercholesterolemia, unspecified: Secondary | ICD-10-CM | POA: Diagnosis not present

## 2016-02-16 DIAGNOSIS — R7301 Impaired fasting glucose: Secondary | ICD-10-CM | POA: Diagnosis not present

## 2016-02-16 DIAGNOSIS — E538 Deficiency of other specified B group vitamins: Secondary | ICD-10-CM | POA: Diagnosis not present

## 2016-02-16 DIAGNOSIS — E559 Vitamin D deficiency, unspecified: Secondary | ICD-10-CM | POA: Diagnosis not present

## 2016-03-01 DIAGNOSIS — E538 Deficiency of other specified B group vitamins: Secondary | ICD-10-CM | POA: Diagnosis not present

## 2016-03-01 DIAGNOSIS — E559 Vitamin D deficiency, unspecified: Secondary | ICD-10-CM | POA: Diagnosis not present

## 2016-03-01 DIAGNOSIS — E7800 Pure hypercholesterolemia, unspecified: Secondary | ICD-10-CM | POA: Diagnosis not present

## 2016-03-01 DIAGNOSIS — R7301 Impaired fasting glucose: Secondary | ICD-10-CM | POA: Diagnosis not present

## 2016-03-08 DIAGNOSIS — E538 Deficiency of other specified B group vitamins: Secondary | ICD-10-CM | POA: Diagnosis not present

## 2016-03-08 DIAGNOSIS — E7800 Pure hypercholesterolemia, unspecified: Secondary | ICD-10-CM | POA: Diagnosis not present

## 2016-03-08 DIAGNOSIS — R7301 Impaired fasting glucose: Secondary | ICD-10-CM | POA: Diagnosis not present

## 2016-03-08 DIAGNOSIS — E663 Overweight: Secondary | ICD-10-CM | POA: Diagnosis not present

## 2016-03-29 DIAGNOSIS — E538 Deficiency of other specified B group vitamins: Secondary | ICD-10-CM | POA: Diagnosis not present

## 2016-03-29 DIAGNOSIS — E663 Overweight: Secondary | ICD-10-CM | POA: Diagnosis not present

## 2016-03-29 DIAGNOSIS — R7301 Impaired fasting glucose: Secondary | ICD-10-CM | POA: Diagnosis not present

## 2016-03-29 DIAGNOSIS — E7800 Pure hypercholesterolemia, unspecified: Secondary | ICD-10-CM | POA: Diagnosis not present

## 2016-04-05 ENCOUNTER — Encounter: Payer: Self-pay | Admitting: Internal Medicine

## 2016-04-05 ENCOUNTER — Ambulatory Visit (INDEPENDENT_AMBULATORY_CARE_PROVIDER_SITE_OTHER): Payer: BLUE CROSS/BLUE SHIELD | Admitting: Internal Medicine

## 2016-04-05 VITALS — BP 118/70 | HR 55 | Temp 98.6°F | Resp 20 | Ht 60.5 in | Wt 152.0 lb

## 2016-04-05 DIAGNOSIS — I10 Essential (primary) hypertension: Secondary | ICD-10-CM

## 2016-04-05 MED ORDER — LOSARTAN POTASSIUM 100 MG PO TABS
100.0000 mg | ORAL_TABLET | Freq: Every day | ORAL | Status: DC
Start: 2016-04-05 — End: 2016-05-31

## 2016-04-05 NOTE — Progress Notes (Signed)
Pre visit review using our clinic review tool, if applicable. No additional management support is needed unless otherwise documented below in the visit note. 

## 2016-04-05 NOTE — Progress Notes (Signed)
Subjective:    Patient ID: Rebecca Paul, female    DOB: [birth year], 60 y.o.   MRN: [id number]  HPI  60 year old patient who has a history of essential hypertension.  She has been controlled on combination therapy.  She has been followed at a weight loss clinic and has had some success.  Blood pressure has been running low normal and she also has noticed some dizziness.  She generally feels well.  Past Medical History  Diagnosis Date  . Kidney stone   . Hyperlipidemia   . IGT (impaired glucose tolerance)   . Hypertension   . Headache(784.0)      Social History   Social History  . Marital Status: Married    Spouse Name: N/A  . Number of Children: N/A  . Years of Education: N/A   Occupational History  . Not on file.   Social History Main Topics  . Smoking status: Never Smoker   . Smokeless tobacco: Never Used  . Alcohol Use: No  . Drug Use: No  . Sexual Activity: Not on file   Other Topics Concern  . Not on file   Social History Narrative    Past Surgical History  Procedure Laterality Date  . Abdominal hysterectomy    . Tonsillectomy and adenoidectomy      Family History  Problem Relation Age of Onset  . Hypertension Mother   . Hyperlipidemia Mother   . Thyroid disease Mother   . Cancer Father     prostate  . Obesity Sister   . Diabetes Other   . Hyperlipidemia Other   . Hypertension Other   . Cancer Other     prostate  . Kidney disease Other   . Obesity Sister   . Heart attack Sister     No Known Allergies  Current Outpatient Prescriptions on File Prior to Visit  Medication Sig Dispense Refill  . famciclovir (FAMVIR) 250 MG tablet Take 250 mg by mouth 2 (two) times daily as needed.     No current facility-administered medications on file prior to visit.    BP 118/70 mmHg  Pulse 55  Temp(Src) 98.6 F (37 C) (Oral)  Resp 20  Ht 5' 0.5" (1.537 m)  Wt 152 lb (68.947 kg)  BMI 29.19 kg/m2  SpO2 98%     Review of Systems  HENT:  Negative for congestion, dental problem, hearing loss, rhinorrhea, sinus pressure, sore throat and tinnitus.   Eyes: Negative for pain, discharge and visual disturbance.  Respiratory: Negative for cough and shortness of breath.   Cardiovascular: Negative for chest pain, palpitations and leg swelling.  Gastrointestinal: Negative for nausea, vomiting, abdominal pain, diarrhea, constipation, blood in stool and abdominal distention.  Genitourinary: Negative for dysuria, urgency, frequency, hematuria, flank pain, vaginal bleeding, vaginal discharge, difficulty urinating, vaginal pain and pelvic pain.  Musculoskeletal: Negative for joint swelling, arthralgias and gait problem.  Skin: Negative for rash.  Neurological: Positive for dizziness, weakness and light-headedness. Negative for syncope, speech difficulty, numbness and headaches.  Hematological: Negative for adenopathy.  Psychiatric/Behavioral: Negative for behavioral problems, dysphoric mood and agitation. The patient is not nervous/anxious.        Objective:   Physical Exam  Constitutional: She is oriented to person, place, and time. She appears well-developed and well-nourished.  Blood pressure 115/70  HENT:  Head: Normocephalic.  Right Ear: External ear normal.  Left Ear: External ear normal.  Mouth/Throat: Oropharynx is clear and moist.  Eyes: Conjunctivae and EOM are normal. Pupils  are equal, round, and reactive to light.  Neck: Normal range of motion. Neck supple. No thyromegaly present.  Cardiovascular: Normal rate, regular rhythm, normal heart sounds and intact distal pulses.   Pulmonary/Chest: Effort normal and breath sounds normal.  Abdominal: Soft. Bowel sounds are normal. She exhibits no mass. There is no tenderness.  Musculoskeletal: Normal range of motion.  Lymphadenopathy:    She has no cervical adenopathy.  Neurological: She is alert and oriented to person, place, and time.  Skin: Skin is warm and dry. No rash noted.    Psychiatric: She has a normal mood and affect. Her behavior is normal.          Assessment & Plan:   , essential hypertension.  Patient has had some documented readings in a low-normal range, associated with mild symptoms of dizziness and lightheadedness.  We'll discontinue diuretic therapy and continue close home blood pressure monitoring.  Will continue efforts at weight loss  .  Follow-up in 2 months as scheduled   [name], MD

## 2016-04-05 NOTE — Patient Instructions (Signed)
Discontinue losartan/hydrochlorothiazide combination Start playing losartan 100 mg daily  Limit your sodium (Salt) intake  Please check your blood pressure on a regular basis.  If it is consistently greater than 150/90 or less  than 110 over 70, please notify the office

## 2016-04-05 NOTE — Progress Notes (Signed)
   Subjective:    Patient ID: Rebecca Paul, female    DOB: Oct 24, 1955, 60 y.o.   MRN: [id number]  HPI  Wt Readings from Last 3 Encounters:  04/05/16 152 lb (68.947 kg)  05/26/15 159 lb (72.122 kg)  05/05/15 158 lb (71.668 kg)    Review of Systems     Objective:   Physical Exam        Assessment & Plan:

## 2016-05-24 ENCOUNTER — Other Ambulatory Visit (INDEPENDENT_AMBULATORY_CARE_PROVIDER_SITE_OTHER): Payer: BLUE CROSS/BLUE SHIELD

## 2016-05-24 DIAGNOSIS — Z0000 Encounter for general adult medical examination without abnormal findings: Secondary | ICD-10-CM | POA: Diagnosis not present

## 2016-05-24 LAB — CBC WITH DIFFERENTIAL/PLATELET
BASOS ABS: 0 10*3/uL (ref 0.0–0.1)
Basophils Relative: 0.6 % (ref 0.0–3.0)
Eosinophils Absolute: 0.1 10*3/uL (ref 0.0–0.7)
Eosinophils Relative: 1.6 % (ref 0.0–5.0)
HCT: 38.3 % (ref 36.0–46.0)
HEMOGLOBIN: 13.1 g/dL (ref 12.0–15.0)
LYMPHS ABS: 2.5 10*3/uL (ref 0.7–4.0)
Lymphocytes Relative: 34.4 % (ref 12.0–46.0)
MCHC: 34.3 g/dL (ref 30.0–36.0)
MCV: 80.2 fl (ref 78.0–100.0)
MONO ABS: 0.5 10*3/uL (ref 0.1–1.0)
Monocytes Relative: 7 % (ref 3.0–12.0)
NEUTROS PCT: 56.4 % (ref 43.0–77.0)
Neutro Abs: 4.1 10*3/uL (ref 1.4–7.7)
Platelets: 254 10*3/uL (ref 150.0–400.0)
RBC: 4.78 Mil/uL (ref 3.87–5.11)
RDW: 14.1 % (ref 11.5–15.5)
WBC: 7.2 10*3/uL (ref 4.0–10.5)

## 2016-05-24 LAB — POC URINALSYSI DIPSTICK (AUTOMATED)
BILIRUBIN UA: NEGATIVE
Glucose, UA: NEGATIVE
KETONES UA: NEGATIVE
Leukocytes, UA: NEGATIVE
Nitrite, UA: NEGATIVE
PH UA: 5
Protein, UA: NEGATIVE
SPEC GRAV UA: 1.02
Urobilinogen, UA: 0.2

## 2016-05-24 LAB — BASIC METABOLIC PANEL
BUN: 17 mg/dL (ref 6–23)
CALCIUM: 10.4 mg/dL (ref 8.4–10.5)
CO2: 24 meq/L (ref 19–32)
CREATININE: 0.84 mg/dL (ref 0.40–1.20)
Chloride: 105 mEq/L (ref 96–112)
GFR: 89.22 mL/min (ref >60.00)
GLUCOSE: 95 mg/dL (ref 70–99)
Potassium: 4 mEq/L (ref 3.5–5.1)
Sodium: 138 mEq/L (ref 135–145)

## 2016-05-24 LAB — LIPID PANEL
Cholesterol: 239 mg/dL — ABNORMAL HIGH (ref 0–200)
HDL: 53.5 mg/dL (ref >39.00)
LDL CALC: 168 mg/dL — AB (ref 0–99)
NONHDL: 185.68
Total CHOL/HDL Ratio: 4
Triglycerides: 86 mg/dL (ref 0.0–149.0)
VLDL: 17.2 mg/dL (ref 0.0–40.0)

## 2016-05-24 LAB — HEPATIC FUNCTION PANEL
ALK PHOS: 45 U/L (ref 39–117)
ALT: 14 U/L (ref 0–35)
AST: 15 U/L (ref 0–37)
Albumin: 4.1 g/dL (ref 3.5–5.2)
BILIRUBIN TOTAL: 0.5 mg/dL (ref 0.2–1.2)
Bilirubin, Direct: 0.1 mg/dL (ref 0.0–0.3)
Total Protein: 7.9 g/dL (ref 6.0–8.3)

## 2016-05-24 LAB — TSH: TSH: 2.43 u[IU]/mL (ref 0.35–4.50)

## 2016-05-24 LAB — HEMOGLOBIN A1C: Hgb A1c MFr Bld: 5.4 % (ref 4.6–6.5)

## 2016-05-31 ENCOUNTER — Ambulatory Visit (INDEPENDENT_AMBULATORY_CARE_PROVIDER_SITE_OTHER): Payer: BLUE CROSS/BLUE SHIELD | Admitting: Internal Medicine

## 2016-05-31 ENCOUNTER — Encounter: Payer: Self-pay | Admitting: Internal Medicine

## 2016-05-31 VITALS — BP 110/80 | HR 52 | Temp 97.4°F | Ht 60.5 in | Wt 152.0 lb

## 2016-05-31 DIAGNOSIS — Z0000 Encounter for general adult medical examination without abnormal findings: Secondary | ICD-10-CM

## 2016-05-31 MED ORDER — LOSARTAN POTASSIUM 100 MG PO TABS: 50.0000 mg | ORAL_TABLET | Freq: Every day | ORAL | 3 refills | Status: DC

## 2016-05-31 NOTE — Progress Notes (Signed)
Subjective:    Patient ID: Rebecca Paul, female    DOB: Aug 18, 1956, 60 y.o.   MRN: [id number]  HPI   60 year-old patient who is seen today for a wellness exam.  She has a history of hypertension and mild dyslipidemia.  She has occasional low back pain, but in general doing quite well.  She does have annual mammograms.  Colonoscopy was in 2009.  No concerns or complaints today. She was seen 2 months ago on diuretic therapy discontinued due to mild hypotension associated with dizziness.      Past Medical History:  Diagnosis Date  . Headache(784.0)   . Hyperlipidemia   . Hypertension   . IGT (impaired glucose tolerance)   . Kidney stone     Social History   Social History  . Marital status: Married    Spouse name: N/A  . Number of children: N/A  . Years of education: N/A   Occupational History  . Not on file.   Social History Main Topics  . Smoking status: Never Smoker  . Smokeless tobacco: Never Used  . Alcohol use No  . Drug use: No  . Sexual activity: Not on file   Other Topics Concern  . Not on file   Social History Narrative  . No narrative on file    Past Surgical History:  Procedure Laterality Date  . ABDOMINAL HYSTERECTOMY    . TONSILLECTOMY AND ADENOIDECTOMY      Family History  Problem Relation Age of Onset  . Hypertension Mother   . Hyperlipidemia Mother   . Thyroid disease Mother   . Cancer Father     prostate  . Obesity Sister   . Diabetes Other   . Hyperlipidemia Other   . Hypertension Other   . Cancer Other     prostate  . Kidney disease Other   . Obesity Sister   . Heart attack Sister     No Known Allergies  Current Outpatient Prescriptions on File Prior to Visit  Medication Sig Dispense Refill  . losartan (COZAAR) 100 MG tablet Take 1 tablet (100 mg total) by mouth daily. 90 tablet 3  . famciclovir (FAMVIR) 250 MG tablet Take 250 mg by mouth 2 (two) times daily as needed.     No current facility-administered medications  on file prior to visit.     BP 110/80 (BP Location: Right Arm, Patient Position: Sitting, Cuff Size: Normal)   Pulse (!) 52   Temp 97.4 F (36.3 C) (Oral)   Ht 5' 0.5" (1.537 m)   Wt 152 lb (68.9 kg)   SpO2 97%   BMI 29.20 kg/m     Review of Systems  Constitutional: Negative.   HENT: Negative for congestion, dental problem, hearing loss, rhinorrhea, sinus pressure, sore throat and tinnitus.   Eyes: Negative for pain, discharge and visual disturbance.  Respiratory: Negative for cough and shortness of breath.   Cardiovascular: Negative for chest pain, palpitations and leg swelling.  Gastrointestinal: Negative for abdominal distention, abdominal pain, blood in stool, constipation, diarrhea, nausea and vomiting.  Genitourinary: Negative for difficulty urinating, dysuria, flank pain, frequency, hematuria, pelvic pain, urgency, vaginal bleeding, vaginal discharge and vaginal pain.  Musculoskeletal: Positive for back pain. Negative for arthralgias, gait problem and joint swelling.  Skin: Negative for rash.  Neurological: Negative for dizziness, syncope, speech difficulty, weakness, numbness and headaches.  Hematological: Negative for adenopathy.  Psychiatric/Behavioral: Negative for agitation, behavioral problems and dysphoric mood. The patient is not nervous/anxious.  Objective:   Physical Exam  Constitutional: She is oriented to person, place, and time. She appears well-developed and well-nourished.  HENT:  Head: Normocephalic and atraumatic.  Right Ear: External ear normal.  Left Ear: External ear normal.  Mouth/Throat: Oropharynx is clear and moist.  Minimal wax right canal  Eyes: Conjunctivae and EOM are normal.  Neck: Normal range of motion. Neck supple. No JVD present. No thyromegaly present.  Mild thyromegaly  Cardiovascular: Normal rate, regular rhythm, normal heart sounds and intact distal pulses.   No murmur heard. Pulmonary/Chest: Effort normal and breath  sounds normal. She has no wheezes. She has no rales.  Abdominal: Soft. Bowel sounds are normal. She exhibits no distension and no mass. There is no tenderness. There is no rebound and no guarding.  Musculoskeletal: Normal range of motion. She exhibits no edema or tenderness.  Neurological: She is alert and oriented to person, place, and time. She has normal reflexes. No cranial nerve deficit. She exhibits normal muscle tone. Coordination normal.  Skin: Skin is warm and dry. No rash noted.  Psychiatric: She has a normal mood and affect. Her behavior is normal.          Assessment & Plan:   Preventive health exam Hypertension, stable.  Blood pressure readings today are in a low-normal range.  Diuretic therapy discontinued 2 months ago due to hypotension.  The patient has lost weight and is adhering to a much better lifestyle.  She wishes a trial off medication.  Will decrease losartan to 50 mg daily and if blood pressure remains less than 1:30 over 90.  We'll consider a trial off medication  History of mild dyslipidemia Intermittent low back pain   Continue home blood pressure monitoring Mammogram this fall as scheduled Follow-up colonoscopy 2019 Recheck here 6-12 months  [name], MD

## 2016-05-31 NOTE — Progress Notes (Signed)
Pre visit review using our clinic review tool, if applicable. No additional management support is needed unless otherwise documented below in the visit note. 

## 2016-05-31 NOTE — Patient Instructions (Signed)
Limit your sodium (Salt) intake    It is important that you exercise regularly, at least 20 minutes 3 to 4 times per week.  If you develop chest pain or shortness of breath seek  medical attention.  You need to lose weight.  Consider a lower calorie diet and regular exercise.  Please check your blood pressure on a regular basis.  If it is consistently greater than 140/90, please make an office appointment.   

## 2016-05-31 NOTE — Progress Notes (Signed)
   Subjective:    Patient ID: Rebecca Paul, female    DOB: 1955-12-27, 60 y.o.   MRN: [id number]  HPI  Wt Readings from Last 3 Encounters:  05/31/16 152 lb (68.9 kg)  04/05/16 152 lb (68.9 kg)  05/26/15 159 lb (72.1 kg)    Review of Systems As above    Objective:   Physical Exam   As above     Assessment & Plan:  As above

## 2016-08-30 ENCOUNTER — Ambulatory Visit (INDEPENDENT_AMBULATORY_CARE_PROVIDER_SITE_OTHER): Payer: BLUE CROSS/BLUE SHIELD | Admitting: Internal Medicine

## 2016-08-30 ENCOUNTER — Encounter: Payer: Self-pay | Admitting: Internal Medicine

## 2016-08-30 VITALS — BP 138/96 | HR 56 | Temp 98.5°F | Ht 60.5 in | Wt 159.0 lb

## 2016-08-30 DIAGNOSIS — J069 Acute upper respiratory infection, unspecified: Secondary | ICD-10-CM | POA: Diagnosis not present

## 2016-08-30 DIAGNOSIS — B9789 Other viral agents as the cause of diseases classified elsewhere: Secondary | ICD-10-CM

## 2016-08-30 DIAGNOSIS — I10 Essential (primary) hypertension: Secondary | ICD-10-CM | POA: Diagnosis not present

## 2016-08-30 MED ORDER — HYDROCODONE-HOMATROPINE 5-1.5 MG/5ML PO SYRP: 5.0000 mL | ORAL_SOLUTION | Freq: Four times a day (QID) | ORAL | 0 refills | Status: DC | PRN

## 2016-08-30 NOTE — Patient Instructions (Signed)
Acute bronchitis symptoms for less than 10 days are generally not helped by antibiotics.  Take over-the-counter expectorants and cough medications such as  Mucinex DM.  Call if there is no improvement in 5 to 7 days or if  you develop worsening cough, fever, or new symptoms, such as shortness of breath or chest pain.   

## 2016-08-30 NOTE — Progress Notes (Signed)
Pre visit review using our clinic review tool, if applicable. No additional management support is needed unless otherwise documented below in the visit note. 

## 2016-08-30 NOTE — Progress Notes (Signed)
Subjective:    Patient ID: Rebecca Paul, female    DOB: 08-Dec-1955, 60 y.o.   MRN: [id number]  HPI  60 year old patient who has a history of essential hypertension.  She presents with a one-week history of sinus congestion, drainage, cough and postnasal drip.  She has been using Mucinex D with some benefit.  She is also obtained a humidifier.  Her cough seems to be improving.  There is been no fever.  Past Medical History:  Diagnosis Date  . Headache(784.0)   . Hyperlipidemia   . Hypertension   . IGT (impaired glucose tolerance)   . Kidney stone      Social History   Social History  . Marital status: Married    Spouse name: N/A  . Number of children: N/A  . Years of education: N/A   Occupational History  . Not on file.   Social History Main Topics  . Smoking status: Never Smoker  . Smokeless tobacco: Never Used  . Alcohol use No  . Drug use: No  . Sexual activity: Not on file   Other Topics Concern  . Not on file   Social History Narrative  . No narrative on file    Past Surgical History:  Procedure Laterality Date  . ABDOMINAL HYSTERECTOMY    . TONSILLECTOMY AND ADENOIDECTOMY      Family History  Problem Relation Age of Onset  . Hypertension Mother   . Hyperlipidemia Mother   . Thyroid disease Mother   . Cancer Father     prostate  . Obesity Sister   . Diabetes Other   . Hyperlipidemia Other   . Hypertension Other   . Cancer Other     prostate  . Kidney disease Other   . Obesity Sister   . Heart attack Sister     No Known Allergies  Current Outpatient Prescriptions on File Prior to Visit  Medication Sig Dispense Refill  . famciclovir (FAMVIR) 250 MG tablet Take 250 mg by mouth 2 (two) times daily as needed.    Marland Kitchen losartan (COZAAR) 100 MG tablet Take 0.5 tablets (50 mg total) by mouth daily. 90 tablet 3   No current facility-administered medications on file prior to visit.     BP (!) 138/96 (BP Location: Right Arm, Patient Position:  Sitting, Cuff Size: Normal)   Pulse (!) 56   Temp 98.5 F (36.9 C) (Oral)   Ht 5' 0.5" (1.537 m)   Wt 159 lb (72.1 kg)   SpO2 98%   BMI 30.54 kg/m    Review of Systems  Constitutional: Positive for activity change, appetite change and fatigue.  HENT: Positive for congestion, postnasal drip and rhinorrhea. Negative for dental problem, hearing loss, sinus pressure, sore throat and tinnitus.   Eyes: Negative for pain, discharge and visual disturbance.  Respiratory: Positive for cough. Negative for shortness of breath.   Cardiovascular: Negative for chest pain, palpitations and leg swelling.  Gastrointestinal: Negative for abdominal distention, abdominal pain, blood in stool, constipation, diarrhea, nausea and vomiting.  Genitourinary: Negative for difficulty urinating, dysuria, flank pain, frequency, hematuria, pelvic pain, urgency, vaginal bleeding, vaginal discharge and vaginal pain.  Musculoskeletal: Negative for arthralgias, gait problem and joint swelling.  Skin: Negative for rash.  Neurological: Negative for dizziness, syncope, speech difficulty, weakness, numbness and headaches.  Hematological: Negative for adenopathy.  Psychiatric/Behavioral: Negative for agitation, behavioral problems and dysphoric mood. The patient is not nervous/anxious.        Objective:   Physical  Exam  Constitutional: She is oriented to person, place, and time. She appears well-developed and well-nourished.  HENT:  Head: Normocephalic.  Right Ear: External ear normal.  Left Ear: External ear normal.  Mouth/Throat: Oropharynx is clear and moist.  Eyes: Conjunctivae and EOM are normal. Pupils are equal, round, and reactive to light.  Neck: Normal range of motion. Neck supple. No thyromegaly present.  Cardiovascular: Normal rate, regular rhythm, normal heart sounds and intact distal pulses.   Pulmonary/Chest: Effort normal and breath sounds normal. No respiratory distress. She has no wheezes.    Abdominal: Soft. Bowel sounds are normal. She exhibits no mass. There is no tenderness.  Musculoskeletal: Normal range of motion.  Lymphadenopathy:    She has no cervical adenopathy.  Neurological: She is alert and oriented to person, place, and time.  Skin: Skin is warm and dry. No rash noted.  Psychiatric: She has a normal mood and affect. Her behavior is normal.          Assessment & Plan:   Viral URI with cough.  Will continue symptomatic treatment.  Patient reassured Essential hypertension.  Blood pressure up slightly today, probably secondary to decongestant an acute illness.  We'll continue home blood pressure monitoring  Rebecca Paul Pilar Plate

## 2016-10-04 DIAGNOSIS — Z6829 Body mass index (BMI) 29.0-29.9, adult: Secondary | ICD-10-CM | POA: Diagnosis not present

## 2016-10-04 DIAGNOSIS — Z1231 Encounter for screening mammogram for malignant neoplasm of breast: Secondary | ICD-10-CM | POA: Diagnosis not present

## 2016-10-04 DIAGNOSIS — Z01419 Encounter for gynecological examination (general) (routine) without abnormal findings: Secondary | ICD-10-CM | POA: Diagnosis not present

## 2016-11-08 ENCOUNTER — Telehealth: Payer: Self-pay | Admitting: Internal Medicine

## 2016-11-08 ENCOUNTER — Other Ambulatory Visit: Payer: Self-pay | Admitting: Internal Medicine

## 2016-11-08 MED ORDER — FAMCICLOVIR 250 MG PO TABS: 250.0000 mg | ORAL_TABLET | Freq: Two times a day (BID) | ORAL | 1 refills | Status: DC | PRN

## 2016-11-08 MED ORDER — LOSARTAN POTASSIUM 100 MG PO TABS: 100.0000 mg | ORAL_TABLET | Freq: Every day | ORAL | 3 refills | Status: DC

## 2016-11-08 NOTE — Telephone Encounter (Signed)
Pt need new Rx for losartan pt state that she is taking a full tablet instead of the 1/2 tablet per Dr. Raliegh Ip and also need famciclovir.  Pharm:  [facility] [address]

## 2016-11-08 NOTE — Telephone Encounter (Signed)
Rx were refilled to [facility] off of [address]

## 2016-11-16 ENCOUNTER — Encounter: Payer: Self-pay | Admitting: Internal Medicine

## 2016-11-16 ENCOUNTER — Ambulatory Visit (INDEPENDENT_AMBULATORY_CARE_PROVIDER_SITE_OTHER): Payer: BLUE CROSS/BLUE SHIELD | Admitting: Internal Medicine

## 2016-11-16 VITALS — BP 142/76 | HR 80 | Temp 98.6°F | Ht 65.0 in | Wt 158.6 lb

## 2016-11-16 DIAGNOSIS — J111 Influenza due to unidentified influenza virus with other respiratory manifestations: Secondary | ICD-10-CM | POA: Diagnosis not present

## 2016-11-16 DIAGNOSIS — H6121 Impacted cerumen, right ear: Secondary | ICD-10-CM | POA: Diagnosis not present

## 2016-11-16 MED ORDER — AMPHETAMINE-DEXTROAMPHETAMINE 20 MG PO TABS: 20.0000 mg | ORAL_TABLET | Freq: Two times a day (BID) | ORAL | 0 refills | Status: DC

## 2016-11-16 MED ORDER — HYDROCODONE-HOMATROPINE 5-1.5 MG/5ML PO SYRP: 5.0000 mL | ORAL_SOLUTION | Freq: Four times a day (QID) | ORAL | 0 refills | Status: DC | PRN

## 2016-11-16 NOTE — Patient Instructions (Addendum)
Influenza, Adult Influenza, more commonly known as "the flu," is a viral infection that primarily affects the respiratory tract. The respiratory tract includes organs that help you breathe, such as the lungs, nose, and throat. The flu causes many common cold symptoms, as well as a high fever and body aches. The flu spreads easily from person to person (is contagious). Getting a flu shot (influenza vaccination) every year is the best way to prevent influenza. What are the causes? Influenza is caused by a virus. You can catch the virus by:  Breathing in droplets from an infected person's cough or sneeze.  Touching something that was recently contaminated with the virus and then touching your mouth, nose, or eyes.  What increases the risk? The following factors may make you more likely to get the flu:  Not cleaning your hands frequently with soap and water or alcohol-based hand sanitizer.  Having close contact with many people during cold and flu season.  Touching your mouth, eyes, or nose without washing or sanitizing your hands first.  Not drinking enough fluids or not eating a healthy diet.  Not getting enough sleep or exercise.  Being under a high amount of stress.  Not getting a yearly (annual) flu shot.  You may be at a higher risk of complications from the flu, such as a severe lung infection (pneumonia), if you:  Are over the age of 65.  Are pregnant.  Have a weakened disease-fighting system (immune system). You may have a weakened immune system if you: ? Have HIV or AIDS. ? Are undergoing chemotherapy. ? Aretaking medicines that reduce the activity of (suppress) the immune system.  Have a long-term (chronic) illness, such as heart disease, kidney disease, diabetes, or lung disease.  Have a liver disorder.  Are obese.  Have anemia.  What are the signs or symptoms? Symptoms of this condition typically last 4-10 days and may  include:  Fever.  Chills.  Headache, body aches, or muscle aches.  Sore throat.  Cough.  Runny or congested nose.  Chest discomfort and cough.  Poor appetite.  Weakness or tiredness (fatigue).  Dizziness.  Nausea or vomiting.  How is this diagnosed? This condition may be diagnosed based on your medical history and a physical exam. Your health care provider may do a nose or throat swab test to confirm the diagnosis. How is this treated? If influenza is detected early, you can be treated with antiviral medicine that can reduce the length of your illness and the severity of your symptoms. This medicine may be given by mouth (orally) or through an IV tube that is inserted in one of your veins. The goal of treatment is to relieve symptoms by taking care of yourself at home. This may include taking over-the-counter medicines, drinking plenty of fluids, and adding humidity to the air in your home. In some cases, influenza goes away on its own. Severe influenza or complications from influenza may be treated in a hospital. Follow these instructions at home:  Take over-the-counter and prescription medicines only as told by your health care provider.  Use a cool mist humidifier to add humidity to the air in your home. This can make breathing easier.  Rest as needed.  Drink enough fluid to keep your urine clear or pale yellow.  Cover your mouth and nose when you cough or sneeze.  Wash your hands with soap and water often, especially after you cough or sneeze. If soap and water are not available, use hand sanitizer.    work or school as told by your health care provider. Unless you are visiting your health care provider, try to avoid leaving home until your fever has been gone for 24 hours without the use of medicine.  Keep all follow-up visits as told by your health care provider. This is important. How is this prevented?  Getting an annual flu shot is the best way to avoid getting the flu. You may get the flu shot  in late summer, fall, or winter. Ask your health care provider when you should get your flu shot.  Wash your hands often or use hand sanitizer often.  Avoid contact with people who are sick during cold and flu season.  Eat a healthy diet, drink plenty of fluids, get enough sleep, and exercise regularly. Contact a health care provider if:  You develop new symptoms.  You have:  Chest pain.  Diarrhea.  A fever.  Your cough gets worse.  You produce more mucus.  You feel nauseous or you vomit. Get help right away if:  You develop shortness of breath or difficulty breathing.  Your skin or nails turn a bluish color.  You have severe pain or stiffness in your neck.  You develop a sudden headache or sudden pain in your face or ear.  You cannot stop vomiting. This information is not intended to replace advice given to you by your health care provider. Make sure you discuss any questions you have with your health care provider. Document Released: 10/01/2000 Document Revised: 03/11/2016 Document Reviewed: 07/29/2015 Elsevier Interactive Patient Education  2017 [place] Your ears make a substance called earwax. It may also be called cerumen. Sometimes, too much earwax builds up in your ear canal. This can cause ear pain and make it harder for you to hear. CAUSES This condition is caused by too much earwax production or buildup. RISK FACTORS The following factors may make you more likely to develop this condition:  Cleaning your ears often with swabs.  Having narrow ear canals.  Having earwax that is overly thick or sticky.  Having eczema.  Being dehydrated. SYMPTOMS Symptoms of this condition include:  Reduced hearing.  Ear drainage.  Ear pain.  Ear itch.  A feeling of fullness in the ear or feeling that the ear is plugged.  Ringing in the ear.  Coughing. DIAGNOSIS Your health care provider can diagnose this condition based on your  symptoms and medical history. Your health care provider will also do an ear exam to look inside your ear with a scope (otoscope). You may also have a hearing test. TREATMENT Treatment for this condition includes:  Over-the-counter or prescription ear drops to soften the earwax.  Earwax removal by a health care provider. This may be done:  By flushing the ear with body-temperature water.  With a medical instrument that has a loop at the end (earwax curette).  With a suction device. HOME CARE INSTRUCTIONS  Take over-the-counter and prescription medicines only as told by your health care provider.  Do not put any objects, including an ear swab, into your ear. You can clean the opening of your ear canal with a washcloth.  Drink enough water to keep your urine clear or pale yellow.  If you have frequent earwax buildup or you use hearing aids, consider seeing your health care provider every 6-12 months for routine preventive ear cleanings. Keep all follow-up visits as told by your health care provider. SEEK MEDICAL CARE IF:  You have ear  pain.  Your condition does not improve with treatment.  You have hearing loss.  You have blood, pus, or other fluid coming from your ear. This information is not intended to replace advice given to you by your health care provider. Make sure you discuss any questions you have with your health care provider. Document Released: 11/11/2004 Document Revised: 01/26/2016 Document Reviewed: 05/21/2015 Elsevier Interactive Patient Education  2017 [place].   Acute bronchitis symptoms for less than 10 days are generally not helped by antibiotics.  Take over-the-counter expectorants and cough medications such as  Mucinex DM.  Call if there is no improvement in 5 to 7 days or if  you develop worsening cough, fever, or new symptoms, such as shortness of breath or chest pain.  Hydrate and Humidify  Drink enough water to keep your urine clear or pale yellow.  Staying hydrated will help to thin your mucus.  Use a cool mist humidifier to keep the humidity level in your home above 50%.  Inhale steam for 10-15 minutes, 3-4 times a day or as told by your health care provider. You can do this in the bathroom while a hot shower is running.  Limit your exposure to cool or dry air. Rest  Rest as much as possible. On [place] to things this, but she was rechecked for the flu and her to the lab associated with flu and also her right ear is

## 2016-11-16 NOTE — Progress Notes (Signed)
Pre visit review using our clinic review tool, if applicable. No additional management support is needed unless otherwise documented below in the visit note. 

## 2016-11-16 NOTE — Progress Notes (Signed)
Subjective:    Patient ID: Rebecca Paul, female    DOB: 1955-12-04, 61 y.o.   MRN: [id number]  HPI  61 year old patient who has essential hypertension.  She presents with a three-day history of myalgias, sneezing, nasal congestionlargely nonproductive cough.  No documented fever.  No obvious influenza exposure.  She also complains of decreased auditory acuity from the right ear  Past Medical History:  Diagnosis Date  . Headache(784.0)   . Hyperlipidemia   . Hypertension   . IGT (impaired glucose tolerance)   . Kidney stone      Social History   Social History  . Marital status: Married    Spouse name: N/A  . Number of children: N/A  . Years of education: N/A   Occupational History  . Not on file.   Social History Main Topics  . Smoking status: Never Smoker  . Smokeless tobacco: Never Used  . Alcohol use No  . Drug use: No  . Sexual activity: Not on file   Other Topics Concern  . Not on file   Social History Narrative  . No narrative on file    Past Surgical History:  Procedure Laterality Date  . ABDOMINAL HYSTERECTOMY    . TONSILLECTOMY AND ADENOIDECTOMY      Family History  Problem Relation Age of Onset  . Hypertension Mother   . Hyperlipidemia Mother   . Thyroid disease Mother   . Cancer Father     prostate  . Obesity Sister   . Diabetes Other   . Hyperlipidemia Other   . Hypertension Other   . Cancer Other     prostate  . Kidney disease Other   . Obesity Sister   . Heart attack Sister     No Known Allergies  Current Outpatient Prescriptions on File Prior to Visit  Medication Sig Dispense Refill  . famciclovir (FAMVIR) 250 MG tablet Take 1 tablet (250 mg total) by mouth 2 (two) times daily as needed. 90 tablet 1  . HYDROcodone-homatropine (HYCODAN) 5-1.5 MG/5ML syrup Take 5 mLs by mouth every 6 (six) hours as needed for cough. 120 mL 0  . losartan (COZAAR) 100 MG tablet Take 1 tablet (100 mg total) by mouth daily. 90 tablet 3   No  current facility-administered medications on file prior to visit.     BP (!) 142/76 (BP Location: Left Arm, Patient Position: Sitting, Cuff Size: Normal)   Pulse 80   Temp 98.6 F (37 C) (Oral)   Ht 5\' 5"  (1.651 m)   Wt 158 lb 9.6 oz (71.9 kg)   SpO2 98%   BMI 26.39 kg/m     Review of Systems  Constitutional: Positive for activity change, appetite change and fatigue.  HENT: Positive for congestion, hearing loss, postnasal drip, rhinorrhea and sore throat. Negative for dental problem, sinus pressure and tinnitus.   Eyes: Negative for pain, discharge and visual disturbance.  Respiratory: Positive for cough. Negative for shortness of breath.   Cardiovascular: Negative for chest pain, palpitations and leg swelling.  Gastrointestinal: Negative for abdominal distention, abdominal pain, blood in stool, constipation, diarrhea, nausea and vomiting.  Genitourinary: Negative for difficulty urinating, dysuria, flank pain, frequency, hematuria, pelvic pain, urgency, vaginal bleeding, vaginal discharge and vaginal pain.  Musculoskeletal: Negative for arthralgias, gait problem and joint swelling.  Skin: Negative for rash.  Neurological: Negative for dizziness, syncope, speech difficulty, weakness, numbness and headaches.  Hematological: Negative for adenopathy.  Psychiatric/Behavioral: Negative for agitation, behavioral problems and dysphoric mood.  The patient is not nervous/anxious.        Objective:   Physical Exam  Constitutional: She is oriented to person, place, and time. She appears well-developed and well-nourished.  HENT:  Head: Normocephalic.  Right Ear: External ear normal.  Left Ear: External ear normal.  Mouth/Throat: Oropharynx is clear and moist.  Waxing.  Right canal  Eyes: Conjunctivae and EOM are normal. Pupils are equal, round, and reactive to light.  Neck: Normal range of motion. Neck supple. No thyromegaly present.  Cardiovascular: Normal rate, regular rhythm, normal  heart sounds and intact distal pulses.   Pulmonary/Chest: Effort normal and breath sounds normal.  Abdominal: Soft. Bowel sounds are normal. She exhibits no mass. There is no tenderness.  Musculoskeletal: Normal range of motion.  Lymphadenopathy:    She has no cervical adenopathy.  Neurological: She is alert and oriented to person, place, and time.  Skin: Skin is warm and dry. No rash noted.  Psychiatric: She has a normal mood and affect. Her behavior is normal.          Assessment & Plan:   Flu syndrome.  Options discussed.  Patient strongly desires diagnostic test for influenza A/B Will treat symptomatically.  Will offer treatment if positive for influenza Right cerumen impaction.  Right canal irrigated until clear  [name]

## 2017-02-14 ENCOUNTER — Ambulatory Visit: Payer: BLUE CROSS/BLUE SHIELD | Admitting: Internal Medicine

## 2017-02-21 DIAGNOSIS — M4316 Spondylolisthesis, lumbar region: Secondary | ICD-10-CM | POA: Diagnosis not present

## 2017-02-21 DIAGNOSIS — M545 Low back pain: Secondary | ICD-10-CM | POA: Diagnosis not present

## 2017-02-21 DIAGNOSIS — M25562 Pain in left knee: Secondary | ICD-10-CM | POA: Diagnosis not present

## 2017-02-21 DIAGNOSIS — M5431 Sciatica, right side: Secondary | ICD-10-CM | POA: Diagnosis not present

## 2017-02-28 ENCOUNTER — Encounter: Payer: Self-pay | Admitting: Internal Medicine

## 2017-02-28 ENCOUNTER — Ambulatory Visit (INDEPENDENT_AMBULATORY_CARE_PROVIDER_SITE_OTHER): Payer: BLUE CROSS/BLUE SHIELD | Admitting: Internal Medicine

## 2017-02-28 VITALS — BP 110/80 | HR 68 | Temp 97.2°F | Ht 65.0 in | Wt 156.8 lb

## 2017-02-28 DIAGNOSIS — I10 Essential (primary) hypertension: Secondary | ICD-10-CM | POA: Diagnosis not present

## 2017-02-28 DIAGNOSIS — E739 Lactose intolerance, unspecified: Secondary | ICD-10-CM | POA: Diagnosis not present

## 2017-02-28 LAB — POCT GLYCOSYLATED HEMOGLOBIN (HGB A1C): HEMOGLOBIN A1C: 6.2

## 2017-02-28 MED ORDER — FLUTICASONE PROPIONATE 50 MCG/ACT NA SUSP: 2.0000 | Freq: Every day | NASAL | 6 refills | Status: DC

## 2017-02-28 NOTE — Patient Instructions (Addendum)
Limit your sodium (Salt) intake  Please check your blood pressure on a regular basis.  If it is consistently greater than 150/90, please make an office appointment.    It is important that you exercise regularly, at least 20 minutes 3 to 4 times per week.  If you develop chest pain or shortness of breath seek  medical attention.  Use a once a day antihistamine such as Zyrtec or Allegra Continue fluticasone nasal spray  Return in 6 months for follow-up  Hydrate and Humidify   Drink enough water to keep your urine clear or pale yellow. Staying hydrated will help to thin your mucus.  Use a cool mist humidifier to keep the humidity level in your home above 50%.  Inhale steam for 10-15 minutes, 3-4 times a day or as told by your health care provider. You can do this in the bathroom while a hot shower is running.  Limit your exposure to cool or dry air.

## 2017-02-28 NOTE — Progress Notes (Signed)
Subjective:    Patient ID: Rebecca Paul, female    DOB: [birth year], 61 y.o.   MRN: [id number]  HPI  61 year old patient who has essential hypertension.  She had a health screening hemoglobin A1c provided at work that revealed a hemoglobin A1c of 7.4.  These have consistently been elevated at work, but here in our office.  Hemoglobin A1c's have been in a nondiabetic range.  Her last hemoglobin A1c last fall was 5.4  POC hemoglobin A1c today.  6.2  Her main complaint today is postnasal drip with cough  Past Medical History:  Diagnosis Date  . Headache(784.0)   . Hyperlipidemia   . Hypertension   . IGT (impaired glucose tolerance)   . Kidney stone      Social History   Social History  . Marital status: Married    Spouse name: N/A  . Number of children: N/A  . Years of education: N/A   Occupational History  . Not on file.   Social History Main Topics  . Smoking status: Never Smoker  . Smokeless tobacco: Never Used  . Alcohol use No  . Drug use: No  . Sexual activity: Not on file   Other Topics Concern  . Not on file   Social History Narrative  . No narrative on file    Past Surgical History:  Procedure Laterality Date  . ABDOMINAL HYSTERECTOMY    . TONSILLECTOMY AND ADENOIDECTOMY      Family History  Problem Relation Age of Onset  . Hypertension Mother   . Hyperlipidemia Mother   . Thyroid disease Mother   . Cancer Father        prostate  . Obesity Sister   . Diabetes Other   . Hyperlipidemia Other   . Hypertension Other   . Cancer Other        prostate  . Kidney disease Other   . Obesity Sister   . Heart attack Sister     No Known Allergies  Current Outpatient Prescriptions on File Prior to Visit  Medication Sig Dispense Refill  . famciclovir (FAMVIR) 250 MG tablet Take 1 tablet (250 mg total) by mouth 2 (two) times daily as needed. 90 tablet 1  . losartan (COZAAR) 100 MG tablet Take 1 tablet (100 mg total) by mouth daily. 90 tablet 3   No  current facility-administered medications on file prior to visit.     BP 110/80 (BP Location: Left Arm, Patient Position: Sitting, Cuff Size: Normal)   Pulse 68   Temp 97.2 F (36.2 C) (Oral)   Ht 5\' 5"  (1.651 m)   Wt 156 lb 12.8 oz (71.1 kg)   SpO2 98%   BMI 26.09 kg/m     Review of Systems  Constitutional: Negative.   HENT: Positive for postnasal drip and rhinorrhea. Negative for congestion, dental problem, hearing loss, sinus pressure, sore throat and tinnitus.   Eyes: Negative for pain, discharge and visual disturbance.  Respiratory: Positive for cough. Negative for shortness of breath.   Cardiovascular: Negative for chest pain, palpitations and leg swelling.  Gastrointestinal: Negative for abdominal distention, abdominal pain, blood in stool, constipation, diarrhea, nausea and vomiting.  Genitourinary: Negative for difficulty urinating, dysuria, flank pain, frequency, hematuria, pelvic pain, urgency, vaginal bleeding, vaginal discharge and vaginal pain.  Musculoskeletal: Negative for arthralgias, gait problem and joint swelling.  Skin: Negative for rash.  Neurological: Negative for dizziness, syncope, speech difficulty, weakness, numbness and headaches.  Hematological: Negative for adenopathy.  Psychiatric/Behavioral: Negative  for agitation, behavioral problems and dysphoric mood. The patient is not nervous/anxious.        Objective:   Physical Exam  Constitutional: She is oriented to person, place, and time. She appears well-developed and well-nourished.  HENT:  Head: Normocephalic.  Right Ear: External ear normal.  Left Ear: External ear normal.  Mouth/Throat: Oropharynx is clear and moist.  Eyes: Conjunctivae and EOM are normal. Pupils are equal, round, and reactive to light.  Neck: Normal range of motion. Neck supple. No thyromegaly present.  Cardiovascular: Normal rate, regular rhythm, normal heart sounds and intact distal pulses.   Pulmonary/Chest: Effort normal  and breath sounds normal.  Abdominal: Soft. Bowel sounds are normal. She exhibits no mass. There is no tenderness.  Musculoskeletal: Normal range of motion.  Lymphadenopathy:    She has no cervical adenopathy.  Neurological: She is alert and oriented to person, place, and time.  Skin: Skin is warm and dry. No rash noted.  Psychiatric: She has a normal mood and affect. Her behavior is normal.          Assessment & Plan:   Essential hypertension, well-controlled Upper airway cough syndrome.  Will treat with an oral antihistamine and continue fluticasone nasal spray Impaired glucose tolerance.  Hemoglobin A1c 6.2  Follow-up 6 months  Rebecca Paul

## 2017-03-28 ENCOUNTER — Other Ambulatory Visit: Payer: Self-pay | Admitting: Nurse Practitioner

## 2017-03-28 DIAGNOSIS — N644 Mastodynia: Secondary | ICD-10-CM

## 2017-04-04 ENCOUNTER — Ambulatory Visit
Admission: RE | Admit: 2017-04-04 | Discharge: 2017-04-04 | Disposition: A | Discharge status: Home / Self Care | Payer: BLUE CROSS/BLUE SHIELD | Source: Ambulatory Visit | Attending: Nurse Practitioner | Admitting: Nurse Practitioner

## 2017-04-04 DIAGNOSIS — N644 Mastodynia: Secondary | ICD-10-CM

## 2017-04-04 DIAGNOSIS — R928 Other abnormal and inconclusive findings on diagnostic imaging of breast: Secondary | ICD-10-CM | POA: Diagnosis not present

## 2017-05-09 ENCOUNTER — Telehealth: Payer: Self-pay | Admitting: Internal Medicine

## 2017-05-09 NOTE — Telephone Encounter (Signed)
Please advise 

## 2017-05-09 NOTE — Telephone Encounter (Signed)
Pt is stating that she is going to [place] and would like to get the malaria pills for 7 days (medication for mosquitos and water).  Pts trip is August 13

## 2017-05-09 NOTE — Telephone Encounter (Signed)
Generic Malarone 250/100 #16  One tablet daily starting 2 days prior to departure and continuing for 7 days following return from exposure

## 2017-05-11 MED ORDER — ATOVAQUONE-PROGUANIL HCL 250-100 MG PO TABS: ORAL_TABLET | ORAL | 1 refills | Status: DC

## 2017-05-13 NOTE — Telephone Encounter (Signed)
Called pt with no answer, Left message on voicemail to call office. Will printout phone note and mail to patient's home.

## 2017-06-13 ENCOUNTER — Encounter: Payer: BLUE CROSS/BLUE SHIELD | Admitting: Internal Medicine

## 2017-06-24 IMAGING — MG 2D DIGITAL DIAGNOSTIC UNILATERAL LEFT MAMMOGRAM WITH CAD AND ADJ
6 series · 6 of 14 positions shown · non-contrast
Comparison: Previous exam(s).

CLINICAL DATA: Left breast pain.

EXAM:
2D DIGITAL DIAGNOSTIC UNILATERAL LEFT MAMMOGRAM WITH CAD AND ADJUNCT
TOMO

[L CC]
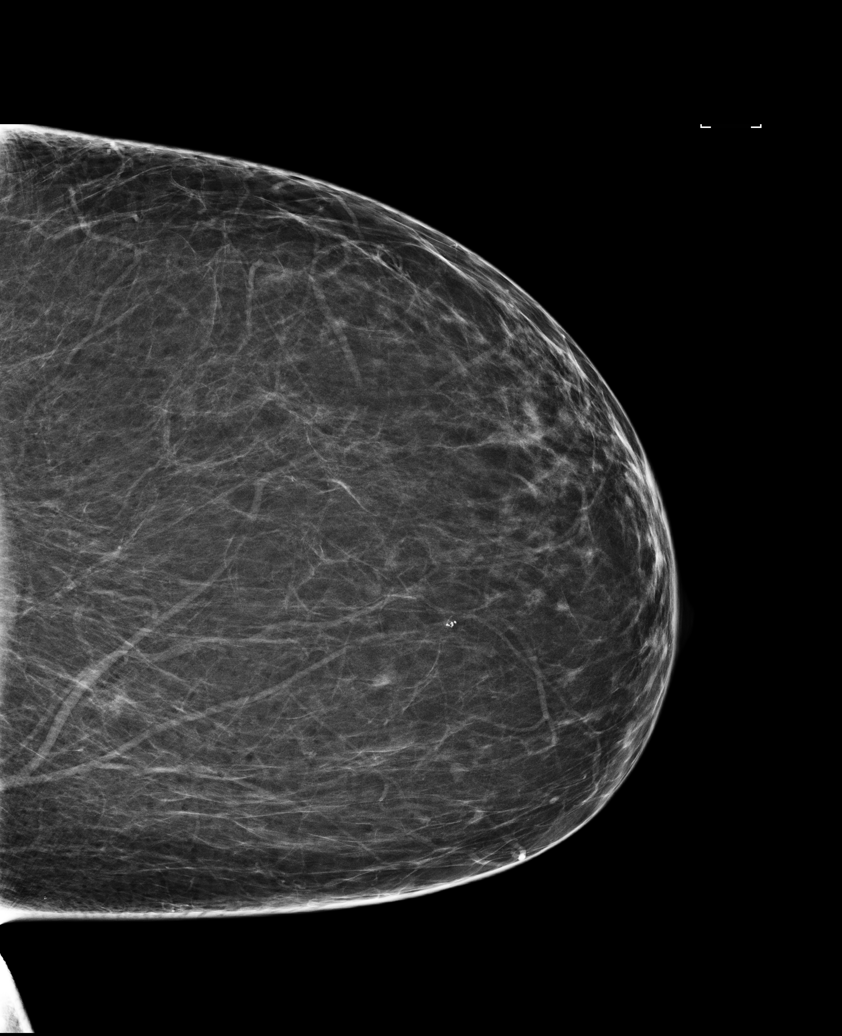

[L MLO]
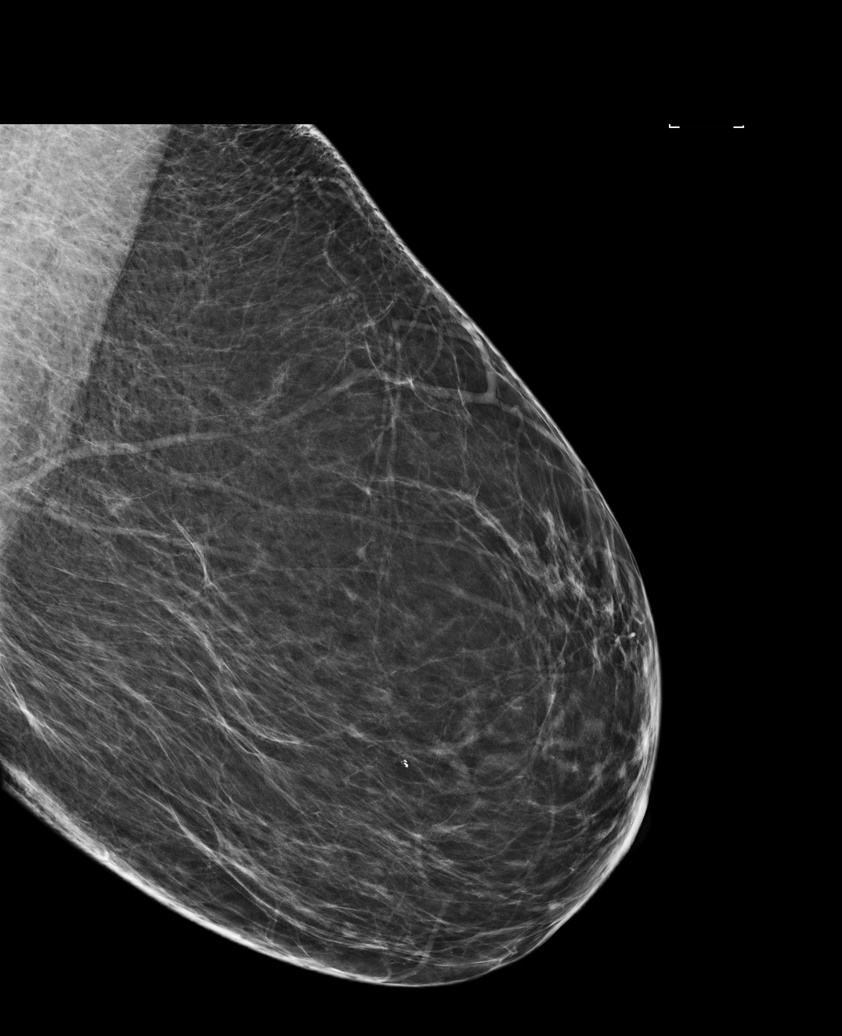

[L MLO synth-2D]
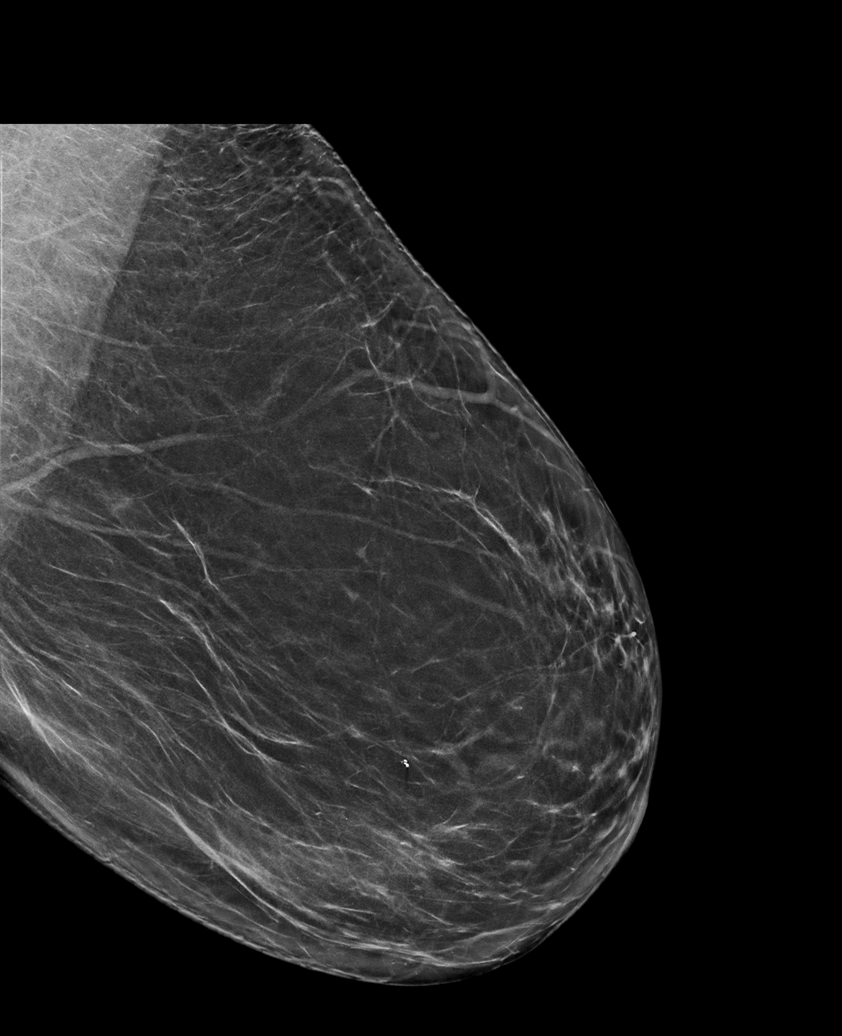

[L CC synth-2D]
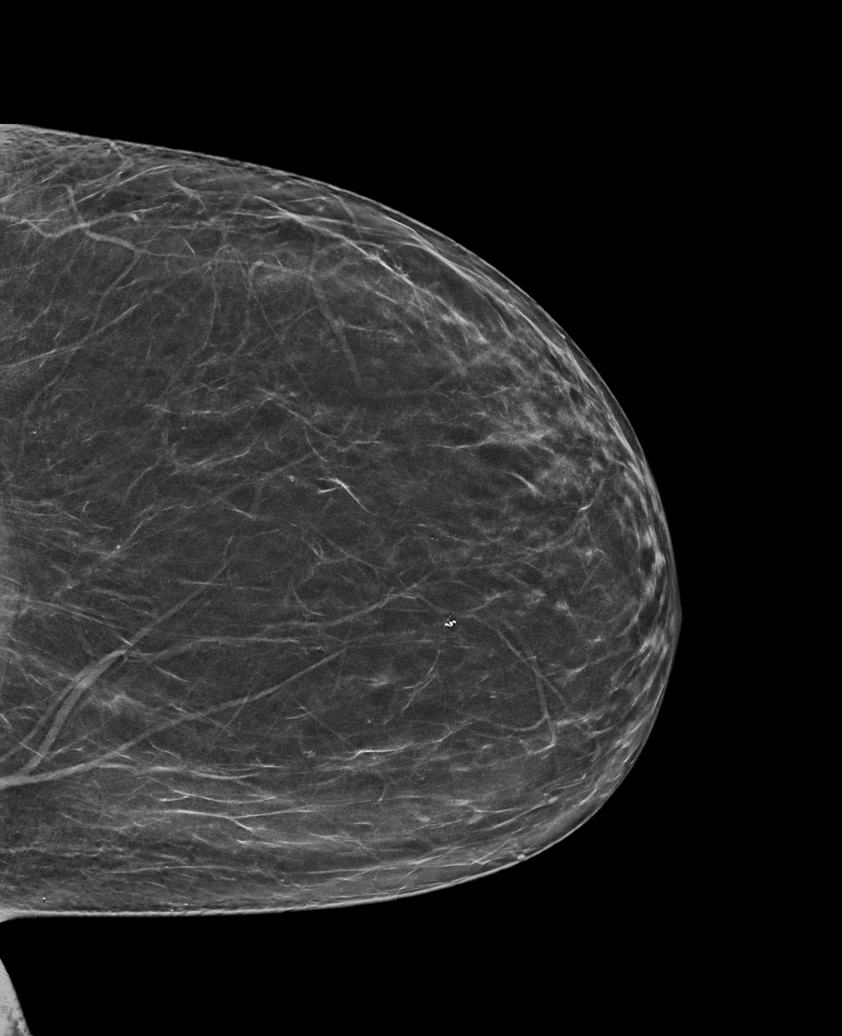

[L MLO tomo · tomo slice 39/77.0]
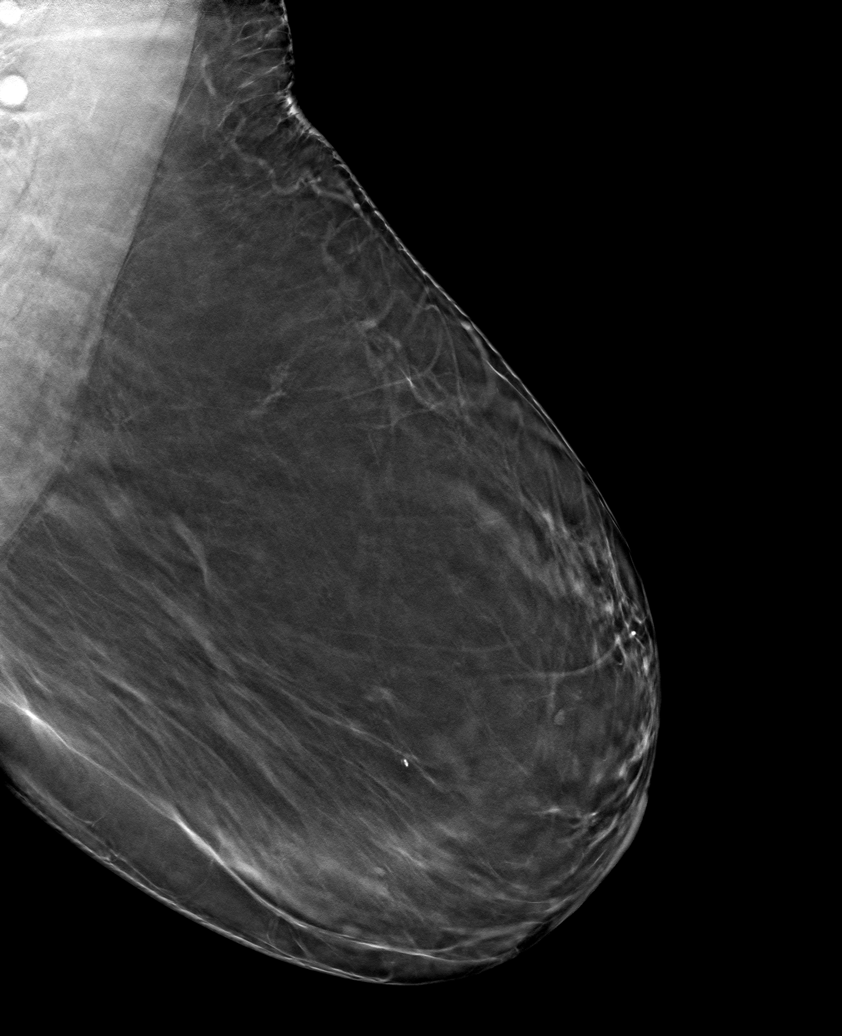

[L CC tomo · tomo slice 35/70.0]
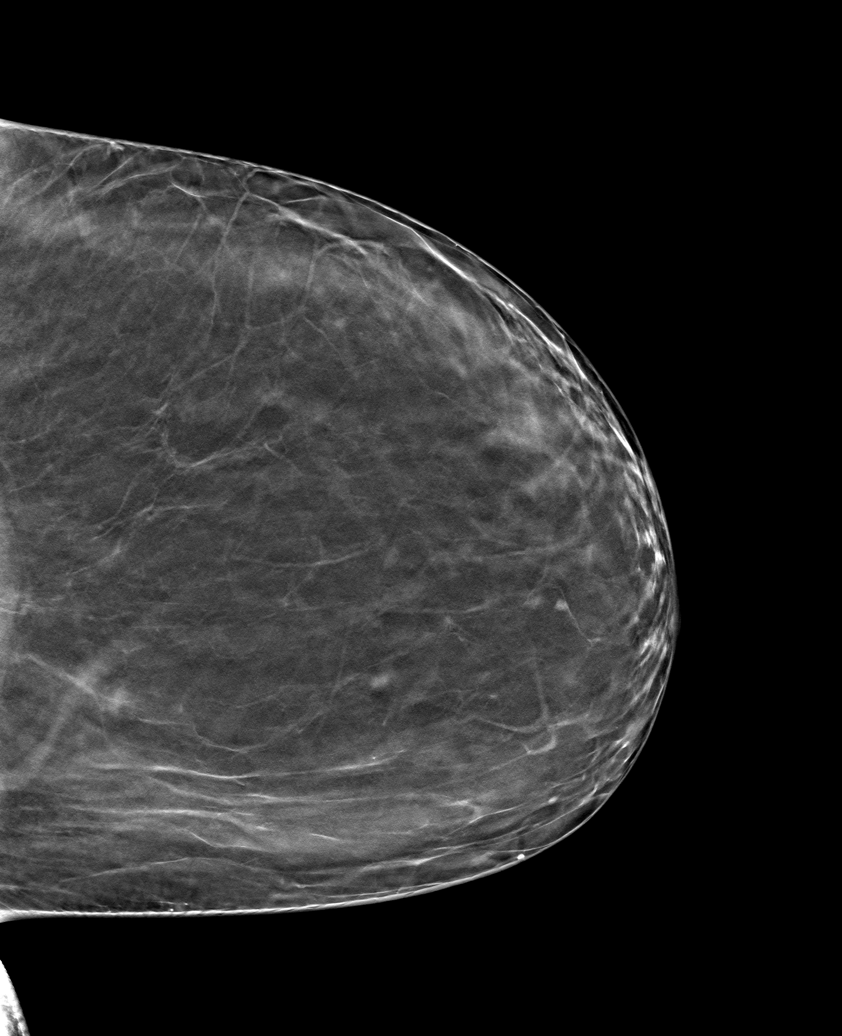

[6 of 14 positions shown; findings below may reference images not displayed]

ACR Breast Density Category b: There are scattered areas of
fibroglandular density.
FINDINGS: No suspicious masses, calcifications, distortion, or changes.

Mammographic images were processed with CAD.
IMPRESSION: No mammographic evidence of malignancy.

RECOMMENDATION:
Treatment of the patient's symptoms should be based on clinical and
physical exam given lack of imaging findings. Recommend annual
screening mammography in September 2017.

I have discussed the findings and recommendations with the patient.
Results were also provided in writing at the conclusion of the
visit. If applicable, a reminder letter will be sent to the patient
regarding the next appointment.

BI-RADS CATEGORY  2: Benign.

## 2017-07-05 DIAGNOSIS — M5416 Radiculopathy, lumbar region: Secondary | ICD-10-CM | POA: Diagnosis not present

## 2017-07-05 DIAGNOSIS — M4316 Spondylolisthesis, lumbar region: Secondary | ICD-10-CM | POA: Diagnosis not present

## 2017-07-18 ENCOUNTER — Encounter: Payer: Self-pay | Admitting: Internal Medicine

## 2017-07-18 ENCOUNTER — Ambulatory Visit (INDEPENDENT_AMBULATORY_CARE_PROVIDER_SITE_OTHER): Payer: BLUE CROSS/BLUE SHIELD | Admitting: Internal Medicine

## 2017-07-18 VITALS — BP 130/60 | HR 59 | Temp 98.2°F | Ht 65.0 in | Wt 160.4 lb

## 2017-07-18 DIAGNOSIS — R739 Hyperglycemia, unspecified: Secondary | ICD-10-CM

## 2017-07-18 DIAGNOSIS — Z23 Encounter for immunization: Secondary | ICD-10-CM

## 2017-07-18 DIAGNOSIS — Z0000 Encounter for general adult medical examination without abnormal findings: Secondary | ICD-10-CM | POA: Diagnosis not present

## 2017-07-18 LAB — CBC WITH DIFFERENTIAL/PLATELET
BASOS ABS: 0.1 10*3/uL (ref 0.0–0.1)
Basophils Relative: 0.7 % (ref 0.0–3.0)
Eosinophils Absolute: 0.1 10*3/uL (ref 0.0–0.7)
Eosinophils Relative: 1.8 % (ref 0.0–5.0)
HEMATOCRIT: 38.6 % (ref 36.0–46.0)
Hemoglobin: 13.2 g/dL (ref 12.0–15.0)
LYMPHS PCT: 31.4 % (ref 12.0–46.0)
Lymphs Abs: 2.2 10*3/uL (ref 0.7–4.0)
MCHC: 34.2 g/dL (ref 30.0–36.0)
MCV: 80.6 fl (ref 78.0–100.0)
MONOS PCT: 6.8 % (ref 3.0–12.0)
Monocytes Absolute: 0.5 10*3/uL (ref 0.1–1.0)
NEUTROS ABS: 4.2 10*3/uL (ref 1.4–7.7)
Neutrophils Relative %: 59.3 % (ref 43.0–77.0)
PLATELETS: 263 10*3/uL (ref 150.0–400.0)
RBC: 4.78 Mil/uL (ref 3.87–5.11)
RDW: 13.7 % (ref 11.5–15.5)
WBC: 7 10*3/uL (ref 4.0–10.5)

## 2017-07-18 LAB — COMPREHENSIVE METABOLIC PANEL
ALT: 14 U/L (ref 0–35)
AST: 14 U/L (ref 0–37)
Albumin: 4.1 g/dL (ref 3.5–5.2)
Alkaline Phosphatase: 53 U/L (ref 39–117)
BILIRUBIN TOTAL: 0.3 mg/dL (ref 0.2–1.2)
BUN: 14 mg/dL (ref 6–23)
CALCIUM: 10.3 mg/dL (ref 8.4–10.5)
CO2: 26 meq/L (ref 19–32)
Chloride: 103 mEq/L (ref 96–112)
Creatinine, Ser: 0.74 mg/dL (ref 0.40–1.20)
GFR: 102.87 mL/min (ref >60.00)
Glucose, Bld: 128 mg/dL — ABNORMAL HIGH (ref 70–99)
Potassium: 4.1 mEq/L (ref 3.5–5.1)
Sodium: 138 mEq/L (ref 135–145)
Total Protein: 7.2 g/dL (ref 6.0–8.3)

## 2017-07-18 LAB — LIPID PANEL
CHOL/HDL RATIO: 5
Cholesterol: 226 mg/dL — ABNORMAL HIGH (ref 0–200)
HDL: 46 mg/dL (ref >39.00)
LDL CALC: 154 mg/dL — AB (ref 0–99)
NonHDL: 179.69
TRIGLYCERIDES: 127 mg/dL (ref 0.0–149.0)
VLDL: 25.4 mg/dL (ref 0.0–40.0)

## 2017-07-18 LAB — TSH: TSH: 1.52 u[IU]/mL (ref 0.35–4.50)

## 2017-07-18 LAB — HEMOGLOBIN A1C: Hgb A1c MFr Bld: 6.1 % (ref 4.6–6.5)

## 2017-07-18 NOTE — Patient Instructions (Addendum)
Limit your sodium (Salt) intake  Please check your blood pressure on a regular basis.  If it is consistently greater than 150/90, please make an office appointment.    It is important that you exercise regularly, at least 20 minutes 3 to 4 times per week.  If you develop chest pain or shortness of breath seek  medical attention.  Return in 6 months for follow-up     Health Maintenance for Postmenopausal Women Menopause is a normal process in which your reproductive ability comes to an end. This process happens gradually over a span of months to years, usually between the ages of 48 and 55. Menopause is complete when you have missed 12 consecutive menstrual periods. It is important to talk with your health care provider about some of the most common conditions that affect postmenopausal women, such as heart disease, cancer, and bone loss (osteoporosis). Adopting a healthy lifestyle and getting preventive care can help to promote your health and wellness. Those actions can also lower your chances of developing some of these common conditions. What should I know about menopause? During menopause, you may experience a number of symptoms, such as:  Moderate-to-severe hot flashes.  Night sweats.  Decrease in sex drive.  Mood swings.  Headaches.  Tiredness.  Irritability.  Memory problems.  Insomnia.  Choosing to treat or not to treat menopausal changes is an individual decision that you make with your health care provider. What should I know about hormone replacement therapy and supplements? Hormone therapy products are effective for treating symptoms that are associated with menopause, such as hot flashes and night sweats. Hormone replacement carries certain risks, especially as you become older. If you are thinking about using estrogen or estrogen with progestin treatments, discuss the benefits and risks with your health care provider. What should I know about heart disease and  stroke? Heart disease, heart attack, and stroke become more likely as you age. This may be due, in part, to the hormonal changes that your body experiences during menopause. These can affect how your body processes dietary fats, triglycerides, and cholesterol. Heart attack and stroke are both medical emergencies. There are many things that you can do to help prevent heart disease and stroke:  Have your blood pressure checked at least every 1-2 years. High blood pressure causes heart disease and increases the risk of stroke.  If you are 55-79 years old, ask your health care provider if you should take aspirin to prevent a heart attack or a stroke.  Do not use any tobacco products, including cigarettes, chewing tobacco, or electronic cigarettes. If you need help quitting, ask your health care provider.  It is important to eat a healthy diet and maintain a healthy weight. ? Be sure to include plenty of vegetables, fruits, low-fat dairy products, and lean protein. ? Avoid eating foods that are high in solid fats, added sugars, or salt (sodium).  Get regular exercise. This is one of the most important things that you can do for your health. ? Try to exercise for at least 150 minutes each week. The type of exercise that you do should increase your heart rate and make you sweat. This is known as moderate-intensity exercise. ? Try to do strengthening exercises at least twice each week. Do these in addition to the moderate-intensity exercise.  Know your numbers.Ask your health care provider to check your cholesterol and your blood glucose. Continue to have your blood tested as directed by your health care provider.  What   know about cancer screening? There are several types of cancer. Take the following steps to reduce your risk and to catch any cancer development as early as possible. Breast Cancer  Practice breast self-awareness. ? This means understanding how your breasts normally appear  and feel. ? It also means doing regular breast self-exams. Let your health care provider know about any changes, no matter how small.  If you are 53 or older, have a clinician do a breast exam (clinical breast exam or CBE) every year. Depending on your age, family history, and medical history, it may be recommended that you also have a yearly breast X-ray (mammogram).  If you have a family history of breast cancer, talk with your health care provider about genetic screening.  If you are at high risk for breast cancer, talk with your health care provider about having an MRI and a mammogram every year.  Breast cancer (BRCA) gene test is recommended for women who have family members with BRCA-related cancers. Results of the assessment will determine the need for genetic counseling and BRCA1 and for BRCA2 testing. BRCA-related cancers include these types: ? Breast. This occurs in males or females. ? Ovarian. ? Tubal. This may also be called fallopian tube cancer. ? Cancer of the abdominal or pelvic lining (peritoneal cancer). ? Prostate. ? Pancreatic.  Cervical, Uterine, and Ovarian Cancer Your health care provider may recommend that you be screened regularly for cancer of the pelvic organs. These include your ovaries, uterus, and vagina. This screening involves a pelvic exam, which includes checking for microscopic changes to the surface of your cervix (Pap test).  For women ages 21-65, health care providers may recommend a pelvic exam and a Pap test every three years. For women ages 7-65, they may recommend the Pap test and pelvic exam, combined with testing for human papilloma virus (HPV), every five years. Some types of HPV increase your risk of cervical cancer. Testing for HPV may also be done on women of any age who have unclear Pap test results.  Other health care providers may not recommend any screening for nonpregnant women who are considered low risk for pelvic cancer and have no  symptoms. Ask your health care provider if a screening pelvic exam is right for you.  If you have had past treatment for cervical cancer or a condition that could lead to cancer, you need Pap tests and screening for cancer for at least 20 years after your treatment. If Pap tests have been discontinued for you, your risk factors (such as having a new sexual partner) need to be reassessed to determine if you should start having screenings again. Some women have medical problems that increase the chance of getting cervical cancer. In these cases, your health care provider may recommend that you have screening and Pap tests more often.  If you have a family history of uterine cancer or ovarian cancer, talk with your health care provider about genetic screening.  If you have vaginal bleeding after reaching menopause, tell your health care provider.  There are currently no reliable tests available to screen for ovarian cancer.  Lung Cancer Lung cancer screening is recommended for adults 53-71 years old who are at high risk for lung cancer because of a history of smoking. A yearly low-dose CT scan of the lungs is recommended if you:  Currently smoke.  Have a history of at least 30 pack-years of smoking and you currently smoke or have quit within the past 15 years. A  pack-year is smoking an average of one pack of cigarettes per day for one year.  Yearly screening should:  Continue until it has been 15 years since you quit.  Stop if you develop a health problem that would prevent you from having lung cancer treatment.  Colorectal Cancer  This type of cancer can be detected and can often be prevented.  Routine colorectal cancer screening usually begins at age 57 and continues through age 57.  If you have risk factors for colon cancer, your health care provider may recommend that you be screened at an earlier age.  If you have a family history of colorectal cancer, talk with your health care  provider about genetic screening.  Your health care provider may also recommend using home test kits to check for hidden blood in your stool.  A small camera at the end of a tube can be used to examine your colon directly (sigmoidoscopy or colonoscopy). This is done to check for the earliest forms of colorectal cancer.  Direct examination of the colon should be repeated every 5-10 years until age 24. However, if early forms of precancerous polyps or small growths are found or if you have a family history or genetic risk for colorectal cancer, you may need to be screened more often.  Skin Cancer  Check your skin from head to toe regularly.  Monitor any moles. Be sure to tell your health care provider: ? About any new moles or changes in moles, especially if there is a change in a mole's shape or color. ? If you have a mole that is larger than the size of a pencil eraser.  If any of your family members has a history of skin cancer, especially at a young age, talk with your health care provider about genetic screening.  Always use sunscreen. Apply sunscreen liberally and repeatedly throughout the day.  Whenever you are outside, protect yourself by wearing long sleeves, pants, a wide-brimmed hat, and sunglasses.  What should I know about osteoporosis? Osteoporosis is a condition in which bone destruction happens more quickly than new bone creation. After menopause, you may be at an increased risk for osteoporosis. To help prevent osteoporosis or the bone fractures that can happen because of osteoporosis, the following is recommended:  If you are 43-13 years old, get at least 1,000 mg of calcium and at least 600 mg of vitamin D per day.  If you are older than age 54 but younger than age 32, get at least 1,200 mg of calcium and at least 600 mg of vitamin D per day.  If you are older than age 84, get at least 1,200 mg of calcium and at least 800 mg of vitamin D per day.  Smoking and excessive  alcohol intake increase the risk of osteoporosis. Eat foods that are rich in calcium and vitamin D, and do weight-bearing exercises several times each week as directed by your health care provider. What should I know about how menopause affects my mental health? Depression may occur at any age, but it is more common as you become older. Common symptoms of depression include:  Low or sad mood.  Changes in sleep patterns.  Changes in appetite or eating patterns.  Feeling an overall lack of motivation or enjoyment of activities that you previously enjoyed.  Frequent crying spells.  Talk with your health care provider if you think that you are experiencing depression. What should I know about immunizations? It is important that you get  and maintain your immunizations. These include:  Tetanus, diphtheria, and pertussis (Tdap) booster vaccine.  Influenza every year before the flu season begins.  Pneumonia vaccine.  Shingles vaccine.  Your health care provider may also recommend other immunizations. This information is not intended to replace advice given to you by your health care provider. Make sure you discuss any questions you have with your health care provider. Document Released: 11/26/2005 Document Revised: 04/23/2016 Document Reviewed: 07/08/2015 Elsevier Interactive Patient Education  2018 [employer].

## 2017-07-18 NOTE — Addendum Note (Signed)
Addended by: Abelardo Diesel on: 07/18/2017 04:29 PM   Modules accepted: Orders

## 2017-07-18 NOTE — Progress Notes (Signed)
Subjective:    Patient ID: Rebecca Paul, female    DOB: Jun 12, 1956, 61 y.o.   MRN: [id number]  HPI  61 year old patient who is seen today for a preventive health examination She has had a mammogram earlier this year.  Colonoscopy 9 years ago.  She had a Pap smear  performed approximately 2 years ago. She has a history of essential hypertension, impaired glucose tolerance and dyslipidemia.  Past Medical History:  Diagnosis Date  . Headache(784.0)   . Hyperlipidemia   . Hypertension   . IGT (impaired glucose tolerance)   . Kidney stone      Social History   Social History  . Marital status: Married    Spouse name: N/A  . Number of children: N/A  . Years of education: N/A   Occupational History  . Not on file.   Social History Main Topics  . Smoking status: Never Smoker  . Smokeless tobacco: Never Used  . Alcohol use No  . Drug use: No  . Sexual activity: Not on file   Other Topics Concern  . Not on file   Social History Narrative  . No narrative on file    Past Surgical History:  Procedure Laterality Date  . ABDOMINAL HYSTERECTOMY    . TONSILLECTOMY AND ADENOIDECTOMY      Family History  Problem Relation Age of Onset  . Hypertension Mother   . Hyperlipidemia Mother   . Thyroid disease Mother   . Cancer Father        prostate  . Obesity Sister   . Diabetes Other   . Hyperlipidemia Other   . Hypertension Other   . Cancer Other        prostate  . Kidney disease Other   . Obesity Sister   . Heart attack Sister     No Known Allergies  Current Outpatient Prescriptions on File Prior to Visit  Medication Sig Dispense Refill  . atovaquone-proguanil (MALARONE) 250-100 MG TABS tablet Take 1 tablet daily starting 2 days prior to departure and continuing for 7 days following return from exposure. 16 tablet 1  . famciclovir (FAMVIR) 250 MG tablet Take 1 tablet (250 mg total) by mouth 2 (two) times daily as needed. 90 tablet 1  . fluticasone (FLONASE) 50  MCG/ACT nasal spray Place 2 sprays into both nostrils daily. 16 g 6  . losartan (COZAAR) 100 MG tablet Take 1 tablet (100 mg total) by mouth daily. 90 tablet 3   No current facility-administered medications on file prior to visit.     BP 130/60 (BP Location: Left Arm, Patient Position: Sitting, Cuff Size: Normal)   Pulse (!) 59   Temp 98.2 F (36.8 C) (Oral)   Ht 5\' 5"  (1.651 m)   Wt 160 lb 6.4 oz (72.8 kg)   SpO2 97%   BMI 26.69 kg/m      Review of Systems  Constitutional: Negative for appetite change, fatigue, fever and unexpected weight change.  HENT: Negative for congestion, dental problem, ear pain, hearing loss, mouth sores, nosebleeds, sinus pressure, sore throat, tinnitus, trouble swallowing and voice change.   Eyes: Negative for photophobia, pain, redness and visual disturbance.  Respiratory: Negative for cough, chest tightness and shortness of breath.   Cardiovascular: Negative for chest pain, palpitations and leg swelling.  Gastrointestinal: Negative for abdominal distention, abdominal pain, blood in stool, constipation, diarrhea, nausea, rectal pain and vomiting.  Genitourinary: Negative for difficulty urinating, dysuria, flank pain, frequency, genital sores, hematuria, menstrual problem,  pelvic pain, urgency, vaginal bleeding, vaginal discharge and vaginal pain.  Musculoskeletal: Negative for arthralgias, back pain and neck stiffness.  Skin: Negative for rash.  Neurological: Negative for dizziness, syncope, speech difficulty, weakness, light-headedness, numbness and headaches.  Hematological: Negative for adenopathy. Does not bruise/bleed easily.  Psychiatric/Behavioral: Negative for agitation, behavioral problems, dysphoric mood, self-injury and suicidal ideas. The patient is not nervous/anxious.        Objective:   Physical Exam  Constitutional: She is oriented to person, place, and time. She appears well-developed and well-nourished.  HENT:  Head: Normocephalic  and atraumatic.  Right Ear: External ear normal.  Left Ear: External ear normal.  Mouth/Throat: Oropharynx is clear and moist.  Eyes: Conjunctivae and EOM are normal.  Neck: Normal range of motion. Neck supple. No JVD present. No thyromegaly present.  Cardiovascular: Normal rate, regular rhythm, normal heart sounds and intact distal pulses.   No murmur heard. Pulmonary/Chest: Effort normal and breath sounds normal. She has no wheezes. She has no rales.  Abdominal: Soft. Bowel sounds are normal. She exhibits no distension and no mass. There is no tenderness. There is no rebound and no guarding.  Musculoskeletal: Normal range of motion. She exhibits no edema or tenderness.  Neurological: She is alert and oriented to person, place, and time. She has normal reflexes. No cranial nerve deficit. She exhibits normal muscle tone. Coordination normal.  Skin: Skin is warm and dry. No rash noted.  Psychiatric: She has a normal mood and affect. Her behavior is normal.          Assessment & Plan:   Preventive health examination Essential hypertension, well-controlled History mild dyslipidemia.  Will review a lipid profile Nephrolithiasis  Follow colonoscopy early next year. Annual gynecologic checks  Follow-up here 6 months or as needed  [name]

## 2017-07-19 LAB — HEPATITIS C ANTIBODY
Hepatitis C Ab: NONREACTIVE
SIGNAL TO CUT-OFF: 0.03 (ref <1.00)

## 2017-07-20 ENCOUNTER — Encounter: Payer: Self-pay | Admitting: Internal Medicine

## 2017-07-21 ENCOUNTER — Telehealth: Payer: Self-pay | Admitting: Internal Medicine

## 2017-07-21 NOTE — Telephone Encounter (Signed)
Pt calling stating that she would like to speak with someone concerning her lab results.  She was very nasty and wanted to know the protocol for getting results.

## 2017-07-21 NOTE — Telephone Encounter (Signed)
Spoke with pt and gave results and recommendations. Nothing further needed at this time.

## 2017-09-27 ENCOUNTER — Ambulatory Visit: Payer: Self-pay | Admitting: *Deleted

## 2017-09-27 NOTE — Telephone Encounter (Signed)
Pt complained of headaches when getting up ; pt was taking alieve; Losartan Potassium 100 mg daily; her last BP was 131/95 on left arm with an automatic cuff; pt states that her headache "comes and goes", and alieve helps; spt states that she has take her BP intermittentlyy and they range from 140-156/109-95pt offered and accepted at 1045 appointment with Dr Inda Merlin; pt acceptted and verbalize understanding; will route to [facility] pool for notification of this upcoming appointment      Reason for Disposition . Systolic BP  >= 481 OR Diastolic >= 856  Answer Assessment - Initial Assessment Questions 1. BLOOD PRESSURE: "What is the blood pressure?" "Did you take at least two measurements 5 minutes apart?"     131/95; yes; pt states that she did not write the other values down 2. ONSET: "When did you take your blood pressure?"     09/27/17 at 0800 after taking her BP med 3. HOW: "How did you obtain the blood pressure?" (e.g., visiting nurse, automatic home BP monitor)     Automatic home monitor 4. HISTORY: "Do you have a history of high blood pressure?"     yes 5. MEDICATIONS: "Are you taking any medications for blood pressure?" "Have you missed any doses recently?"     Losartan Potassium 100 mg; did miss the dose on Sunday [id number] 6. OTHER SYMPTOMS: "Do you have any symptoms?" (e.g., headache, chest pain, blurred vision, difficulty breathing, weakness)    Headache rated mild 1-3 out of 10; it is not consistent and goes away with alierve 7. PREGNANCY: "Is there any chance you are pregnant?" "When was your last menstrual period?"     no  Protocols used: HIGH BLOOD PRESSURE-A-AH

## 2017-09-28 ENCOUNTER — Encounter: Payer: Self-pay | Admitting: Internal Medicine

## 2017-09-28 ENCOUNTER — Ambulatory Visit: Payer: BLUE CROSS/BLUE SHIELD | Admitting: Internal Medicine

## 2017-09-28 VITALS — BP 138/78 | HR 67 | Temp 98.7°F | Ht 65.0 in | Wt 161.4 lb

## 2017-09-28 DIAGNOSIS — I10 Essential (primary) hypertension: Secondary | ICD-10-CM | POA: Diagnosis not present

## 2017-09-28 MED ORDER — LOSARTAN POTASSIUM-HCTZ 100-12.5 MG PO TABS: 1.0000 | ORAL_TABLET | Freq: Every day | ORAL | 2 refills | Status: DC

## 2017-09-28 NOTE — Progress Notes (Signed)
Subjective:    Patient ID: Rebecca Paul, female    DOB: 13-Jan-1956, 61 y.o.   MRN: [id number]  HPI  61 year old patient who is seen today for follow-up.  She has essential hypertension which has been controlled on losartan.  Over the past couple weeks she has had labile blood pressure readings often quite elevated.  She does have 2 home blood pressure monitors that have shown consistent readings.  She states that times diastolic blood pressure readings have been as high as 130.  She has some associated headaches.  She also describes some fatigue and occasional daytime sleepiness.  More recently blood pressure readings have been much improved.  Today she feels reasonably well  Past Medical History:  Diagnosis Date  . Headache(784.0)   . Hyperlipidemia   . Hypertension   . IGT (impaired glucose tolerance)   . Kidney stone      Social History   Socioeconomic History  . Marital status: Married    Spouse name: Not on file  . Number of children: Not on file  . Years of education: Not on file  . Highest education level: Not on file  Social Needs  . Financial resource strain: Not on file  . Food insecurity - worry: Not on file  . Food insecurity - inability: Not on file  . Transportation needs - medical: Not on file  . Transportation needs - non-medical: Not on file  Occupational History  . Not on file  Tobacco Use  . Smoking status: Never Smoker  . Smokeless tobacco: Never Used  Substance and Sexual Activity  . Alcohol use: No  . Drug use: No  . Sexual activity: Not on file  Other Topics Concern  . Not on file  Social History Narrative  . Not on file    Past Surgical History:  Procedure Laterality Date  . ABDOMINAL HYSTERECTOMY    . TONSILLECTOMY AND ADENOIDECTOMY      Family History  Problem Relation Age of Onset  . Hypertension Mother   . Hyperlipidemia Mother   . Thyroid disease Mother   . Cancer Father        prostate  . Obesity Sister   . Diabetes Other     . Hyperlipidemia Other   . Hypertension Other   . Cancer Other        prostate  . Kidney disease Other   . Obesity Sister   . Heart attack Sister     No Known Allergies  Current Outpatient Medications on File Prior to Visit  Medication Sig Dispense Refill  . atovaquone-proguanil (MALARONE) 250-100 MG TABS tablet Take 1 tablet daily starting 2 days prior to departure and continuing for 7 days following return from exposure. 16 tablet 1  . famciclovir (FAMVIR) 250 MG tablet Take 1 tablet (250 mg total) by mouth 2 (two) times daily as needed. 90 tablet 1  . fluticasone (FLONASE) 50 MCG/ACT nasal spray Place 2 sprays into both nostrils daily. 16 g 6   No current facility-administered medications on file prior to visit.     BP 138/78 (BP Location: Left Arm, Patient Position: Sitting, Cuff Size: Normal)   Pulse 67   Temp 98.7 F (37.1 C) (Oral)   Ht 5\' 5"  (1.651 m)   Wt 161 lb 6.4 oz (73.2 kg)   SpO2 96%   BMI 26.86 kg/m     Review of Systems  Constitutional: Positive for fatigue.  HENT: Negative for congestion, dental problem, hearing loss, rhinorrhea,  sinus pressure, sore throat and tinnitus.   Eyes: Negative for pain, discharge and visual disturbance.  Respiratory: Negative for cough and shortness of breath.   Cardiovascular: Negative for chest pain, palpitations and leg swelling.  Gastrointestinal: Negative for abdominal distention, abdominal pain, blood in stool, constipation, diarrhea, nausea and vomiting.  Genitourinary: Negative for difficulty urinating, dysuria, flank pain, frequency, hematuria, pelvic pain, urgency, vaginal bleeding, vaginal discharge and vaginal pain.  Musculoskeletal: Negative for arthralgias, gait problem and joint swelling.  Skin: Negative for rash.  Neurological: Positive for headaches. Negative for dizziness, syncope, speech difficulty, weakness and numbness.  Hematological: Negative for adenopathy.  Psychiatric/Behavioral: Negative for  agitation, behavioral problems and dysphoric mood. The patient is not nervous/anxious.        Objective:   Physical Exam  Constitutional: She is oriented to person, place, and time. She appears well-developed and well-nourished.  Blood pressure readings range from 120/70 to 140/80.  Very good correlation with both home blood pressure cuffs  HENT:  Head: Normocephalic.  Right Ear: External ear normal.  Left Ear: External ear normal.  Mouth/Throat: Oropharynx is clear and moist.  Eyes: Conjunctivae and EOM are normal. Pupils are equal, round, and reactive to light.  Neck: Normal range of motion. Neck supple. No thyromegaly present.  Cardiovascular: Normal rate, regular rhythm, normal heart sounds and intact distal pulses.  Pulmonary/Chest: Effort normal and breath sounds normal.  Abdominal: Soft. Bowel sounds are normal. She exhibits no mass. There is no tenderness.  Musculoskeletal: Normal range of motion.  Lymphadenopathy:    She has no cervical adenopathy.  Neurological: She is alert and oriented to person, place, and time.  Skin: Skin is warm and dry. No rash noted.  Psychiatric: She has a normal mood and affect. Her behavior is normal.          Assessment & Plan:   Labile hypertension.  We will continue close home blood pressure monitoring.  We will add hydrochlorothiazide 12.5 mg daily Low-salt diet recommended;  follow-up 1 month  [id number],PETER Pilar Plate

## 2017-09-28 NOTE — Patient Instructions (Signed)
Return in 4 weeks for follow-up  Limit your sodium (Salt) intake  Please check your blood pressure on a regular basis.  If it is consistently greater than 150/90, please make an office appointment.

## 2017-11-02 ENCOUNTER — Other Ambulatory Visit: Payer: Self-pay | Admitting: Internal Medicine

## 2017-11-21 ENCOUNTER — Encounter: Payer: Self-pay | Admitting: Gastroenterology

## 2017-11-28 DIAGNOSIS — H5213 Myopia, bilateral: Secondary | ICD-10-CM | POA: Diagnosis not present

## 2017-12-26 ENCOUNTER — Ambulatory Visit (INDEPENDENT_AMBULATORY_CARE_PROVIDER_SITE_OTHER): Payer: BLUE CROSS/BLUE SHIELD | Admitting: Internal Medicine

## 2017-12-26 ENCOUNTER — Encounter: Payer: Self-pay | Admitting: Internal Medicine

## 2017-12-26 VITALS — BP 120/70 | HR 68 | Temp 98.4°F | Wt 160.0 lb

## 2017-12-26 DIAGNOSIS — I10 Essential (primary) hypertension: Secondary | ICD-10-CM | POA: Diagnosis not present

## 2017-12-26 DIAGNOSIS — E739 Lactose intolerance, unspecified: Secondary | ICD-10-CM | POA: Diagnosis not present

## 2017-12-26 LAB — BASIC METABOLIC PANEL
BUN: 12 mg/dL (ref 6–23)
CHLORIDE: 102 meq/L (ref 96–112)
CO2: 31 meq/L (ref 19–32)
Calcium: 10.7 mg/dL — ABNORMAL HIGH (ref 8.4–10.5)
Creatinine, Ser: 0.79 mg/dL (ref 0.40–1.20)
GFR: 95.25 mL/min (ref >60.00)
Glucose, Bld: 99 mg/dL (ref 70–99)
Potassium: 3.9 mEq/L (ref 3.5–5.1)
Sodium: 137 mEq/L (ref 135–145)

## 2017-12-26 LAB — HEMOGLOBIN A1C: HEMOGLOBIN A1C: 6.2 % (ref 4.6–6.5)

## 2017-12-26 NOTE — Progress Notes (Signed)
Subjective:    Patient ID: Rebecca Paul, female    DOB: 01/31/1956, 62 y.o.   MRN: [id number]  HPI  Lab Results  Component Value Date   HGBA1C 6.2 12/26/2017    62 year old patient who has a history of essential hypertension as well as impaired glucose tolerance. Recently hydrochlorothiazide was added back to her regimen for additional blood pressure control.  She feels well today  BP Readings from Last 3 Encounters:  12/26/17 120/70  09/28/17 138/78  07/18/17 130/60    Past Medical History:  Diagnosis Date  . Headache(784.0)   . Hyperlipidemia   . Hypertension   . IGT (impaired glucose tolerance)   . Kidney stone      Social History   Socioeconomic History  . Marital status: Married    Spouse name: Not on file  . Number of children: Not on file  . Years of education: Not on file  . Highest education level: Not on file  Social Needs  . Financial resource strain: Not on file  . Food insecurity - worry: Not on file  . Food insecurity - inability: Not on file  . Transportation needs - medical: Not on file  . Transportation needs - non-medical: Not on file  Occupational History  . Not on file  Tobacco Use  . Smoking status: Never Smoker  . Smokeless tobacco: Never Used  Substance and Sexual Activity  . Alcohol use: No  . Drug use: No  . Sexual activity: Not on file  Other Topics Concern  . Not on file  Social History Narrative  . Not on file    Past Surgical History:  Procedure Laterality Date  . ABDOMINAL HYSTERECTOMY    . TONSILLECTOMY AND ADENOIDECTOMY      Family History  Problem Relation Age of Onset  . Cancer Father        prostate  . Hypertension Mother   . Hyperlipidemia Mother   . Thyroid disease Mother   . Obesity Sister   . Diabetes Other   . Hyperlipidemia Other   . Hypertension Other   . Cancer Other        prostate  . Kidney disease Other   . Obesity Sister   . Heart attack Sister     No Known Allergies  Current  Outpatient Medications on File Prior to Visit  Medication Sig Dispense Refill  . atovaquone-proguanil (MALARONE) 250-100 MG TABS tablet Take 1 tablet daily starting 2 days prior to departure and continuing for 7 days following return from exposure. 16 tablet 1  . famciclovir (FAMVIR) 250 MG tablet Take 1 tablet (250 mg total) by mouth 2 (two) times daily as needed. 90 tablet 1  . fluticasone (FLONASE) 50 MCG/ACT nasal spray Place 2 sprays into both nostrils daily. 16 g 6  . losartan-hydrochlorothiazide (HYZAAR) 100-12.5 MG tablet Take 1 tablet by mouth daily. 90 tablet 2   No current facility-administered medications on file prior to visit.     BP 120/70 (BP Location: Left Arm, Patient Position: Sitting)   Pulse 68   Temp 98.4 F (36.9 C) (Oral)   Wt 160 lb (72.6 kg)   SpO2 95%   BMI 26.63 kg/m      Review of Systems  Constitutional: Negative.   HENT: Negative for congestion, dental problem, hearing loss, rhinorrhea, sinus pressure, sore throat and tinnitus.   Eyes: Negative for pain, discharge and visual disturbance.  Respiratory: Negative for cough and shortness of breath.   Cardiovascular:  Negative for chest pain, palpitations and leg swelling.  Gastrointestinal: Negative for abdominal distention, abdominal pain, blood in stool, constipation, diarrhea, nausea and vomiting.  Genitourinary: Negative for difficulty urinating, dysuria, flank pain, frequency, hematuria, pelvic pain, urgency, vaginal bleeding, vaginal discharge and vaginal pain.  Musculoskeletal: Negative for arthralgias, gait problem and joint swelling.  Skin: Negative for rash.  Neurological: Negative for dizziness, syncope, speech difficulty, weakness, numbness and headaches.  Hematological: Negative for adenopathy.  Psychiatric/Behavioral: Negative for agitation, behavioral problems and dysphoric mood. The patient is not nervous/anxious.        Objective:   Physical Exam  Constitutional: She is oriented to  person, place, and time. She appears well-developed and well-nourished.  Blood pressure 116/72  HENT:  Head: Normocephalic.  Right Ear: External ear normal.  Left Ear: External ear normal.  Mouth/Throat: Oropharynx is clear and moist.  Eyes: Conjunctivae and EOM are normal. Pupils are equal, round, and reactive to light.  Neck: Normal range of motion. Neck supple. No thyromegaly present.  Cardiovascular: Normal rate, regular rhythm, normal heart sounds and intact distal pulses.  Pulmonary/Chest: Effort normal and breath sounds normal.  Abdominal: Soft. Bowel sounds are normal. She exhibits no mass. There is no tenderness.  Musculoskeletal: Normal range of motion.  Lymphadenopathy:    She has no cervical adenopathy.  Neurological: She is alert and oriented to person, place, and time.  Skin: Skin is warm and dry. No rash noted.  Psychiatric: She has a normal mood and affect. Her behavior is normal.          Assessment & Plan:   Hypertension stable.  Will check electrolytes in view of recent addition of diuretic therapy. Impaired glucose tolerance.  Will follow up in globin A1c Recheck 6 months or as needed   [employer]

## 2017-12-26 NOTE — Patient Instructions (Signed)
Limit your sodium (Salt) intake  Please check your blood pressure on a regular basis.  If it is consistently greater than 150/90, please make an office appointment.   Please check your hemoglobin A1c every 6 months    It is important that you exercise regularly, at least 20 minutes 3 to 4 times per week.  If you develop chest pain or shortness of breath seek  medical attention.

## 2017-12-30 ENCOUNTER — Telehealth: Payer: Self-pay

## 2017-12-30 NOTE — Telephone Encounter (Signed)
Spoke to patient and she is highly upset with the all center issue and wishes to speak to me directly. I spoke to patient and told her the process and she replied that she understood but still seem to be upset. She was calling for results from her lab. I informed her that Dr.Kwiatkowski was out of the office until Monday and I will call her as soon as he reads them and give me the information. She stated that she should get a call no later than Tuesday. I informed her that he will get to them as soon as possible. Patient was also upset and stated that an A1c was done in December but wasn't documented. I informed her that she recently had a CPE so an A1c probably wasn't necessary. She stated that she knew it was done and said " people lives shouldn't be played with. I apologize for any inconveniences and she said okay. Will update patient when result come in.

## 2017-12-30 NOTE — Progress Notes (Signed)
Open patient chart to see what was going on with patient information. No further action needed

## 2018-01-09 ENCOUNTER — Other Ambulatory Visit: Payer: Self-pay

## 2018-01-09 ENCOUNTER — Ambulatory Visit (AMBULATORY_SURGERY_CENTER): Payer: Self-pay | Admitting: *Deleted

## 2018-01-09 VITALS — Ht 61.0 in | Wt 160.0 lb

## 2018-01-09 DIAGNOSIS — Z1211 Encounter for screening for malignant neoplasm of colon: Secondary | ICD-10-CM

## 2018-01-09 NOTE — Progress Notes (Signed)
No egg or soy allergy known to patient  No issues with past sedation with any surgeries  or procedures, no intubation problems  No diet pills per patient No home 02 use per patient  No blood thinners per patient  Pt denies issues with constipation  No A fib or A flutter  EMMI video sent to pt's e mail pt declined   

## 2018-01-10 ENCOUNTER — Encounter: Payer: Self-pay | Admitting: Gastroenterology

## 2018-01-13 ENCOUNTER — Encounter: Payer: BLUE CROSS/BLUE SHIELD | Admitting: Gastroenterology

## 2018-01-16 HISTORY — PX: COLONOSCOPY: SHX174

## 2018-01-23 ENCOUNTER — Ambulatory Visit (AMBULATORY_SURGERY_CENTER): Payer: BLUE CROSS/BLUE SHIELD | Admitting: Gastroenterology

## 2018-01-23 ENCOUNTER — Other Ambulatory Visit: Payer: Self-pay

## 2018-01-23 ENCOUNTER — Encounter: Payer: Self-pay | Admitting: Gastroenterology

## 2018-01-23 VITALS — BP 116/70 | HR 53 | Temp 98.4°F | Resp 20 | Ht 61.0 in | Wt 160.0 lb

## 2018-01-23 DIAGNOSIS — Z1211 Encounter for screening for malignant neoplasm of colon: Secondary | ICD-10-CM

## 2018-01-23 DIAGNOSIS — Z1212 Encounter for screening for malignant neoplasm of rectum: Secondary | ICD-10-CM

## 2018-01-23 MED ORDER — SODIUM CHLORIDE 0.9 % IV SOLN: 500.0000 mL | Freq: Once | INTRAVENOUS | Status: DC

## 2018-01-23 NOTE — Progress Notes (Signed)
To recovery, report to RN, VSS. 

## 2018-01-23 NOTE — Progress Notes (Signed)
Pt questioned why she was given such a lengthy prep.  She states that she doesn't have constipation and never had a poor prep for previous colonoscopies.  Advised pt that those were the instructions given to her by the pre-visit nurse.  Pt still unsure of indication for long prep and voiced concern over it.

## 2018-01-23 NOTE — Op Note (Signed)
[facility] Patient Name: Rebecca Paul Procedure Date: 01/23/2018 11:40 AM MRN: [id number] Endoscopist: Mauri Pole , MD Age: 62 Referring MD:  Date of Birth: Sep 06, 1956 Gender: Female Account #: [id number] Procedure:                Colonoscopy Indications:              Screening for colorectal malignant neoplasm, Last                            colonoscopy: 2009 Medicines:                Monitored Anesthesia Care Procedure:                Pre-Anesthesia Assessment:                           - Prior to the procedure, a History and Physical                            was performed, and patient medications and                            allergies were reviewed. The patient's tolerance of                            previous anesthesia was also reviewed. The risks                            and benefits of the procedure and the sedation                            options and risks were discussed with the patient.                            All questions were answered, and informed consent                            was obtained. Prior Anticoagulants: The patient has                            taken no previous anticoagulant or antiplatelet                            agents. ASA Grade Assessment: II - A patient with                            mild systemic disease. After reviewing the risks                            and benefits, the patient was deemed in                            satisfactory condition to undergo the procedure.  After obtaining informed consent, the colonoscope                            was passed under direct vision. Throughout the                            procedure, the patient's blood pressure, pulse, and                            oxygen saturations were monitored continuously. The                            Model PCF-H190DL [id number]) scope was introduced                            through the anus and advanced to the  the cecum,                            identified by appendiceal orifice and ileocecal                            valve. The colonoscopy was performed without                            difficulty. The patient tolerated the procedure                            well. The quality of the bowel preparation was                            adequate. The ileocecal valve, appendiceal orifice,                            and rectum were photographed. Scope In: 11:48:12 AM Scope Out: 12:05:45 PM Scope Withdrawal Time: 0 hours 11 minutes 3 seconds  Total Procedure Duration: 0 hours 17 minutes 33 seconds  Findings:                 The perianal and digital rectal examinations were                            normal.                           The ileocecal valve was mildly lipomatous.                           Non-bleeding internal hemorrhoids were found during                            retroflexion. The hemorrhoids were small.                           The exam was otherwise without abnormality. Complications:            No immediate complications. Estimated Blood  Loss:     Estimated blood loss: none. Impression:               - Lipomatous ileocecal valve.                           - Non-bleeding internal hemorrhoids.                           - The examination was otherwise normal.                           - No specimens collected. Recommendation:           - Patient has a contact number available for                            emergencies. The signs and symptoms of potential                            delayed complications were discussed with the                            patient. Return to normal activities tomorrow.                            Written discharge instructions were provided to the                            patient.                           - Resume previous diet.                           - Continue present medications.                           - Repeat colonoscopy in 10 years  for screening                            purposes. Mauri Pole, MD 01/23/2018 12:10:14 PM This report has been signed electronically.

## 2018-01-23 NOTE — Progress Notes (Signed)
Pt's states no medical or surgical changes since previsit or office visit.  Pt states she took one dulcolax pill for 5 days prior to starting prep on 01/22/2018.  Also took one bottle of Magnesium Citrate in the morning 01/22/2018, then started the split dose of Miralax and gatorade .

## 2018-01-23 NOTE — Patient Instructions (Signed)
YOU HAD AN ENDOSCOPIC PROCEDURE TODAY AT THE [facility]:   Refer to the procedure report that was given to you for any specific questions about what was found during the examination.  If the procedure report does not answer your questions, please call your gastroenterologist to clarify.  If you requested that your care partner not be given the details of your procedure findings, then the procedure report has been included in a sealed envelope for you to review at your convenience later.  YOU SHOULD EXPECT: Some feelings of bloating in the abdomen. Passage of more gas than usual.  Walking can help get rid of the air that was put into your GI tract during the procedure and reduce the bloating. If you had a lower endoscopy (such as a colonoscopy or flexible sigmoidoscopy) you may notice spotting of blood in your stool or on the toilet paper. If you underwent a bowel prep for your procedure, you may not have a normal bowel movement for a few days.  Please Note:  You might notice some irritation and congestion in your nose or some drainage.  This is from the oxygen used during your procedure.  There is no need for concern and it should clear up in a day or so.  SYMPTOMS TO REPORT IMMEDIATELY:   Following lower endoscopy (colonoscopy or flexible sigmoidoscopy):  Excessive amounts of blood in the stool  Significant tenderness or worsening of abdominal pains  Swelling of the abdomen that is new, acute  Fever of 100F or higher   For urgent or emergent issues, a gastroenterologist can be reached at any hour by calling [phone].   DIET:  We do recommend a small meal at first, but then you may proceed to your regular diet.  Drink plenty of fluids but you should avoid alcoholic beverages for 24 hours.  ACTIVITY:  You should plan to take it easy for the rest of today and you should NOT DRIVE or use heavy machinery until tomorrow (because of the sedation medicines used during the test).     FOLLOW UP: Our staff will call the number listed on your records the next business day following your procedure to check on you and address any questions or concerns that you may have regarding the information given to you following your procedure. If we do not reach you, we will leave a message.  However, if you are feeling well and you are not experiencing any problems, there is no need to return our call.  We will assume that you have returned to your regular daily activities without incident.  If any biopsies were taken you will be contacted by phone or by letter within the next 1-3 weeks.  Please call us at [phone] if you have not heard about the biopsies in 3 weeks.    SIGNATURES/CONFIDENTIALITY: You and/or your care partner have signed paperwork which will be entered into your electronic medical record.  These signatures attest to the fact that that the information above on your After Visit Summary has been reviewed and is understood.  Full responsibility of the confidentiality of this discharge information lies with you and/or your care-partner.  Read all handouts given to you  By your recovery room nurse. 

## 2018-01-24 ENCOUNTER — Telehealth: Payer: Self-pay | Admitting: *Deleted

## 2018-01-24 NOTE — Telephone Encounter (Signed)
  Follow up Call-  Call back number 01/23/2018  Post procedure Call Back phone  # [phone]  Permission to leave phone message Yes  Some recent data might be hidden     Patient questions:  Do you have a fever, pain , or abdominal swelling? No. Pain Score  0 *  Have you tolerated food without any problems? Yes.    Have you been able to return to your normal activities? Yes.    Do you have any questions about your discharge instructions: Diet   No. Medications  No. Follow up visit  No.  Do you have questions or concerns about your Care? No.  Actions: * If pain score is 4 or above: No action needed, pain <4.

## 2018-02-14 DIAGNOSIS — M545 Low back pain: Secondary | ICD-10-CM | POA: Diagnosis not present

## 2018-02-14 DIAGNOSIS — M5136 Other intervertebral disc degeneration, lumbar region: Secondary | ICD-10-CM | POA: Diagnosis not present

## 2018-02-14 DIAGNOSIS — M4316 Spondylolisthesis, lumbar region: Secondary | ICD-10-CM | POA: Diagnosis not present

## 2018-04-05 ENCOUNTER — Telehealth: Payer: Self-pay | Admitting: *Deleted

## 2018-04-05 NOTE — Telephone Encounter (Signed)
Called pt to schedule an OV to see if a referral needs to be placed. Pt did not answer and does not have a vm.

## 2018-04-05 NOTE — Telephone Encounter (Signed)
Copied from [place] [id number]. Topic: Referral - Request >> Apr 05, 2018  8:46 AM Nils Flack wrote: Reason for CRM: pt would like referral to ent.  Pt is coughing up phlegm, and has drainage.  Cb is [phone]   >> Apr 05, 2018  9:02 AM Virl Cagey, CMA wrote: Pt having a lot of increased cough; persistent Mucous production, yellow in color, feels more so from PND and not from lungs.  Pt having still having a little blood streaking with mucous production. Pt notes having nasal congestion and will see Najwa Spillane mucous at times.  Denies fever, CP  Pt requesting a referral straight to ENT  For her symptoms

## 2018-04-05 NOTE — Telephone Encounter (Signed)
Copied from [place] [id number]. Topic: Appointment Scheduling - Scheduling Inquiry for Clinic >> Apr 05, 2018  8:50 AM Nils Flack wrote: Reason for CRM: pt want s to be put on list for shingles shot. Please call [phone]

## 2018-04-06 NOTE — Telephone Encounter (Signed)
Patient placed on wait list

## 2018-04-17 ENCOUNTER — Encounter: Payer: Self-pay | Admitting: Internal Medicine

## 2018-04-17 ENCOUNTER — Ambulatory Visit: Payer: BLUE CROSS/BLUE SHIELD | Admitting: Internal Medicine

## 2018-04-17 VITALS — BP 118/70 | HR 70 | Temp 98.2°F | Wt 162.0 lb

## 2018-04-17 DIAGNOSIS — I10 Essential (primary) hypertension: Secondary | ICD-10-CM

## 2018-04-17 DIAGNOSIS — Z1231 Encounter for screening mammogram for malignant neoplasm of breast: Secondary | ICD-10-CM | POA: Diagnosis not present

## 2018-04-17 DIAGNOSIS — E739 Lactose intolerance, unspecified: Secondary | ICD-10-CM

## 2018-04-17 DIAGNOSIS — Z13820 Encounter for screening for osteoporosis: Secondary | ICD-10-CM | POA: Diagnosis not present

## 2018-04-17 DIAGNOSIS — Z01419 Encounter for gynecological examination (general) (routine) without abnormal findings: Secondary | ICD-10-CM | POA: Diagnosis not present

## 2018-04-17 DIAGNOSIS — Z6830 Body mass index (BMI) 30.0-30.9, adult: Secondary | ICD-10-CM | POA: Diagnosis not present

## 2018-04-17 LAB — POCT GLYCOSYLATED HEMOGLOBIN (HGB A1C): Hemoglobin A1C: 6.4 % — AB (ref 4.0–5.6)

## 2018-04-17 NOTE — Progress Notes (Signed)
Subjective:    Patient ID: Rebecca Paul, female    DOB: 1956-05-11, 62 y.o.   MRN: [id number]  HPI  62 year old patient who has essential hypertension.  There was a mixup with her medication and apparently she was not taking blood pressure medications for several days however she resumed about 1 week ago. Complaints today include some sinus congestion and rhinorrhea she has some occasional cough  Past Medical History:  Diagnosis Date  . Allergy    more sinus issues   . Anemia    yrs past as child , with pregnancy   . Arthritis    shoulders - not diagnosed   . Headache(784.0)   . Hyperlipidemia   . Hypertension   . IGT (impaired glucose tolerance)   . Kidney stone   . Osteopenia   . Plantar fasciitis      Social History   Socioeconomic History  . Marital status: Married    Spouse name: Not on file  . Number of children: Not on file  . Years of education: Not on file  . Highest education level: Not on file  Occupational History  . Not on file  Social Needs  . Financial resource strain: Not on file  . Food insecurity:    Worry: Not on file    Inability: Not on file  . Transportation needs:    Medical: Not on file    Non-medical: Not on file  Tobacco Use  . Smoking status: Never Smoker  . Smokeless tobacco: Never Used  Substance and Sexual Activity  . Alcohol use: Yes    Comment: rare glass of wine   . Drug use: No  . Sexual activity: Not on file  Lifestyle  . Physical activity:    Days per week: Not on file    Minutes per session: Not on file  . Stress: Not on file  Relationships  . Social connections:    Talks on phone: Not on file    Gets together: Not on file    Attends religious service: Not on file    Active member of club or organization: Not on file    Attends meetings of clubs or organizations: Not on file    Relationship status: Not on file  . Intimate partner violence:    Fear of current or ex partner: Not on file    Emotionally abused:  Not on file    Physically abused: Not on file    Forced sexual activity: Not on file  Other Topics Concern  . Not on file  Social History Narrative  . Not on file    Past Surgical History:  Procedure Laterality Date  . ABDOMINAL HYSTERECTOMY    . COLONOSCOPY     10 yr ago- [facility] - normal   . TONSILLECTOMY AND ADENOIDECTOMY      Family History  Problem Relation Age of Onset  . Cancer Father        prostate  . Hypertension Mother   . Hyperlipidemia Mother   . Thyroid disease Mother   . Obesity Sister   . Diabetes Other   . Hyperlipidemia Other   . Hypertension Other   . Cancer Other        prostate  . Kidney disease Other   . Obesity Sister   . Heart attack Sister   . Colon cancer Neg Hx   . Colon polyps Neg Hx   . Rectal cancer Neg Hx   . Stomach cancer Neg  Hx     No Known Allergies  Current Outpatient Medications on File Prior to Visit  Medication Sig Dispense Refill  . cholecalciferol (VITAMIN D) 1000 units tablet Take 1,000 Units by mouth daily.    . famciclovir (FAMVIR) 250 MG tablet Take 1 tablet (250 mg total) by mouth 2 (two) times daily as needed. 90 tablet 1  . Fish Oil-Cholecalciferol (OMEGA-3 + D PO) Take by mouth daily.    . fluticasone (FLONASE) 50 MCG/ACT nasal spray Place 2 sprays into both nostrils daily. 16 g 6  . losartan-hydrochlorothiazide (HYZAAR) 100-12.5 MG tablet Take 1 tablet by mouth daily. 90 tablet 2  . vitamin E 100 UNIT capsule Take by mouth daily.     No current facility-administered medications on file prior to visit.     BP 118/70 (BP Location: Right Arm, Patient Position: Sitting, Cuff Size: Large)   Pulse 70   Temp 98.2 F (36.8 C) (Oral)   Wt 162 lb (73.5 kg)   SpO2 94%   BMI 30.61 kg/m     Review of Systems  Constitutional: Negative.   HENT: Positive for congestion, postnasal drip, rhinorrhea and sinus pressure. Negative for dental problem, hearing loss, sore throat and tinnitus.   Eyes: Negative for pain,  discharge and visual disturbance.  Respiratory: Positive for cough. Negative for shortness of breath.   Cardiovascular: Negative for chest pain, palpitations and leg swelling.  Gastrointestinal: Negative for abdominal distention, abdominal pain, blood in stool, constipation, diarrhea, nausea and vomiting.  Genitourinary: Negative for difficulty urinating, dysuria, flank pain, frequency, hematuria, pelvic pain, urgency, vaginal bleeding, vaginal discharge and vaginal pain.  Musculoskeletal: Negative for arthralgias, gait problem and joint swelling.  Skin: Negative for rash.  Neurological: Negative for dizziness, syncope, speech difficulty, weakness, numbness and headaches.  Hematological: Negative for adenopathy.  Psychiatric/Behavioral: Negative for agitation, behavioral problems and dysphoric mood. The patient is not nervous/anxious.        Objective:   Physical Exam  Constitutional: She is oriented to person, place, and time. She appears well-developed and well-nourished.  Blood pressure 140/80  HENT:  Head: Normocephalic.  Right Ear: External ear normal.  Left Ear: External ear normal.  Mouth/Throat: Oropharynx is clear and moist.  Eyes: Pupils are equal, round, and reactive to light. Conjunctivae and EOM are normal.  Neck: Normal range of motion. Neck supple. No thyromegaly present.  Cardiovascular: Normal rate, regular rhythm, normal heart sounds and intact distal pulses.  Pulmonary/Chest: Effort normal and breath sounds normal.  Abdominal: Soft. Bowel sounds are normal. She exhibits no mass. There is no tenderness.  Musculoskeletal: Normal range of motion.  Lymphadenopathy:    She has no cervical adenopathy.  Neurological: She is alert and oriented to person, place, and time.  Skin: Skin is warm and dry. No rash noted.  Psychiatric: She has a normal mood and affect. Her behavior is normal.          Assessment & Plan:   Essential hypertension.  No change in therapy    allergic rhinitis We will continue fluticasone and take on a regular basis.  Add Zyrtec  Follow-up 4 to 6 months Continue home blood pressure monitoring  Marletta Lor

## 2018-04-17 NOTE — Patient Instructions (Addendum)
Limit your sodium (Salt) intake  Please check your blood pressure on a regular basis.  If it is consistently greater than 150/90, please make an office appointment.  Hydrate and Humidify  Drink enough water to keep your urine clear or pale yellow. Staying hydrated will help to thin your mucus.  Use a cool mist humidifier to keep the humidity level in your home above 50%.  Inhale steam for 10-15 minutes, 3-4 times a day or as told by your health care provider. You can do this in the bathroom while a hot shower is running.  Limit your exposure to cool or dry air.   Return for follow-up in 4 to 6 months with a new provider

## 2018-04-18 ENCOUNTER — Encounter: Payer: Self-pay | Admitting: Internal Medicine

## 2018-05-01 ENCOUNTER — Ambulatory Visit (INDEPENDENT_AMBULATORY_CARE_PROVIDER_SITE_OTHER): Payer: BLUE CROSS/BLUE SHIELD | Admitting: *Deleted

## 2018-05-01 DIAGNOSIS — Z23 Encounter for immunization: Secondary | ICD-10-CM | POA: Diagnosis not present

## 2018-05-01 NOTE — Progress Notes (Signed)
Pt given Shingrix vaccine today, per orders of Dr Burnice Logan. Vaccine drawn up and given by Rebecca Eaton, CMA. Shot administration observed by Varney Daily, CMA.

## 2018-05-29 DIAGNOSIS — R1032 Left lower quadrant pain: Secondary | ICD-10-CM | POA: Diagnosis not present

## 2018-07-03 ENCOUNTER — Ambulatory Visit (INDEPENDENT_AMBULATORY_CARE_PROVIDER_SITE_OTHER): Payer: BLUE CROSS/BLUE SHIELD | Admitting: *Deleted

## 2018-07-03 DIAGNOSIS — Z23 Encounter for immunization: Secondary | ICD-10-CM

## 2018-07-03 NOTE — Progress Notes (Signed)
Per orders of Dr. Raliegh Ip, injection of Shingrix #2 given by Virl Cagey. Patient tolerated injection well.

## 2018-07-05 ENCOUNTER — Other Ambulatory Visit: Payer: Self-pay | Admitting: Internal Medicine

## 2018-07-22 ENCOUNTER — Other Ambulatory Visit: Payer: Self-pay | Admitting: Internal Medicine

## 2018-08-14 DIAGNOSIS — M25561 Pain in right knee: Secondary | ICD-10-CM | POA: Diagnosis not present

## 2018-08-14 DIAGNOSIS — M1711 Unilateral primary osteoarthritis, right knee: Secondary | ICD-10-CM | POA: Diagnosis not present

## 2018-08-31 NOTE — Progress Notes (Signed)
Established Patient Office Visit     CC/Reason for Visit: follow up HTN and medication refill  HPI: Rebecca Paul is a 62 y.o. female who is coming in today for above mentioned reasons. Past Medical History is significant for: HTN, HLD and IGT.  She is only interested in having her blood pressure medication refilled today and states that she will make an appointment ASAP for her physical and labs for which she is due.  She has no acute complaints this visit.   Health Maintenance: Will continue to address at next physical. -PAP: 7/19 (normal). Due 7/22 -Mammo: 7/19 (normal). Due 7/20 -Colon Cancer: 4/19 (normal). Due 4/29 -Lung Ca (ages 81-80 with 33 pack-yr history or quit past 15 yrs): not smoker -DEXA: age 62 -Vaccines: Tdap (due 8/24), PNA (due age 48), RZV (7/19 and 9/19) -HIV: check today -HIV PrEP if high risk: -BP: Has HTN on treatment -Depression: -DM: -Hep C (adults born between 1945-1965): Negative 10/18 -Tobacco cessation: not a smoker -Healthy Diet and Exercise: Discussed mediterranean diet and at least 30 minutes of aerobic exercise daily.  Past Medical/Surgical History: Past Medical History:  Diagnosis Date  . Allergy    more sinus issues   . Anemia    yrs past as child , with pregnancy   . Arthritis    shoulders - not diagnosed   . Headache(784.0)   . Hyperlipidemia   . Hypertension   . IGT (impaired glucose tolerance)   . Kidney stone   . Osteopenia   . Plantar fasciitis     Past Surgical History:  Procedure Laterality Date  . ABDOMINAL HYSTERECTOMY    . COLONOSCOPY     10 yr ago- [facility] - normal   . TONSILLECTOMY AND ADENOIDECTOMY      Social History:  reports that she has never smoked. She has never used smokeless tobacco. She reports that she drinks alcohol. She reports that she does not use drugs.  Allergies: No Known Allergies  Family History:  Family History  Problem Relation Age of Onset  . Cancer Father        prostate    . Hypertension Mother   . Hyperlipidemia Mother   . Thyroid disease Mother   . Obesity Sister   . Diabetes Other   . Hyperlipidemia Other   . Hypertension Other   . Cancer Other        prostate  . Kidney disease Other   . Obesity Sister   . Heart attack Sister   . Colon cancer Neg Hx   . Colon polyps Neg Hx   . Rectal cancer Neg Hx   . Stomach cancer Neg Hx      Current Outpatient Medications:  .  cholecalciferol (VITAMIN D) 1000 units tablet, Take 1,000 Units by mouth daily., Disp: , Rfl:  .  famciclovir (FAMVIR) 250 MG tablet, Take 1 tablet (250 mg total) by mouth 2 (two) times daily as needed., Disp: 90 tablet, Rfl: 1 .  Fish Oil-Cholecalciferol (OMEGA-3 + D PO), Take by mouth daily., Disp: , Rfl:  .  fluticasone (FLONASE) 50 MCG/ACT nasal spray, Place 2 sprays into both nostrils daily., Disp: 16 g, Rfl: 6 .  losartan-hydrochlorothiazide (HYZAAR) 100-12.5 MG tablet, TAKE 1 TABLET BY MOUTH EVERY DAY, Disp: 90 tablet, Rfl: 0 .  vitamin E 100 UNIT capsule, Take by mouth daily., Disp: , Rfl:   Review of Systems:  Constitutional: Denies fever, chills, diaphoresis, appetite change and fatigue.  Respiratory: Denies  SOB, DOE, cough, chest tightness,  and wheezing.   Cardiovascular: Denies chest pain, palpitations and leg swelling.   All other systems are reviewed and negative.   Physical Exam: Vitals:   09/01/18 1646  BP: 122/78  Pulse: (!) 52  Temp: 98.4 F (36.9 C)  TempSrc: Oral  SpO2: 96%  Weight: 161 lb (73 kg)    Body mass index is 30.42 kg/m.   General exam: Alert, awake, oriented x 3 Respiratory system: Clear to auscultation. Respiratory effort normal. Cardiovascular system:RRR. No murmurs, rubs, gallops. Central nervous system: Alert and oriented. No focal neurological deficits. Psychiatry: Judgement and insight appear normal. Mood & affect appropriate.     Impression and Plan:  Dyslipidemia -Not on meds. -Due for FLP at next visit.  Essential  hypertension -Blood pressure is well controlled today. -On Losartan/HCTZ 100-25 mg.  Will send refill today.  IMPAIRED GLUCOSE TOLERANCE -A1C in 7/19 was 6.4. Prior A1Cs 6.2 and 6.1. -Recheck A1C and basic labs at next visit during physical. -We will discuss initiation of metformin at next visit, especially given her BMI greater than 30, even if A1c remains in the impaired glucose tolerance range. -Discussed lifestyle modifications briefly, will continue to address at next visit.  -Return to clinic as soon as possible for annual physical. -Will receive flu shot today.   A1C,CBC,BMET, FLP,HIV will be due at next visit.  Patient Instructions  BEFORE YOU LEAVE: -Flu shot  -Follow up: At your earliest convenience to schedule your physical, but before 4 months have passed. -We will refill your medications today.         Lelon Frohlich, MD [facility]

## 2018-08-31 NOTE — Patient Instructions (Addendum)
BEFORE YOU LEAVE: -Flu shot  -Follow up: At your earliest convenience to schedule your physical, but before 4 months have passed. -We will refill your medications today.

## 2018-09-01 ENCOUNTER — Encounter: Payer: Self-pay | Admitting: Internal Medicine

## 2018-09-01 ENCOUNTER — Ambulatory Visit: Payer: BLUE CROSS/BLUE SHIELD | Admitting: Internal Medicine

## 2018-09-01 VITALS — BP 122/78 | HR 52 | Temp 98.4°F | Wt 161.0 lb

## 2018-09-01 DIAGNOSIS — I10 Essential (primary) hypertension: Secondary | ICD-10-CM | POA: Diagnosis not present

## 2018-09-01 DIAGNOSIS — R7309 Other abnormal glucose: Secondary | ICD-10-CM | POA: Diagnosis not present

## 2018-09-01 DIAGNOSIS — Z23 Encounter for immunization: Secondary | ICD-10-CM | POA: Diagnosis not present

## 2018-09-01 MED ORDER — LOSARTAN POTASSIUM-HCTZ 100-12.5 MG PO TABS: 1.0000 | ORAL_TABLET | Freq: Every day | ORAL | 0 refills | Status: DC

## 2018-09-01 NOTE — Addendum Note (Signed)
Addended by: Westley Hummer B on: 09/01/2018 05:24 PM   Modules accepted: Orders

## 2018-09-18 ENCOUNTER — Ambulatory Visit (INDEPENDENT_AMBULATORY_CARE_PROVIDER_SITE_OTHER): Payer: BLUE CROSS/BLUE SHIELD | Admitting: Family Medicine

## 2018-09-18 ENCOUNTER — Encounter: Payer: Self-pay | Admitting: Family Medicine

## 2018-09-18 VITALS — BP 120/78 | HR 62 | Temp 98.8°F | Ht 61.75 in | Wt 164.2 lb

## 2018-09-18 DIAGNOSIS — Z0000 Encounter for general adult medical examination without abnormal findings: Secondary | ICD-10-CM | POA: Diagnosis not present

## 2018-09-18 DIAGNOSIS — E785 Hyperlipidemia, unspecified: Secondary | ICD-10-CM

## 2018-09-18 DIAGNOSIS — I10 Essential (primary) hypertension: Secondary | ICD-10-CM

## 2018-09-18 DIAGNOSIS — E739 Lactose intolerance, unspecified: Secondary | ICD-10-CM

## 2018-09-18 DIAGNOSIS — D229 Melanocytic nevi, unspecified: Secondary | ICD-10-CM

## 2018-09-18 LAB — CBC WITH DIFFERENTIAL/PLATELET
Basophils Absolute: 0 10*3/uL (ref 0.0–0.1)
Basophils Relative: 0.6 % (ref 0.0–3.0)
Eosinophils Absolute: 0.2 10*3/uL (ref 0.0–0.7)
Eosinophils Relative: 2.4 % (ref 0.0–5.0)
HCT: 39.1 % (ref 36.0–46.0)
Hemoglobin: 13.5 g/dL (ref 12.0–15.0)
LYMPHS PCT: 35.1 % (ref 12.0–46.0)
Lymphs Abs: 2.3 10*3/uL (ref 0.7–4.0)
MCHC: 34.6 g/dL (ref 30.0–36.0)
MCV: 80.3 fl (ref 78.0–100.0)
Monocytes Absolute: 0.4 10*3/uL (ref 0.1–1.0)
Monocytes Relative: 6.7 % (ref 3.0–12.0)
Neutro Abs: 3.7 10*3/uL (ref 1.4–7.7)
Neutrophils Relative %: 55.2 % (ref 43.0–77.0)
Platelets: 284 10*3/uL (ref 150.0–400.0)
RBC: 4.86 Mil/uL (ref 3.87–5.11)
RDW: 14.1 % (ref 11.5–15.5)
WBC: 6.6 10*3/uL (ref 4.0–10.5)

## 2018-09-18 LAB — COMPREHENSIVE METABOLIC PANEL
ALT: 10 U/L (ref 0–35)
AST: 12 U/L (ref 0–37)
Albumin: 4.4 g/dL (ref 3.5–5.2)
Alkaline Phosphatase: 44 U/L (ref 39–117)
BUN: 12 mg/dL (ref 6–23)
CO2: 26 mEq/L (ref 19–32)
CREATININE: 0.76 mg/dL (ref 0.40–1.20)
Calcium: 10.4 mg/dL (ref 8.4–10.5)
Chloride: 105 mEq/L (ref 96–112)
GFR: 99.36 mL/min (ref >60.00)
Glucose, Bld: 117 mg/dL — ABNORMAL HIGH (ref 70–99)
Potassium: 4.2 mEq/L (ref 3.5–5.1)
Sodium: 138 mEq/L (ref 135–145)
TOTAL PROTEIN: 7.5 g/dL (ref 6.0–8.3)
Total Bilirubin: 0.4 mg/dL (ref 0.2–1.2)

## 2018-09-18 LAB — LIPID PANEL
CHOLESTEROL: 235 mg/dL — AB (ref 0–200)
HDL: 49 mg/dL (ref >39.00)
LDL CALC: 164 mg/dL — AB (ref 0–99)
NonHDL: 185.5
Total CHOL/HDL Ratio: 5
Triglycerides: 110 mg/dL (ref 0.0–149.0)
VLDL: 22 mg/dL (ref 0.0–40.0)

## 2018-09-18 LAB — HEMOGLOBIN A1C: Hgb A1c MFr Bld: 6.4 % (ref 4.6–6.5)

## 2018-09-18 LAB — TSH: TSH: 1.02 u[IU]/mL (ref 0.35–4.50)

## 2018-09-18 NOTE — Addendum Note (Signed)
Addended by: Caffie Pinto on: 09/18/2018 11:30 AM   Modules accepted: Orders

## 2018-09-18 NOTE — Progress Notes (Signed)
Rebecca Paul DOB: March 18, 1956 Encounter date: 09/18/2018  This is a 62 y.o. female who presents for complete physical   History of present illness/Additional concerns: Sees [facility]yn [facility] Jinny Blossom)): last mammogram 04/2018, DEXA 04/2018. Had some tenderness left breast. Had evaluation with US/mammo. Also had some tenderness of abd - had US ovary but this was fine. Had spec of blood in recent couple years but also discussed this with them.   Needed provider who could see her on Mondays so has switched to me today. Transferring from Dr. Raliegh Ip. Takes health seriously; just wants to make sure she is staying healthy and doing everything that she can to stay healthy.   Has some back problems - sees Dr. Maxie Better. Did xrays, doc wanted her to get MRI but back stopped hurting. But back flared up this weekend. Knows she flared it preparing for Thanksgiving. Decided to go ahead and get MRI completed. Also left knee bothered her a couple of years ago; had cortisone shot. Fell on right knee 2-3 months ago. Some swelling. Went to ortho, little sore in knee and had imaging. Decided not to get cortisone shot but was given prednisone pack for 6 days. Was holding onto this. Didn't want to start it because she read that it could affect bloodwork.   Does worry about DM due to family hx. Started screenings at work about 5 years ago and initial A1C was elevated in 8 range; when it was rechecked was normal. Hasn't been as good at eating healthy in recent months. Has been checking here every 3 months to make sure staying in normal range. Does exercise, but hasn't been as on top of this. Usually goes to gym 2-3 times/week. Does treadmill, but thinking about kickboxing.   Had last colonosocpy 01/2018  Past Medical History:  Diagnosis Date  . Allergy    more sinus issues   . Anemia    yrs past as child , with pregnancy   . Arthritis    shoulders - not diagnosed   . Headache(784.0)   . Hyperlipidemia   .  Hypertension   . IGT (impaired glucose tolerance)   . Kidney stone   . Osteopenia   . Plantar fasciitis    Past Surgical History:  Procedure Laterality Date  . ABDOMINAL HYSTERECTOMY     fibroids  . CESAREAN SECTION    . COLONOSCOPY  01/2018   normal  . OOPHORECTOMY     dermoid tumor  . TONSILLECTOMY AND ADENOIDECTOMY     No Known Allergies Current Meds  Medication Sig  . cholecalciferol (VITAMIN D) 1000 units tablet Take 1,000 Units by mouth daily.  . famciclovir (FAMVIR) 250 MG tablet Take 1 tablet (250 mg total) by mouth 2 (two) times daily as needed.  . Fish Oil-Cholecalciferol (OMEGA-3 + D PO) Take by mouth daily.  . fluticasone (FLONASE) 50 MCG/ACT nasal spray Place 2 sprays into both nostrils daily.  Marland Kitchen losartan-hydrochlorothiazide (HYZAAR) 100-12.5 MG tablet Take 1 tablet by mouth daily.  . vitamin E 100 UNIT capsule Take by mouth daily.   Social History   Tobacco Use  . Smoking status: Never Smoker  . Smokeless tobacco: Never Used  Substance Use Topics  . Alcohol use: Yes    Comment: rare glass of wine    Family History  Problem Relation Age of Onset  . Cancer Father        prostate  . Hypertension Mother   . Hyperlipidemia Mother   . Thyroid  disease Mother   . Diabetes Mother   . Obesity Sister   . Diabetes Sister   . Diabetes Other   . Hyperlipidemia Other   . Hypertension Other   . Cancer Other        prostate  . Kidney disease Other   . Obesity Sister   . Heart attack Sister   . Diabetes Maternal Grandmother   . Colon cancer Neg Hx   . Colon polyps Neg Hx   . Rectal cancer Neg Hx   . Stomach cancer Neg Hx      Review of Systems  Constitutional: Negative for activity change, appetite change, chills, fatigue, fever and unexpected weight change.  HENT: Negative for congestion, ear pain, hearing loss, sinus pressure, sinus pain, sore throat and trouble swallowing.   Eyes: Negative for pain and visual disturbance.  Respiratory: Negative for  cough, chest tightness, shortness of breath and wheezing.   Cardiovascular: Negative for chest pain, palpitations and leg swelling.  Gastrointestinal: Negative for abdominal pain, blood in stool, constipation, diarrhea, nausea and vomiting.  Genitourinary: Negative for difficulty urinating and menstrual problem.  Musculoskeletal: Positive for arthralgias (see hpi) and back pain.  Skin: Negative for rash.  Neurological: Negative for dizziness, weakness, numbness and headaches.  Hematological: Negative for adenopathy. Does not bruise/bleed easily.  Psychiatric/Behavioral: Negative for sleep disturbance and suicidal ideas. The patient is not nervous/anxious.     CBC:  Lab Results  Component Value Date   WBC 7.0 07/18/2017   HGB 13.2 07/18/2017   HCT 38.6 07/18/2017   MCHC 34.2 07/18/2017   RDW 13.7 07/18/2017   PLT 263.0 07/18/2017   CMP: Lab Results  Component Value Date   NA 137 12/26/2017   K 3.9 12/26/2017   CL 102 12/26/2017   CO2 31 12/26/2017   GLUCOSE 99 12/26/2017   GLUCOSE 102 (H) 09/26/2006   BUN 12 12/26/2017   CREATININE 0.79 12/26/2017   GFRAA 98 09/28/2007   CALCIUM 10.7 (H) 12/26/2017   PROT 7.2 07/18/2017   BILITOT 0.3 07/18/2017   ALKPHOS 53 07/18/2017   ALT 14 07/18/2017   AST 14 07/18/2017   LIPID: Lab Results  Component Value Date   CHOL 226 (H) 07/18/2017   TRIG 127.0 07/18/2017   TRIG 95 09/26/2006   HDL 46.00 07/18/2017   LDLCALC 154 (H) 07/18/2017    Objective:  BP 120/78 (BP Location: Left Arm, Patient Position: Sitting, Cuff Size: Large)   Pulse 62   Temp 98.8 F (37.1 C) (Oral)   Ht 5' 1.75" (1.568 m)   Wt 164 lb 3.2 oz (74.5 kg)   SpO2 96%   BMI 30.28 kg/m   Weight: 164 lb 3.2 oz (74.5 kg)   BP Readings from Last 3 Encounters:  09/18/18 120/78  09/01/18 122/78  04/17/18 118/70   Wt Readings from Last 3 Encounters:  09/18/18 164 lb 3.2 oz (74.5 kg)  09/01/18 161 lb (73 kg)  04/17/18 162 lb (73.5 kg)    Physical Exam   Constitutional: She is oriented to person, place, and time. She appears well-developed and well-nourished. No distress.  HENT:  Head: Normocephalic and atraumatic.  Right Ear: External ear normal.  Left Ear: External ear normal.  Mouth/Throat: Oropharynx is clear and moist. No oropharyngeal exudate.  Eyes: Pupils are equal, round, and reactive to light. Conjunctivae are normal.  Neck: Normal range of motion. Neck supple. No thyromegaly present.  Cardiovascular: Normal rate, regular rhythm and normal heart sounds. Exam reveals no  gallop and no friction rub.  No murmur heard. Pulmonary/Chest: Effort normal and breath sounds normal.  Abdominal: Soft. Bowel sounds are normal. She exhibits no distension and no mass. There is no tenderness. There is no guarding. No hernia.  Musculoskeletal: Normal range of motion. She exhibits no edema, tenderness or deformity.  Lymphadenopathy:    She has no cervical adenopathy.  Neurological: She is alert and oriented to person, place, and time. She has normal strength. She displays normal reflexes.  Reflex Scores:      Tricep reflexes are 2+ on the right side and 2+ on the left side.      Bicep reflexes are 2+ on the right side and 2+ on the left side.      Brachioradialis reflexes are 2+ on the right side and 2+ on the left side.      Patellar reflexes are 2+ on the right side and 2+ on the left side. Skin: Skin is warm and dry. No rash noted.  Psychiatric: She has a normal mood and affect. Her speech is normal and behavior is normal. Thought content normal.    Assessment/Plan: There are no preventive care reminders to display for this patient.   Health Maintenance reviewed and up to date. 1. Well adult exam Work on healthier eating and exercise regularly. Work on smaller healthy meals through day.  2. Essential hypertension Stable. Continue current medication.  - Comprehensive metabolic panel; Future - CBC with Differential/Platelet; Future  3.  IMPAIRED GLUCOSE TOLERANCE - Hemoglobin A1c; Future  4. Dyslipidemia - TSH; Future - Lipid panel; Future - Comprehensive metabolic panel; Future  5. Skin mole - Ambulatory referral to Dermatology  Return in about 6 months (around 03/20/2019) for Chronic condition visit.  Micheline Rough, MD

## 2018-09-22 ENCOUNTER — Telehealth: Payer: Self-pay | Admitting: Family Medicine

## 2018-09-22 DIAGNOSIS — D229 Melanocytic nevi, unspecified: Secondary | ICD-10-CM

## 2018-09-22 DIAGNOSIS — R0981 Nasal congestion: Secondary | ICD-10-CM

## 2018-09-22 DIAGNOSIS — R7303 Prediabetes: Secondary | ICD-10-CM

## 2018-09-22 NOTE — Telephone Encounter (Signed)
Copied from [place] [id number]. Topic: Quick Communication - Lab Results (Clinic Use ONLY) >> Sep 22, 2018 11:47 AM Rebecca Eaton, CMA wrote: Called patient to inform them of 09/18/18 lab results. When patient returns call, triage nurse may disclose results. >> Sep 22, 2018 11:50 AM Bea Graff, NT wrote: Pt calling back to get lab results.

## 2018-09-22 NOTE — Telephone Encounter (Signed)
Charted in result notes. 

## 2018-09-22 NOTE — Telephone Encounter (Signed)
-----   Message from Matilde Sprang, RN sent at 09/22/2018  3:12 PM EST ----- Pt given lab results per notes of Dr. Ethlyn Gallery on 09/21/18. Pt verbalized understanding. She says she is not taking any statin medications. She says she will work on lifestyle changes and weight loss to lower the cholesterol, LDL. She wants to know if her A1C will be checked in 3 months along with the cholesterol. She says she would like a call back to let her know and a message can be left on her phone, then she will schedule the lab check.

## 2018-10-19 ENCOUNTER — Telehealth: Payer: Self-pay

## 2018-10-19 NOTE — Telephone Encounter (Signed)
Are you able to release results? I doc not have clearance.

## 2018-10-19 NOTE — Telephone Encounter (Signed)
Copied from [place] [id number]. Topic: General - Other >> Oct 19, 2018 11:58 AM Percell Belt A wrote: Reason for CRM: pt does not have mychart and would like a copy of her most recent blood work mailed to her. She wants a copy of every single lab that was ran on 09/18/2018

## 2018-10-19 NOTE — Telephone Encounter (Signed)
Copy mailed to home address on file, per patient request.

## 2018-10-30 NOTE — Telephone Encounter (Signed)
Patient said she is ready to start on the Cholesterol Medication and would like to speak with the nurse.

## 2018-10-31 MED ORDER — PRAVASTATIN SODIUM 20 MG PO TABS: 20.0000 mg | ORAL_TABLET | Freq: Every day | ORAL | 1 refills | Status: DC

## 2018-10-31 NOTE — Telephone Encounter (Signed)
Please read through message train - looks like 1 month ago message was sent but patient didn't get response to that?  *Yes, A1C can be rechecked in march. OK to order. *If she wants to start pravastatin 20mg  qhs you can send in rx. This is typically the best tolerated of cholesterol medications, but if any muscle cramping let us know. We can recheck lipid profile/CMP with that A1C in 3 months time.

## 2018-10-31 NOTE — Telephone Encounter (Signed)
A1C orderd. Rx has been sent  Patient is aware. Patient states she discussed sinus issues with Dr. Ethlyn Gallery at her last OV. She would like a referral to ENT.  Please advise.

## 2018-11-03 NOTE — Telephone Encounter (Signed)
[place] for referral. Just document in message current symptoms.

## 2018-11-03 NOTE — Telephone Encounter (Signed)
Left message for patient to call back. CRM created 

## 2018-11-03 NOTE — Telephone Encounter (Signed)
Actually sounds more acute; see if she would like visit (acute) for assessment? Can you sinus pressure, but it can take some time to get in with ENT. It would make sense for one of [place] to see her first for this. Also, with hx of allergies it may make more sense to see allergist if she hasn't already.

## 2018-11-03 NOTE — Telephone Encounter (Signed)
Spoke with patient she stated she currently has a productive cough, runny nose, the right side of her nose is stopped up, and drainage.   Please advise, what Dx would you like to use for the referral?

## 2018-11-07 NOTE — Telephone Encounter (Signed)
Left message for patient to call back  

## 2018-11-10 NOTE — Telephone Encounter (Signed)
Left message for patient to call back. Unable to reach patient. Message will be closed.

## 2018-11-13 DIAGNOSIS — H5213 Myopia, bilateral: Secondary | ICD-10-CM | POA: Diagnosis not present

## 2018-12-26 NOTE — Telephone Encounter (Signed)
Pt called back.  She would like to speak with someone about this referral.

## 2018-12-26 NOTE — Telephone Encounter (Signed)
Spoke with patient. She would like referrals placed for dermatology and ENT.

## 2019-01-22 ENCOUNTER — Other Ambulatory Visit: Payer: Self-pay

## 2019-01-22 MED ORDER — FLUTICASONE PROPIONATE 50 MCG/ACT NA SUSP: 2.0000 | Freq: Every day | NASAL | 6 refills | Status: DC

## 2019-01-24 ENCOUNTER — Other Ambulatory Visit: Payer: Self-pay | Admitting: Family Medicine

## 2019-01-24 NOTE — Telephone Encounter (Signed)
Requested medication (s) are due for refill today: yes  Requested medication (s) are on the active medication list:yes  Last refill: 11/08/16 Historical provider  Future visit scheduled: no Notes to clinic: Historical provider , expired RX    Requested Prescriptions  Pending Prescriptions Disp Refills   famciclovir (FAMVIR) 250 MG tablet 90 tablet 1    Sig: Take 1 tablet (250 mg total) by mouth 2 (two) times daily as needed.     Antimicrobials:  Antiviral Agents - Anti-Herpetic Passed - 01/24/2019  2:55 PM      Passed - Valid encounter within last 12 months    Recent Outpatient Visits          4 months ago Well adult exam   [facility], Steele Berg, MD   4 months ago Essential hypertension   Therap[facility], [facility] at [facility], Rayford Halsted, MD   9 months ago [place] [facility] [place], Doretha Sou, MD   1 year ago [place] [facility] [place], Doretha Sou, MD   1 year ago Essential hypertension   Therapist, [facility] at [facility], Doretha Sou, MD

## 2019-03-02 ENCOUNTER — Telehealth: Payer: Self-pay | Admitting: Family Medicine

## 2019-03-02 NOTE — Telephone Encounter (Signed)
Copied from [place] [id number]. Topic: Quick Communication - Rx Refill/Question >> Mar 02, 2019  4:17 PM Celene Kras A wrote: Medication: pravastatin (PRAVACHOL) 20 MG tablet  Has the patient contacted their pharmacy? Yes.   (Agent: If no, request that the patient contact the pharmacy for the refill.) (Agent: If yes, when and what did the pharmacy advise?)  Preferred Pharmacy (with phone number or street name): [id number] - [place], [place] [address] [place] [address] Phone: [phone] Fax: [phone] Not a 24 hour pharmacy; exact hours not known.    Agent: Please be advised that RX refills may take up to 3 business days. We ask that you follow-up with your pharmacy.

## 2019-03-02 NOTE — Telephone Encounter (Signed)
Pt called and stated that she does not need the RX below filled. Pt states that she needs losartan-hydrochlorothiazide (HYZAAR) 100-12.5 MG tablet [[id number]]   Please advise

## 2019-03-05 MED ORDER — LOSARTAN POTASSIUM-HCTZ 100-12.5 MG PO TABS: 1.0000 | ORAL_TABLET | Freq: Every day | ORAL | 1 refills | Status: DC

## 2019-03-05 NOTE — Telephone Encounter (Signed)
Rx done.  I called the pt and left a detailed message with the information below.

## 2019-03-05 NOTE — Telephone Encounter (Signed)
[place] for 90 day supply w 1 refill. I haven't seen this request to my knowledge come through previously.

## 2019-03-05 NOTE — Telephone Encounter (Signed)
Patient calling to check the status of her medication refill for losartan-hydrochlorothiazide (HYZAAR) 100-12.5 MG tablet. Advised of refill policy. Patient states that she only has a couple pills left and would like to get this refilled asap. Please advise.

## 2019-03-19 ENCOUNTER — Telehealth: Payer: Self-pay | Admitting: Family Medicine

## 2019-03-19 NOTE — Telephone Encounter (Signed)
Pt would like come in to have her A1 and Cholesterol checked pt did not want to do a virtual appointment to speak with the provider.  Pt is also wanting to have a COVID test done but she is not having any symptoms at this present time she stated that she just wanted to know.  May I please have orders for labs?

## 2019-03-20 ENCOUNTER — Other Ambulatory Visit: Payer: Self-pay | Admitting: Family Medicine

## 2019-03-20 DIAGNOSIS — E739 Lactose intolerance, unspecified: Secondary | ICD-10-CM

## 2019-03-20 DIAGNOSIS — E785 Hyperlipidemia, unspecified: Secondary | ICD-10-CM

## 2019-03-20 DIAGNOSIS — I10 Essential (primary) hypertension: Secondary | ICD-10-CM

## 2019-03-20 NOTE — Telephone Encounter (Signed)
I have ordered bloodwork; she was due for CCV in June anyway; why don't we have her do bloodwork and then schedule virtual CCV for followup so we can review bloodwork together at that time.

## 2019-03-21 NOTE — Telephone Encounter (Signed)
I called the pt and scheduled lab and follow up appts as below.

## 2019-04-02 ENCOUNTER — Other Ambulatory Visit: Payer: Self-pay

## 2019-04-02 ENCOUNTER — Other Ambulatory Visit (INDEPENDENT_AMBULATORY_CARE_PROVIDER_SITE_OTHER): Payer: BC Managed Care – PPO

## 2019-04-02 DIAGNOSIS — E739 Lactose intolerance, unspecified: Secondary | ICD-10-CM | POA: Diagnosis not present

## 2019-04-02 DIAGNOSIS — E785 Hyperlipidemia, unspecified: Secondary | ICD-10-CM

## 2019-04-02 DIAGNOSIS — I10 Essential (primary) hypertension: Secondary | ICD-10-CM

## 2019-04-02 LAB — COMPREHENSIVE METABOLIC PANEL
ALT: 11 U/L (ref 0–35)
AST: 12 U/L (ref 0–37)
Albumin: 4.2 g/dL (ref 3.5–5.2)
Alkaline Phosphatase: 48 U/L (ref 39–117)
BUN: 16 mg/dL (ref 6–23)
CO2: 27 mEq/L (ref 19–32)
Calcium: 10.6 mg/dL — ABNORMAL HIGH (ref 8.4–10.5)
Chloride: 101 mEq/L (ref 96–112)
Creatinine, Ser: 0.77 mg/dL (ref 0.40–1.20)
GFR: 91.92 mL/min (ref >60.00)
Glucose, Bld: 199 mg/dL — ABNORMAL HIGH (ref 70–99)
Potassium: 4.3 mEq/L (ref 3.5–5.1)
Sodium: 136 mEq/L (ref 135–145)
Total Bilirubin: 0.6 mg/dL (ref 0.2–1.2)
Total Protein: 7.3 g/dL (ref 6.0–8.3)

## 2019-04-02 LAB — CBC WITH DIFFERENTIAL/PLATELET
Basophils Absolute: 0 10*3/uL (ref 0.0–0.1)
Basophils Relative: 0.4 % (ref 0.0–3.0)
Eosinophils Absolute: 0.1 10*3/uL (ref 0.0–0.7)
Eosinophils Relative: 2 % (ref 0.0–5.0)
HCT: 39.7 % (ref 36.0–46.0)
Hemoglobin: 13.7 g/dL (ref 12.0–15.0)
Lymphocytes Relative: 39.2 % (ref 12.0–46.0)
Lymphs Abs: 2.9 10*3/uL (ref 0.7–4.0)
MCHC: 34.5 g/dL (ref 30.0–36.0)
MCV: 80.9 fl (ref 78.0–100.0)
Monocytes Absolute: 0.5 10*3/uL (ref 0.1–1.0)
Monocytes Relative: 7.5 % (ref 3.0–12.0)
Neutro Abs: 3.7 10*3/uL (ref 1.4–7.7)
Neutrophils Relative %: 50.9 % (ref 43.0–77.0)
Platelets: 273 10*3/uL (ref 150.0–400.0)
RBC: 4.91 Mil/uL (ref 3.87–5.11)
RDW: 13.7 % (ref 11.5–15.5)
WBC: 7.3 10*3/uL (ref 4.0–10.5)

## 2019-04-02 LAB — HEMOGLOBIN A1C: Hgb A1c MFr Bld: 8.1 % — ABNORMAL HIGH (ref 4.6–6.5)

## 2019-04-02 LAB — LIPID PANEL
Cholesterol: 191 mg/dL (ref 0–200)
HDL: 46.1 mg/dL (ref >39.00)
LDL Cholesterol: 118 mg/dL — ABNORMAL HIGH (ref 0–99)
NonHDL: 144.72
Total CHOL/HDL Ratio: 4
Triglycerides: 132 mg/dL (ref 0.0–149.0)
VLDL: 26.4 mg/dL (ref 0.0–40.0)

## 2019-04-09 ENCOUNTER — Other Ambulatory Visit: Payer: Self-pay

## 2019-04-09 ENCOUNTER — Ambulatory Visit (INDEPENDENT_AMBULATORY_CARE_PROVIDER_SITE_OTHER): Payer: BC Managed Care – PPO | Admitting: Family Medicine

## 2019-04-09 ENCOUNTER — Encounter: Payer: Self-pay | Admitting: Family Medicine

## 2019-04-09 DIAGNOSIS — E1165 Type 2 diabetes mellitus with hyperglycemia: Secondary | ICD-10-CM | POA: Diagnosis not present

## 2019-04-09 DIAGNOSIS — I10 Essential (primary) hypertension: Secondary | ICD-10-CM

## 2019-04-09 DIAGNOSIS — E785 Hyperlipidemia, unspecified: Secondary | ICD-10-CM | POA: Diagnosis not present

## 2019-04-09 DIAGNOSIS — E119 Type 2 diabetes mellitus without complications: Secondary | ICD-10-CM | POA: Insufficient documentation

## 2019-04-09 MED ORDER — BLOOD GLUCOSE MONITOR KIT: PACK | 0 refills | Status: DC

## 2019-04-09 MED ORDER — PRAVASTATIN SODIUM 20 MG PO TABS: 30.0000 mg | ORAL_TABLET | Freq: Every day | ORAL | 1 refills | Status: DC

## 2019-04-09 NOTE — Progress Notes (Signed)
Virtual Visit via Telephone Note  I connected with Rebecca Paul  on 04/09/19 at 12:30 PM EDT by telephone and verified that I am speaking with the correct person using two identifiers.   I discussed the limitations, risks, security and privacy concerns of performing an evaluation and management service by telephone and the availability of in person appointments. I also discussed with the patient that there may be a patient responsible charge related to this service. The patient expressed understanding and agreed to proceed.  Location patient: home Location provider: work office Participants present for the call: patient, provider Patient did not have a visit in the prior 7 days to address this/these issue(s).   History of Present Illness: Worried about increase in HbA1C from 6 range to 8.1. Has been at home working, so thinks a lot of this is related to diet/exercise. Has been drinking sodas daily. Eating has been off the chart. Has not felt poorly, no fatigue, no "warning signs" of sugar elevating -no thirst, urination increase. Has had headaches - but thought that was more related to sinus issues. Really does not want to be on medication for diabetes. Was just getting high numbers with work screening and then when she would get follow up sugars checked at lab they would be ok.   BP today was 125/82 and has been running well at home (usually less than this). HR 72.    Has had some anxiety with COVID - just seemed to affect eating and sleep.   Temp today was 98.7.   She thought the pravastatin may be causing some muscle cramping.  It was not something coming on a daily basis, so she was not sure if it was related.  She did hold medication and thought maybe there was improvement, but has since restarted.  She did have an episode of calf cramping yesterday.  We did review cholesterol numbers and she has had a significant improvement in cholesterol since starting the  pravastatin.   Observations/Objective: Patient sounds cheerful and well on the phone. I do not appreciate any SOB. Speech and thought processing are grossly intact. Patient reported vitals:see above  Assessment and Plan: 1. Essential hypertension Blood pressure has been well controlled.  She does limit salt intake.  She does take medication regularly.  2. Uncontrolled type 2 diabetes mellitus with hyperglycemia (HCC) On further discussion of blood sugar today, she did have history of elevated A1c, but this was done through a fingerstick at work and was never verified here with blood work in the office (whenever she rechecked here in the office she was not diabetic).  Current A1c of 8.1 is the highest patient is ever seen.  We discussed diet in detail today.  She has not been compliant and has been eating significantly excess amounts of carbohydrates, sugar, soda.  She has not been physically active.  We discussed in detail how carbohydrates and lack of exercise elevate blood sugars.  We discussed when and how to check blood sugars.  Recommended checking fasting sugars in the morning and a 2-hour postprandial during the afternoon or evening.  We will do a follow-up virtual visit in 1 month to see how sugars are doing.  Call sooner if any concerns.  She does not want to start medication at this time.  Diabetic diet handout will be mailed to her.  We discussed importance of good cholesterol control due to diagnosis of diabetes.  We will further discuss pending response to pravastatin.  See below. -  blood glucose meter kit and supplies KIT; Dispense based on patient and insurance preference. Use up to four times daily as directed. (FOR ICD-9 250.00, 250.01).  Dispense: 1 each; Refill: 0  3. Dyslipidemia Good improvement in cholesterol.  Due to diagnosis of diabetes with most recent blood work, we would like lower LDL goal.  We discussed this today.  Since she has some concern about pravastatin causing  muscle cramping, I have encouraged her to take notes and see if this is occurring on a regular basis.  Can hold medication if notes that there is a pattern.  If tolerating medication would suggest increase to 1/2 tablets daily to achieve LDL goal around 80. - pravastatin (PRAVACHOL) 20 MG tablet; Take 1.5 tablets (30 mg total) by mouth at bedtime.  Dispense: 90 tablet; Refill: 1   Follow Up Instructions:  Return follow up sugar recheck 1 month and then plan for repeat bloodwork 3 months (recheck A1C).    I did not refer this patient for an OV in the next 24 hours for this/these issue(s).  I discussed the assessment and treatment plan with the patient. The patient was provided an opportunity to ask questions and all were answered. The patient agreed with the plan and demonstrated an understanding of the instructions.   The patient was advised to call back or seek an in-person evaluation if the symptoms worsen or if the condition fails to improve as anticipated.  I provided 45 minutes of non-face-to-face time during this encounter.  Most of visit was spent reviewing blood work, and discussing methods for helping to control blood sugar.  Discussing goals for blood sugar and cholesterol.   Micheline Rough, MD

## 2019-04-10 ENCOUNTER — Telehealth: Payer: Self-pay | Admitting: *Deleted

## 2019-04-10 NOTE — Telephone Encounter (Signed)
I left a message for the pt to return my call. 

## 2019-04-10 NOTE — Telephone Encounter (Signed)
-----   Message from [place] sent at 04/10/2019  3:37 PM EDT ----- Regarding: pt reports glucometer rx did not go through to pharmacy - please resend

## 2019-04-10 NOTE — Telephone Encounter (Signed)
-----   Message from Caren Macadam, MD sent at 04/09/2019  2:57 PM EDT ----- Please call and set up follow up phone call or doxy in 1 month time. Then schedule bloodwork visit in 3 months time if able (I will order labwork at 1 month follow up). Please mail diabetic diet handout.

## 2019-04-10 NOTE — Telephone Encounter (Signed)
[facility] [phone].

## 2019-04-13 NOTE — Telephone Encounter (Signed)
-  Patient called back and a follow up appt was scheduled for 7/24.  Lab appt scheduled for 9/28 and the pt is aware the diabetic diet was mailed to her home address.  I also advised the pt the Rx for a glucometer was refaxed to her pharmacy.  Patient also stated she called her insurance and was told virtual visits are not covered.  Stated she talked with someone else here previously that was going to check in to this further as they told her they found it strange because this is generally covered.  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  - Patient stated she wanted a visit prior to 7/24 to discuss her diabetes as she is having problems with this.  I offered an appt on 7/17 as this is the only opening available as Dr Ethlyn Gallery is out of the office until 7/6.  Patient asked if she could be seen next week and a note could be sent to the doctor to "squeeze" her in before this date and requested to talk to a nurse or someone here.  Roselyn Reef was informed and the pt is aware call was forwarded.

## 2019-04-13 NOTE — Telephone Encounter (Signed)
Patient called to schedule follow up visits and was informed of the message below.

## 2019-04-17 ENCOUNTER — Ambulatory Visit (INDEPENDENT_AMBULATORY_CARE_PROVIDER_SITE_OTHER): Payer: BC Managed Care – PPO | Admitting: Family Medicine

## 2019-04-17 ENCOUNTER — Other Ambulatory Visit: Payer: Self-pay

## 2019-04-17 ENCOUNTER — Telehealth: Payer: Self-pay | Admitting: *Deleted

## 2019-04-17 ENCOUNTER — Encounter: Payer: Self-pay | Admitting: Family Medicine

## 2019-04-17 DIAGNOSIS — E1165 Type 2 diabetes mellitus with hyperglycemia: Secondary | ICD-10-CM | POA: Diagnosis not present

## 2019-04-17 NOTE — Telephone Encounter (Signed)
Copied from [place] [id number]. Topic: General - Other >> Apr 17, 2019  9:04 AM Keene Breath wrote: Reason for CRM: Patient called to change an appt. She has today.  She needs to give a different contact number for the appt.

## 2019-04-17 NOTE — Progress Notes (Signed)
Virtual Visit via Telephone Note  I connected with Rebecca Paul on 04/17/19 at  1:00 PM EDT by telephone and verified that I am speaking with the correct person using two identifiers.   I discussed the limitations, risks, security and privacy concerns of performing an evaluation and management service by telephone and the availability of in person appointments. I also discussed with the patient that there may be a patient responsible charge related to this service. The patient expressed understanding and agreed to proceed.  Location patient: home Location provider: work or home office Participants present for the call: patient, provider Patient did not have a visit in the prior 7 days to address this/these issue(s).   History of Present Illness:  Acute visit for Diabetes Management: -she had been prediabetic for some time -she had decreased exercise and poor diet the last few months and she had a jump in her Hgba1c recently to 8.1 -she had talked with her PCP and decided on lifestyle changes for 1 month, but now she is worried about waiting 1 month and wonders if she should start a medication  -reports she is "fearful" of diabetes because a lot of family members have diabetes. -she has already made significant changes - cut out sweets, cut out soda and bread, eating less carbs and has started exercising -wants some help understanding a healthy diet and other treatment options  Observations/Objective: Patient sounds cheerful and well on the phone. I do not appreciate any SOB. Speech and thought processing are grossly intact. Patient reported vitals:  Assessment and Plan:  Diabetes Type II -with hyperlipidemia and hypertension  Had a lengthy conversation. Counseled on implications, risks and treatment options for diabetes with HLD and HTN. Congratulated on changes she has made so far. Advised a healthy nutrient rich, whole foods based low sugar diet and regular aerobic exercise.  Discussed medications as an option as well. Also discussed home glucose monitoring as she got a monitor and wanted to know more about this. Discussed fasting and postprandial goals. She has opted to stick with lifestyle changes until her 1 month follow up with Dr. Ethlyn Gallery. She hopes to be able to get her diabetes under control with the lifestyle changes. Perhaps this will also bring her cholesterol down further as well. She may need a stronger statin and can discuss with her PCP. Follow up one month as planned.  Follow Up Instructions:    I did not refer this patient for an OV in the next 24 hours for this/these issue(s).  I discussed the assessment and treatment plan with the patient. The patient was provided an opportunity to ask questions and all were answered. The patient agreed with the plan and demonstrated an understanding of the instructions.   The patient was advised to call back or seek an in-person evaluation if the symptoms worsen or if the condition fails to improve as anticipated.  I provided 30 minutes of non-face-to-face time during this encounter.   Lucretia Kern, DO

## 2019-04-17 NOTE — Telephone Encounter (Signed)
Phone number added to the appts notes to call the home number only-pt declined doxy due to insurance.

## 2019-05-02 NOTE — Telephone Encounter (Signed)
Per [employer] this policy termed 1/49/96. All visits up to and including this date have been paid by [facility] and pt due is for copay amount of $30 per visit. They are covering normal OV codes, which is what we are billing out. Nothing further needed.

## 2019-05-14 ENCOUNTER — Other Ambulatory Visit: Payer: Self-pay

## 2019-05-14 ENCOUNTER — Ambulatory Visit (INDEPENDENT_AMBULATORY_CARE_PROVIDER_SITE_OTHER): Payer: BC Managed Care – PPO | Admitting: Family Medicine

## 2019-05-14 ENCOUNTER — Encounter: Payer: Self-pay | Admitting: Family Medicine

## 2019-05-14 DIAGNOSIS — E1165 Type 2 diabetes mellitus with hyperglycemia: Secondary | ICD-10-CM

## 2019-05-14 DIAGNOSIS — E785 Hyperlipidemia, unspecified: Secondary | ICD-10-CM

## 2019-05-14 DIAGNOSIS — I10 Essential (primary) hypertension: Secondary | ICD-10-CM

## 2019-05-14 MED ORDER — CONTOUR NEXT TEST VI STRP: ORAL_STRIP | 12 refills | Status: DC

## 2019-05-14 NOTE — Progress Notes (Signed)
Virtual Visit via Telephone Note  I connected with Rebecca Paul July  on 05/14/19 at 10:30 AM EDT by telephone and verified that I am speaking with the correct person using two identifiers.   I discussed the limitations, risks, security and privacy concerns of performing an evaluation and management service by telephone and the availability of in person appointments. I also discussed with the patient that there may be a patient responsible charge related to this service. The patient expressed understanding and agreed to proceed.  Location patient: home Location provider: work office Participants present for the call: patient, provider Patient did not have a visit in the prior 7 days to address this/these issue(s).   History of Present Illness: Previous visit with HK due to concerns about initiating tx for diabetes. Has elected to make lifestyle changes for now. Has home monitor.   DMII:thinks she is doing ok with food intake. Felt that visit with HK was very helpful with a lot of good information. Fasting 2 above target, 9 in range. Target was between 70 and 130. Had a couple of bad days last week. Had one day that was 164 before a meal, but states she associates this with what she ate the night before. Had cherries, watermelon. Then had 156 which were on 7/20. 14 day average 11 readings fasting is 112. Average reading for before meals is 116. Didn't do after meals. Has cut out sodas completely.   Drinking seltzer water with no carbs, no sugar. Cut out breads. Just added back in brown rice other day.   Watching carbs in all meals and snacks.   Has had headaches. Not sure if related to cutting out sugar. Also was drinking caffeine in sodas previously.   Appetite has been low in morning.   Exercise is going well. Missed a couple of days last week. Doing 10,000 steps daily. Trying to walk daily (missed a couple of days due to heat). Focusing more on diet right now than exercise. Has lost a couple  of pounds.  Increased statin to 1.5 tablets. Does sometimes get pricky feeling in chest sometimes. Not certain what it is; doesn't last. Not prolonged. Not getting worse with activity. Notes when lying down.  Does have increased stress; not sleeping as well.    Observations/Objective: Patient sounds cheerful and well on the phone. I do not appreciate any SOB. Speech and thought processing are grossly intact. Patient reported vitals:  Assessment and Plan: 1. Uncontrolled type 2 diabetes mellitus with hyperglycemia (HCC) We discussed sugar goals. She has been checking only fasting sugars. I encouraged her to check a 2 hour post prandial so she can see range of sugars during day and get understanding of how they fluctuate during the day. Congratulated her on major changes she has already made with cutting out soda and breads. Her fasting sugars are looking very good. We reviewed goal numbers.   She felt that discussion with HK was very helpful and enjoyed details about how she should be eating. Since she is not due for repeat A1C until September, we will schedule additional virtual visit for discussion with HK to review more sugars/how diet is going. Encouraged her to keep up with exercise as well.   She wants a continuous glucose monitor. I discussed since she is not on insulin (or medication) at this point that this may not be covered. She wants to pay for this out of pocket if not covered so she can have better idea of what sugar  is doing around the clock. (current testing strips sent as well)  - glucose blood (CONTOUR NEXT TEST) test strip; Use as instructed  Dispense: 100 each; Refill: 12 - Continuous Blood Gluc Receiver (FREESTYLE LIBRE 14 DAY READER) DEVI; 1 Device by Does not apply route continuous as needed.  Dispense: 1 Device; Refill: 0 - Continuous Blood Gluc Sensor (FREESTYLE LIBRE 14 DAY SENSOR) MISC; Place 1 Device onto the skin every 14 (fourteen) days.  Dispense: 2 each; Refill:  11  2. Essential hypertension/dyslipidemia: We are going to work on sugar control. Further adjustment to other medications will be made after we get repeat bloodwork around September. She has tremendously changed diet, so I expect improvements with all numbers. She did increase pravastatin to 30mg  and is tolerating this ok.    Follow Up Instructions: w Dr. Maudie Mercury in 3 weeks.   I did not refer this patient for an OV in the next 24 hours for this/these issue(s).  I discussed the assessment and treatment plan with the patient. The patient was provided an opportunity to ask questions and all were answered. The patient agreed with the plan and demonstrated an understanding of the instructions.   The patient was advised to call back or seek an in-person evaluation if the symptoms worsen or if the condition fails to improve as anticipated.  I provided 35 minutes of non-face-to-face time during this encounter. Most of visit spent in counseling regarding carbohydrate goals with meals, sugar monitoring.    Micheline Rough, MD

## 2019-05-15 ENCOUNTER — Telehealth: Payer: Self-pay | Admitting: *Deleted

## 2019-05-15 MED ORDER — FREESTYLE LIBRE 14 DAY SENSOR MISC: 1.0000 | 11 refills | Status: DC

## 2019-05-15 MED ORDER — FREESTYLE LIBRE 14 DAY READER DEVI: 1.0000 | 0 refills | Status: DC | PRN

## 2019-05-15 NOTE — Telephone Encounter (Signed)
I called the pt and she stated she would prefer to follow up with Dr Ethlyn Gallery.  Appt scheduled for 8/26 at 11:45am.

## 2019-05-15 NOTE — Telephone Encounter (Signed)
-----   Message from Caren Macadam, MD sent at 05/15/2019 12:21 AM EDT ----- Please set up 3 week follow up with HK. This is recheck for sugars as patient is anxious about numbers/keeping tight control.

## 2019-05-16 ENCOUNTER — Telehealth: Payer: Self-pay | Admitting: Family Medicine

## 2019-05-16 NOTE — Telephone Encounter (Signed)
Relation to pt: self  Call back number:[phone]  Or [phone]   Reason for call:  Patient states insurance will not cover Continuous Blood Gluc Sensor (FREESTYLE LIBRE 14 DAY SENSOR) MISC. Patient requesting a new Rx insurance will cover dexcom monitor, patient states she will like to speak with nurse as soon as possible to confirm message was received, please advise  [id number] [place] [place] - [address] [phone] (Phone) [phone] (Fax)

## 2019-05-17 NOTE — Telephone Encounter (Signed)
We can place order for any continuous meter that she wants.   If they are saying they will cover dexcom meter I am ok with that. I chose the other because it is the least expensive and I assumed since she is not on insulin and not on medication for diabetes they would not cover any continuous meter. The freestyle Elenor Legato is $60 for sensor scanner and $40 per sensor versus the dexcom which is $300 for transmitter, $600 for receiver, $80 per sensor when I looked them up.   If she wants dexcom it is fine to send rx. I would be surprised if they cover fully and if they cover without PA but if that is what she has discovered it is worth a try!

## 2019-05-17 NOTE — Telephone Encounter (Signed)
Pt called back regarding this glucose meter and is requesting to speak with Pricilla Holm. Please advise.

## 2019-05-18 MED ORDER — DEXCOM G6 SENSOR MISC: 1.0000 | 4 refills | Status: DC

## 2019-05-18 MED ORDER — DEXCOM G6 TRANSMITTER MISC: 1.0000 | 0 refills | Status: DC

## 2019-05-18 MED ORDER — DEXCOM G6 RECEIVER DEVI: 1.0000 | 0 refills | Status: DC

## 2019-05-18 NOTE — Telephone Encounter (Signed)
Pt called back in returning the office call. Pt would like to have the Rx for Dexcom sent in to her pharmacy as soon as provider is able to

## 2019-05-18 NOTE — Telephone Encounter (Signed)
I left a message for the pt to return my call.  CRM also created. 

## 2019-05-18 NOTE — Telephone Encounter (Signed)
Rx done. 

## 2019-05-21 NOTE — Telephone Encounter (Signed)
Pt is wanting a call back after Josem Kaufmann is done

## 2019-05-21 NOTE — Telephone Encounter (Signed)
Pt states this needs prior auth from insurance.Pharmacy has sent information.She would like this taken care of ASAP

## 2019-05-22 NOTE — Telephone Encounter (Signed)
Prior auth for a [place] receiver device sent to [email or web address] and awaiting further information.  I called the pt and left a detailed message at her cell number with this information.

## 2019-05-30 ENCOUNTER — Other Ambulatory Visit: Payer: Self-pay | Admitting: *Deleted

## 2019-05-30 DIAGNOSIS — E785 Hyperlipidemia, unspecified: Secondary | ICD-10-CM

## 2019-05-30 MED ORDER — PRAVASTATIN SODIUM 20 MG PO TABS: 30.0000 mg | ORAL_TABLET | Freq: Every day | ORAL | 1 refills | Status: DC

## 2019-05-30 NOTE — Telephone Encounter (Signed)
Fax received from [facility] stating the request was denied and this was sent to be scanned.  I left a detailed message at the pts cell number with this information.

## 2019-05-30 NOTE — Telephone Encounter (Signed)
Rx done. 

## 2019-06-11 DIAGNOSIS — Z1231 Encounter for screening mammogram for malignant neoplasm of breast: Secondary | ICD-10-CM | POA: Diagnosis not present

## 2019-06-11 DIAGNOSIS — Z01419 Encounter for gynecological examination (general) (routine) without abnormal findings: Secondary | ICD-10-CM | POA: Diagnosis not present

## 2019-06-11 DIAGNOSIS — Z6829 Body mass index (BMI) 29.0-29.9, adult: Secondary | ICD-10-CM | POA: Diagnosis not present

## 2019-06-13 ENCOUNTER — Ambulatory Visit (INDEPENDENT_AMBULATORY_CARE_PROVIDER_SITE_OTHER): Payer: BC Managed Care – PPO | Admitting: Family Medicine

## 2019-06-13 ENCOUNTER — Encounter: Payer: Self-pay | Admitting: Family Medicine

## 2019-06-13 ENCOUNTER — Other Ambulatory Visit: Payer: Self-pay

## 2019-06-13 VITALS — BP 112/70 | Temp 98.2°F | Wt 152.6 lb

## 2019-06-13 DIAGNOSIS — G629 Polyneuropathy, unspecified: Secondary | ICD-10-CM | POA: Diagnosis not present

## 2019-06-13 DIAGNOSIS — E785 Hyperlipidemia, unspecified: Secondary | ICD-10-CM | POA: Diagnosis not present

## 2019-06-13 DIAGNOSIS — I10 Essential (primary) hypertension: Secondary | ICD-10-CM | POA: Diagnosis not present

## 2019-06-13 DIAGNOSIS — E1165 Type 2 diabetes mellitus with hyperglycemia: Secondary | ICD-10-CM

## 2019-06-13 NOTE — Progress Notes (Signed)
Virtual Visit via Telephone Note  I connected with Rebecca Paul on 06/13/19 at 11:45 AM EDT by telephone and verified that I am speaking with the correct person using two identifiers.   I discussed the limitations, risks, security and privacy concerns of performing an evaluation and management service by telephone and the availability of in person appointments. I also discussed with the patient that there may be a patient responsible charge related to this service. The patient expressed understanding and agreed to proceed.  Location patient: home Location provider: work office Participants present for the call: patient, provider Patient did not have a visit in the prior 7 days to address this/these issue(s).   History of Present Illness: At last visit was working on exercise add healthier eating. She had lost some weight. She was going to get glucometer for tighter monitoring.   This morning didn't eat breakfast and was concerned because glucose was 163 at 11:45 but ate breakfast 11:15 (soup/crackers).   Gets headaches if she doesn't eat. Frequently forgets breakfast but is good about eating lunch/dinner. Feels that appetite is decreased in morning.   Still doing the 20mg  of the pravastatin.   Sugars have been 115-120 average in 30 days. She set up 70-126 as normal (fasting and preprandial) and set up to 155 for post meal goal. And most sugars are in this range.   Slacked off on exercise some. Became more focus on the eating and trying to pinpoint things that elevate her sugar. Thinking if she focuses on this with diet she will get some great improvements. Exercising 1-2 times/week. Has cut out sugary drinks from diet.   Has been having some tingling in toes. Thinks that this is due to back issues. Worried about this. Not all the time. Left foot big toe mostly. Just a little tingling. Has discomfort in thigh area on left side as well. Has to follow up for elbow as well. No discoloration  of skin or temperature changes to feet that she has noted.    Stress level is up. Worried about COVID, being back at work/in office. Affects sleep/falling asleep. Working hours different than normal with doing work at home.   Observations/Objective: Patient sounds cheerful and well on the phone. I do not appreciate any SOB. Speech and thought processing are grossly intact. Patient reported vitals recorded.  Assessment and Plan: 1. Uncontrolled type 2 diabetes mellitus with hyperglycemia ([place]) She is doing well with limiting carb intake and healthier eating. Keep up with this and work on adding back in exercise.  - Hemoglobin A1c; Future - Microalbumin / creatinine urine ratio; Future  2. Essential hypertension Well controlled; continue current medication.  - Comprehensive metabolic panel; Future  3. Dyslipidemia Would like to recheck numbers on pravastatin 20mg  before increasing. We will plan med changes pending lab results in September.  - Lipid panel; Future  4. Neuropathy Suspect neuropathy is from back, but will also make sure no obvious vitamin abnormalities. - Vitamin B12; Future   Follow Up Instructions:  Return in about 2 weeks (around 06/27/2019), or bloodwork in September (on or after 9/15). follow up appointment pending those results..    I did not refer this patient for an OV in the next 24 hours for this/these issue(s).  I discussed the assessment and treatment plan with the patient. The patient was provided an opportunity to ask questions and all were answered. The patient agreed with the plan and demonstrated an understanding of the instructions.   The  patient was advised to call back or seek an in-person evaluation if the symptoms worsen or if the condition fails to improve as anticipated.  I provided 27 minutes of non-face-to-face time during this encounter.   Micheline Rough, MD

## 2019-06-14 ENCOUNTER — Telehealth: Payer: Self-pay | Admitting: *Deleted

## 2019-06-14 NOTE — Telephone Encounter (Signed)
Lab appt scheduled for 9/21 at 9:20am.

## 2019-06-14 NOTE — Telephone Encounter (Signed)
-----   Message from Caren Macadam, MD sent at 06/13/2019 12:36 PM EDT ----- Please set up labwork 9/15 or later for her.

## 2019-07-09 ENCOUNTER — Telehealth: Payer: Self-pay | Admitting: Family Medicine

## 2019-07-09 ENCOUNTER — Other Ambulatory Visit: Payer: BC Managed Care – PPO

## 2019-07-09 MED ORDER — FAMCICLOVIR 250 MG PO TABS: 250.0000 mg | ORAL_TABLET | Freq: Two times a day (BID) | ORAL | 1 refills | Status: DC | PRN

## 2019-07-09 NOTE — Telephone Encounter (Signed)
rx refill famciclovir (FAMVIR) 250 MG tablet [id number]  [id number] [place], [place] [phone] (Phone) [phone] (Fax)   Patient needs a new prescription on medication she only takes when she needs medication.  Patient does have a full bottle but patient states it is expired.

## 2019-07-09 NOTE — Telephone Encounter (Signed)
Ok to refill 

## 2019-07-09 NOTE — Telephone Encounter (Signed)
Rx done. 

## 2019-07-16 ENCOUNTER — Other Ambulatory Visit (INDEPENDENT_AMBULATORY_CARE_PROVIDER_SITE_OTHER): Payer: BC Managed Care – PPO

## 2019-07-16 ENCOUNTER — Other Ambulatory Visit: Payer: Self-pay

## 2019-07-16 DIAGNOSIS — G629 Polyneuropathy, unspecified: Secondary | ICD-10-CM | POA: Diagnosis not present

## 2019-07-16 DIAGNOSIS — Z23 Encounter for immunization: Secondary | ICD-10-CM | POA: Diagnosis not present

## 2019-07-16 DIAGNOSIS — R7303 Prediabetes: Secondary | ICD-10-CM

## 2019-07-16 DIAGNOSIS — E785 Hyperlipidemia, unspecified: Secondary | ICD-10-CM | POA: Diagnosis not present

## 2019-07-16 DIAGNOSIS — I10 Essential (primary) hypertension: Secondary | ICD-10-CM

## 2019-07-16 DIAGNOSIS — E1165 Type 2 diabetes mellitus with hyperglycemia: Secondary | ICD-10-CM

## 2019-07-16 LAB — COMPREHENSIVE METABOLIC PANEL
ALT: 12 U/L (ref 0–35)
AST: 12 U/L (ref 0–37)
Albumin: 4.4 g/dL (ref 3.5–5.2)
Alkaline Phosphatase: 55 U/L (ref 39–117)
BUN: 17 mg/dL (ref 6–23)
CO2: 27 mEq/L (ref 19–32)
Calcium: 11 mg/dL — ABNORMAL HIGH (ref 8.4–10.5)
Chloride: 104 mEq/L (ref 96–112)
Creatinine, Ser: 0.83 mg/dL (ref 0.40–1.20)
GFR: 84.22 mL/min (ref >60.00)
Glucose, Bld: 103 mg/dL — ABNORMAL HIGH (ref 70–99)
Potassium: 4.2 mEq/L (ref 3.5–5.1)
Sodium: 139 mEq/L (ref 135–145)
Total Bilirubin: 0.6 mg/dL (ref 0.2–1.2)
Total Protein: 7.8 g/dL (ref 6.0–8.3)

## 2019-07-16 LAB — LIPID PANEL
Cholesterol: 187 mg/dL (ref 0–200)
HDL: 52.1 mg/dL (ref >39.00)
LDL Cholesterol: 117 mg/dL — ABNORMAL HIGH (ref 0–99)
NonHDL: 135.17
Total CHOL/HDL Ratio: 4
Triglycerides: 91 mg/dL (ref 0.0–149.0)
VLDL: 18.2 mg/dL (ref 0.0–40.0)

## 2019-07-16 LAB — MICROALBUMIN / CREATININE URINE RATIO
Creatinine,U: 87.7 mg/dL
Microalb Creat Ratio: 0.8 mg/g (ref 0.0–30.0)
Microalb, Ur: <0.7 mg/dL (ref 0.0–1.9)

## 2019-07-16 LAB — HEMOGLOBIN A1C: Hgb A1c MFr Bld: 5.9 % (ref 4.6–6.5)

## 2019-07-16 LAB — VITAMIN B12: Vitamin B-12: 536 pg/mL (ref 211–911)

## 2019-07-17 MED ORDER — PRAVASTATIN SODIUM 40 MG PO TABS: 40.0000 mg | ORAL_TABLET | Freq: Every day | ORAL | 1 refills | Status: DC

## 2019-07-17 NOTE — Addendum Note (Signed)
Addended by: Agnes Lawrence on: 07/17/2019 04:08 PM   Modules accepted: Orders

## 2019-07-26 ENCOUNTER — Ambulatory Visit: Payer: BC Managed Care – PPO

## 2019-07-31 ENCOUNTER — Other Ambulatory Visit: Payer: Self-pay

## 2019-07-31 ENCOUNTER — Encounter: Payer: Self-pay | Admitting: Family Medicine

## 2019-07-31 ENCOUNTER — Ambulatory Visit: Payer: BC Managed Care – PPO | Admitting: Family Medicine

## 2019-07-31 VITALS — BP 132/76 | HR 68 | Temp 97.7°F | Ht 61.0 in | Wt 153.6 lb

## 2019-07-31 DIAGNOSIS — M542 Cervicalgia: Secondary | ICD-10-CM

## 2019-07-31 DIAGNOSIS — M549 Dorsalgia, unspecified: Secondary | ICD-10-CM | POA: Diagnosis not present

## 2019-07-31 DIAGNOSIS — Z23 Encounter for immunization: Secondary | ICD-10-CM | POA: Diagnosis not present

## 2019-07-31 MED ORDER — CYCLOBENZAPRINE HCL 5 MG PO TABS: ORAL_TABLET | ORAL | 0 refills | Status: DC

## 2019-07-31 NOTE — Patient Instructions (Addendum)
Try some topical heat and use topical sports cream  Consider muscle massage.   Use the muscle relaxer at night- may cause some sedation  Consider short term use of Aleve.  Be in touch in 1-2 weeks if not better.
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
## 2019-08-06 ENCOUNTER — Ambulatory Visit: Payer: BC Managed Care – PPO | Admitting: Family Medicine

## 2019-08-06 ENCOUNTER — Encounter: Payer: Self-pay | Admitting: Family Medicine

## 2019-08-06 ENCOUNTER — Other Ambulatory Visit: Payer: Self-pay

## 2019-08-06 VITALS — BP 110/70 | HR 58 | Temp 97.5°F | Resp 12 | Ht 61.0 in | Wt 152.8 lb

## 2019-08-06 DIAGNOSIS — Z20828 Contact with and (suspected) exposure to other viral communicable diseases: Secondary | ICD-10-CM | POA: Diagnosis not present

## 2019-08-06 DIAGNOSIS — J343 Hypertrophy of nasal turbinates: Secondary | ICD-10-CM | POA: Diagnosis not present

## 2019-08-06 DIAGNOSIS — K219 Gastro-esophageal reflux disease without esophagitis: Secondary | ICD-10-CM | POA: Diagnosis not present

## 2019-08-06 DIAGNOSIS — F419 Anxiety disorder, unspecified: Secondary | ICD-10-CM | POA: Diagnosis not present

## 2019-08-06 DIAGNOSIS — Z20822 Contact with and (suspected) exposure to covid-19: Secondary | ICD-10-CM

## 2019-08-06 LAB — SARS-COV-2 IGG: SARS-COV-2 IgG: 0.27
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
## 2019-08-06 NOTE — Patient Instructions (Signed)
A few things to remember from today's visit:   Exposure to COVID-19 virus - Plan: SARS-COV-2 IgG        COVID-19: How to Protect Yourself and Others Know how it spreads  There is currently no vaccine to prevent coronavirus disease 2019 (COVID-19).  The best way to prevent illness is to avoid being exposed to this virus.  The virus is thought to spread mainly from person-to-person. ? Between people who are in close contact with one another (within about 6 feet). ? Through respiratory droplets produced when an infected person coughs, sneezes or talks. ? These droplets can land in the mouths or noses of people who are nearby or possibly be inhaled into the lungs. ? Some recent studies have suggested that COVID-19 may be spread by people who are not showing symptoms. Everyone should Clean your hands often  Wash your hands often with soap and water for at least 20 seconds especially after you have been in a public place, or after blowing your nose, coughing, or sneezing.  If soap and water are not readily available, use a hand sanitizer that contains at least 60% alcohol. Cover all surfaces of your hands and rub them together until they feel dry.  Avoid touching your eyes, nose, and mouth with unwashed hands. Avoid close contact  Stay home if you are sick.  Avoid close contact with people who are sick.  Put distance between yourself and other people. ? Remember that some people without symptoms may be able to spread virus. ? This is especially important for people who are at higher risk of getting very [employer]ain[employer].com.cy Cover your mouth and nose with a cloth face cover when around others  You could spread COVID-19 to others even if you do not feel sick.  Everyone should wear a cloth face cover when they have to go out in public, for example to the grocery store or to pick up other necessities. ? Cloth face  coverings should not be placed on young children under age 25, anyone who has trouble breathing, or is unconscious, incapacitated or otherwise unable to remove the mask without assistance.  The cloth face cover is meant to protect other people in case you are infected.  Do NOT use a facemask meant for a Dietitian.  Continue to keep about 6 feet between yourself and others. The cloth face cover is not a substitute for social distancing. Cover coughs and sneezes  If you are in a private setting and do not have on your cloth face covering, remember to always cover your mouth and nose with a tissue when you cough or sneeze or use the inside of your elbow.  Throw used tissues in the trash.  Immediately wash your hands with soap and water for at least 20 seconds. If soap and water are not readily available, clean your hands with a hand sanitizer that contains at least 60% alcohol. Clean and disinfect  Clean AND disinfect frequently touched surfaces daily. This includes tables, doorknobs, light switches, countertops, handles, desks, phones, keyboards, toilets, faucets, and sinks. [employer]ackRewards[employer]fr  If surfaces are dirty, clean them: Use detergent or soap and water prior to disinfection.  Then, use a household disinfectant. You can see a list of EPA-registered household disinfectants here. michellinders.com 02/20/2019 This information is not intended to replace advice given to you by your health care provider. Make sure you discuss any questions you have with your health care provider. Document Released: 01/30/2019 Document Revised: 02/28/2019 Document Reviewed: 01/30/2019  Elsevier Patient Education  [employer].

## 2019-08-07 ENCOUNTER — Telehealth: Payer: Self-pay

## 2019-08-07 ENCOUNTER — Encounter: Payer: Self-pay | Admitting: Family Medicine

## 2019-08-07 NOTE — Telephone Encounter (Signed)
Copied from [place] [id number]. Topic: Quick Communication - Lab Results (Clinic Use ONLY) >> Aug 07, 2019  3:32 PM Pauline Good wrote: Pt want to know the results of her antibiotic test

## 2019-08-13 NOTE — Telephone Encounter (Signed)
This is now in pt Mychart

## 2019-08-17 ENCOUNTER — Telehealth: Payer: Self-pay | Admitting: Family Medicine

## 2019-08-17 DIAGNOSIS — M549 Dorsalgia, unspecified: Secondary | ICD-10-CM

## 2019-08-17 NOTE — Telephone Encounter (Signed)
No answer at the pts work number-I left a message at the pts cell number  to return a call to the office.  CRM also created.
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
## 2019-08-17 NOTE — Telephone Encounter (Signed)
I haven't seen her for this. She saw Dr. Elease Hashimoto on the 13th and was given flexeril. He suggested PT for her if not improving in 2 weeks time. She saw Dr. Martinique last, and I think she was somewhat better at that time.   I am ok with PT referral if she is wanting this. If she is just wanting to try something different then let me know. If new/different symptoms let me know.

## 2019-08-21 NOTE — Telephone Encounter (Signed)
Pt returned call and requested that a referral for PT be submitted.

## 2019-08-21 NOTE — Telephone Encounter (Signed)
Referral for PT placed

## 2019-08-27 ENCOUNTER — Other Ambulatory Visit: Payer: Self-pay | Admitting: Family Medicine

## 2019-09-03 ENCOUNTER — Ambulatory Visit: Payer: BC Managed Care – PPO

## 2019-09-24 ENCOUNTER — Ambulatory Visit: Payer: BC Managed Care – PPO | Attending: Family Medicine

## 2019-09-24 ENCOUNTER — Other Ambulatory Visit: Payer: Self-pay

## 2019-09-24 DIAGNOSIS — R293 Abnormal posture: Secondary | ICD-10-CM | POA: Diagnosis not present

## 2019-09-24 DIAGNOSIS — M546 Pain in thoracic spine: Secondary | ICD-10-CM | POA: Insufficient documentation

## 2019-09-24 DIAGNOSIS — M25511 Pain in right shoulder: Secondary | ICD-10-CM | POA: Diagnosis not present

## 2019-09-24 DIAGNOSIS — M6281 Muscle weakness (generalized): Secondary | ICD-10-CM

## 2019-09-24 DIAGNOSIS — M542 Cervicalgia: Secondary | ICD-10-CM | POA: Diagnosis not present

## 2019-09-24 DIAGNOSIS — G8929 Other chronic pain: Secondary | ICD-10-CM | POA: Insufficient documentation

## 2019-09-24 DIAGNOSIS — R252 Cramp and spasm: Secondary | ICD-10-CM | POA: Diagnosis not present
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
## 2019-09-24 NOTE — Patient Instructions (Addendum)
Access Code: [email or web address]  URL: [email or web address]  Date: 09/24/2019  Prepared by: Sigurd Sos   Exercises Seated Cervical Flexion AROM - 3 reps - 1 sets - 20 hold - 4x daily                            - 7x weekly Seated Cervical Sidebending AROM - 3 reps - 1 sets - 20 hold - 4x daily - 7x weekly Seated Cervical Rotation AROM - 3 reps - 1 sets - 20 hold - 4x daily - 7x weekly Seated Correct Posture - 10 reps - 3 sets - 1x daily - 7x weekly  Trigger Point Dry Needling  . What is Trigger Point Dry Needling (DN)? o DN is a physical therapy technique used to treat muscle pain and dysfunction. Specifically, DN helps deactivate muscle trigger points (muscle knots).  o A thin filiform needle is used to penetrate the skin and stimulate the underlying trigger point. The goal is for a local twitch response (LTR) to occur and for the trigger point to relax. No medication of any kind is injected during the procedure.   . What Does Trigger Point Dry Needling Feel Like?  o The procedure feels different for each individual patient. Some patients report that they do not actually feel the needle enter the skin and overall the process is not painful. Very mild bleeding may occur. However, many patients feel a deep cramping in the muscle in which the needle was inserted. This is the local twitch response.   Marland Kitchen How Will I feel after the treatment? o Soreness is normal, and the onset of soreness may not occur for a few hours. Typically this soreness does not last longer than two days.  o Bruising is uncommon, however; ice can be used to decrease any possible bruising.  o In rare cases feeling tired or nauseous after the treatment is normal. In addition, your symptoms may get worse before they get better, this period will typically not last longer than 24 hours.   . What Can I do After My Treatment? o Increase your hydration by drinking more water for the next 24 hours. o You may place ice or heat  on the areas treated that have become sore, however, do not use heat on inflamed or bruised areas. Heat often brings more relief post needling. o You can continue your regular activities, but vigorous activity is not recommended initially after the treatment for 24 hours. o DN is best combined with other physical therapy such as strengthening, stretching, and other therapies.    [facility] [address], [place], [place] [address] Phone # [phone] Fax [phone]

## 2019-10-01 ENCOUNTER — Other Ambulatory Visit: Payer: Self-pay

## 2019-10-01 ENCOUNTER — Encounter: Payer: Self-pay | Admitting: Physical Therapy

## 2019-10-01 ENCOUNTER — Ambulatory Visit: Payer: BC Managed Care – PPO | Admitting: Physical Therapy

## 2019-10-01 DIAGNOSIS — M25511 Pain in right shoulder: Secondary | ICD-10-CM

## 2019-10-01 DIAGNOSIS — R252 Cramp and spasm: Secondary | ICD-10-CM

## 2019-10-01 DIAGNOSIS — M542 Cervicalgia: Secondary | ICD-10-CM

## 2019-10-01 DIAGNOSIS — M546 Pain in thoracic spine: Secondary | ICD-10-CM

## 2019-10-01 DIAGNOSIS — R293 Abnormal posture: Secondary | ICD-10-CM

## 2019-10-01 DIAGNOSIS — G8929 Other chronic pain: Secondary | ICD-10-CM | POA: Diagnosis not present

## 2019-10-01 DIAGNOSIS — M6281 Muscle weakness (generalized): Secondary | ICD-10-CM | POA: Diagnosis not present

## 2019-10-01 NOTE — Therapy (Signed)
[facility] [address], [place], [place], [address] Phone: [phone]   Fax:  [phone]  Physical Therapy Treatment  Patient Details  Name: Rebecca Paul MRN: [id number] Date of Birth: 1956/02/02 Referring Provider (PT): Lillie Fragmin, MD   Encounter Date: 10/01/2019  PT End of Session - 10/01/19 1524    Visit Number  2    Date for PT Re-Evaluation  11/19/19    Authorization Type  BCBS    PT Start Time  [id number]    PT Stop Time  1540    PT Time Calculation (min)  55 min    Activity Tolerance  Patient tolerated treatment well    Behavior During Therapy  [facility] for tasks assessed/performed       Past Medical History:  Diagnosis Date  . Allergy    more sinus issues   . Anemia    yrs past as child , with pregnancy   . Arthritis    shoulders - not diagnosed   . Headache(784.0)   . Hyperlipidemia   . Hypertension   . IGT (impaired glucose tolerance)   . Kidney stone   . Osteopenia   . Plantar fasciitis     Past Surgical History:  Procedure Laterality Date  . ABDOMINAL HYSTERECTOMY     fibroids  . CESAREAN SECTION    . COLONOSCOPY  01/2018   normal  . OOPHORECTOMY     dermoid tumor  . TONSILLECTOMY AND ADENOIDECTOMY      There were no vitals filed for this visit.  Subjective Assessment - 10/01/19 1451    Subjective  Patient reports she felt the same after the initial evaluation. I have not done the exercises that were given to me.    Diagnostic tests  none    Patient Stated Goals  reduce pain    Currently in Pain?  Yes    Pain Score  6     Pain Location  Neck   thoracic   Pain Orientation  Right    Pain Descriptors / Indicators  Aching;Nagging    Pain Type  Chronic pain    Pain Onset  More than a month ago    Pain Frequency  Intermittent    Aggravating Factors   at night, using arms, sitting still to read    Pain Relieving Factors  heat, Tylenol    Multiple Pain Sites  No                        [facility] Adult PT Treatment/Exercise - 10/01/19 0001      Therapeutic Activites    Therapeutic Activities  ADL's    ADL's  correct posture in sitting and in the car, how to eat and do text with spinal neutral, sleeping position with using a rolled up towel to support the cervcial lordosis,       Modalities   Modalities  Electrical Stimulation;Moist Heat      Moist Heat Therapy   Number Minutes Moist Heat  15 Minutes    Moist Heat Location  Cervical   supine     Electrical Stimulation   Electrical Stimulation Location  cervical     Electrical Stimulation Action  IFC    Electrical Stimulation Parameters  To patient tolerance, 15 min    Electrical Stimulation Goals  Pain      Manual Therapy   Manual Therapy  Soft tissue mobilization;Joint mobilization    Joint Mobilization  sideglide to C2-C7    Soft tissue mobilization  soft tissue work to cervical paraspinals, suboccipitals, anterior neck muscles, upper trap      Neck Exercises: Stretches   Other Neck Stretches  cervical flexion stretch; cervical sidebending both ways; cervical rotation both ways to review HEP    Other Neck Stretches  cervical P/ROM for bil. rotation and sidebending               PT Short Term Goals - 09/24/19 1027      PT SHORT TERM GOAL #1   Title  be independent in initial HEP    Time  4    Period  Weeks    Status  New    Target Date  10/22/19      PT SHORT TERM GOAL #2   Title  sit with neutral seated postural to improve alignment and reduce strain with work tasks    Time  4    Period  Weeks    Status  New    Target Date  10/22/19      PT SHORT TERM GOAL #3   Title  report a 30% reduction in neck/thoracic pain with sleep and with with use of Rt UE    Time  4    Period  Weeks    Status  New    Target Date  10/22/19        PT Long Term Goals - 09/24/19 1055      PT LONG TERM GOAL #1   Title  be independent in advanced HEP    Time  8    Period   Weeks    Status  New    Target Date  11/19/19      PT LONG TERM GOAL #2   Title  reduce FOTO to < or = to 33% limitation    Time  8    Period  Weeks    Status  New    Target Date  11/19/19      PT LONG TERM GOAL #3   Title  report a 70% reduction in neck and thoracic pain with work tasks    Time  8    Period  Weeks    Status  New    Target Date  11/19/19      PT LONG TERM GOAL #4   Title  demonstrate 4+/5 to 5/5 UE strength to improve use of UEs with functional tasks and lifting overhead    Time  8    Period  Weeks    Status  New    Target Date  11/19/19            Plan - 10/01/19 1453    Clinical Impression Statement  Patient had tightness in the cervical muscles and joints. Patient was educated on ways to position herself in sitting and in bed to reduce strain on cervical. Patient was able to do her HEP correctly and understands the importance of doing them to elongate the tissue. Patient will benefit from skilled therapy for manual to adderess muscle tension, A/ROM, postureal strength and pain management.    Examination-Activity Limitations  Carry;Lift;Reach Overhead;Sleep    Examination-Participation Restrictions  Cleaning;Community Activity    Stability/Clinical Decision Making  Stable/Uncomplicated    Rehab Potential  Excellent    PT Frequency  2x / week    PT Duration  8 weeks    PT Treatment/Interventions  ADLs/Self Care Home Management;Cryotherapy;Electrical Stimulation;Ultrasound;Traction;Moist Heat;Therapeutic activities;Therapeutic exercise;Patient/family education;Neuromuscular  re-education;Manual techniques;Passive range of motion;Dry needling;Taping;Joint Manipulations    PT Next Visit Plan  Dry needling to Rt Upper trap, shoulder and neck if pt agrees,  postural strength, manual; did not get to look over the info on dry needling so she will decide next time.    PT Home Exercise Plan  Access Code: [email or web address]    Recommended Other Services  MD signed the initial  note    Consulted and Agree with Plan of Care  Patient       Patient will benefit from skilled therapeutic intervention in order to improve the following deficits and impairments:  Decreased activity tolerance, Decreased strength, Decreased endurance, Increased muscle spasms, Impaired flexibility, Postural dysfunction, Improper body mechanics, Pain  Visit Diagnosis: Cervicalgia  Pain in thoracic spine  Cramp and spasm  Abnormal posture  Chronic right shoulder pain  Muscle weakness (generalized)     Problem List Patient Active Problem List   Diagnosis Date Noted  . Diabetes mellitus type II, uncontrolled ([place]) 04/09/2019  . BACK PAIN 07/06/2010  . Essential hypertension 04/22/2009  . Dyslipidemia 04/02/2008  . NEPHROLITHIASIS 10/30/2007    Earlie Counts, PT 10/01/19 3:28 PM   [facility] [address] [place], [place], [address] Phone: [phone]   Fax:  [phone]  Name: NATAKI ARTS MRN: [id number] Date of Birth: 07-19-1956

## 2019-10-09 ENCOUNTER — Ambulatory Visit: Payer: BC Managed Care – PPO

## 2019-10-09 ENCOUNTER — Other Ambulatory Visit: Payer: Self-pay

## 2019-10-09 DIAGNOSIS — R293 Abnormal posture: Secondary | ICD-10-CM | POA: Diagnosis not present

## 2019-10-09 DIAGNOSIS — M546 Pain in thoracic spine: Secondary | ICD-10-CM | POA: Diagnosis not present

## 2019-10-09 DIAGNOSIS — M6281 Muscle weakness (generalized): Secondary | ICD-10-CM

## 2019-10-09 DIAGNOSIS — R252 Cramp and spasm: Secondary | ICD-10-CM | POA: Diagnosis not present

## 2019-10-09 DIAGNOSIS — G8929 Other chronic pain: Secondary | ICD-10-CM | POA: Diagnosis not present

## 2019-10-09 DIAGNOSIS — M542 Cervicalgia: Secondary | ICD-10-CM | POA: Diagnosis not present

## 2019-10-09 DIAGNOSIS — M25511 Pain in right shoulder: Secondary | ICD-10-CM | POA: Diagnosis not present

## 2019-10-09 NOTE — Therapy (Signed)
[facility] [address], [place], [place], [address] Phone: [phone]   Fax:  [phone]  Physical Therapy Treatment  Patient Details  Name: Rebecca Paul MRN: [id number] Date of Birth: 03-01-1956 Referring Provider (PT): Lillie Fragmin, MD   Encounter Date: 10/09/2019  PT End of Session - 10/09/19 1104    Visit Number  3    Date for PT Re-Evaluation  11/19/19    Authorization Type  BCBS    PT Start Time  1026   late   PT Stop Time  1115    PT Time Calculation (min)  49 min    Activity Tolerance  Patient tolerated treatment well    Behavior During Therapy  [facility] for tasks assessed/performed       Past Medical History:  Diagnosis Date  . Allergy    more sinus issues   . Anemia    yrs past as child , with pregnancy   . Arthritis    shoulders - not diagnosed   . Headache(784.0)   . Hyperlipidemia   . Hypertension   . IGT (impaired glucose tolerance)   . Kidney stone   . Osteopenia   . Plantar fasciitis     Past Surgical History:  Procedure Laterality Date  . ABDOMINAL HYSTERECTOMY     fibroids  . CESAREAN SECTION    . COLONOSCOPY  01/2018   normal  . OOPHORECTOMY     dermoid tumor  . TONSILLECTOMY AND ADENOIDECTOMY      There were no vitals filed for this visit.  Subjective Assessment - 10/09/19 1026    Subjective  I am about the same.  I don't want to do dry needling.    Currently in Pain?  Yes    Pain Score  6     Pain Location  Neck    Pain Orientation  Right    Pain Descriptors / Indicators  Aching;Nagging    Pain Type  Chronic pain    Pain Onset  More than a month ago    Aggravating Factors   turning head to the Rt, use of Rt arm    Pain Relieving Factors  heat, tylenol                       [facility] Adult PT Treatment/Exercise - 10/09/19 0001      Exercises   Exercises  Neck      Neck Exercises: Sidelying   Other Sidelying Exercise  open book stretch x 10  bil.       Modalities   Modalities  Electrical Stimulation;Moist Heat      Moist Heat Therapy   Number Minutes Moist Heat  15 Minutes    Moist Heat Location  Cervical   supine     Electrical Stimulation   Electrical Stimulation Location  cervical     Electrical Stimulation Action  IFC    Electrical Stimulation Parameters  15 minutes    Electrical Stimulation Goals  Pain      Manual Therapy   Manual Therapy  Soft tissue mobilization;Joint mobilization    Joint Mobilization  PA mobs C4-5    Soft tissue mobilization  trigger point release to Rt upper traps, passive sidebending for upper trap stretch      Neck Exercises: Stretches   Other Neck Stretches  cervical flexion stretch; cervical sidebending both ways; cervical rotation both ways to review HEP  PT Short Term Goals - 09/24/19 1027      PT SHORT TERM GOAL #1   Title  be independent in initial HEP    Time  4    Period  Weeks    Status  New    Target Date  10/22/19      PT SHORT TERM GOAL #2   Title  sit with neutral seated postural to improve alignment and reduce strain with work tasks    Time  4    Period  Weeks    Status  New    Target Date  10/22/19      PT SHORT TERM GOAL #3   Title  report a 30% reduction in neck/thoracic pain with sleep and with with use of Rt UE    Time  4    Period  Weeks    Status  New    Target Date  10/22/19        PT Long Term Goals - 09/24/19 1055      PT LONG TERM GOAL #1   Title  be independent in advanced HEP    Time  8    Period  Weeks    Status  New    Target Date  11/19/19      PT LONG TERM GOAL #2   Title  reduce FOTO to < or = to 33% limitation    Time  8    Period  Weeks    Status  New    Target Date  11/19/19      PT LONG TERM GOAL #3   Title  report a 70% reduction in neck and thoracic pain with work tasks    Time  8    Period  Weeks    Status  New    Target Date  11/19/19      PT LONG TERM GOAL #4   Title  demonstrate 4+/5 to  5/5 UE strength to improve use of UEs with functional tasks and lifting overhead    Time  8    Period  Weeks    Status  New    Target Date  11/19/19            Plan - 10/09/19 1044    Clinical Impression Statement  Pt denies any change in Rt upper trap/cervical/thoracic pain.  Pt is consistent in flexibility exercises.  PT issued pt information regarding home TENs unit for pain relief.  Pt with good cervical A/ROM with pain going to the Rt into rotation and sidebending.  Pt with Rt upper trap pain with Rt UE movement today.  Pt with tension and trigger points in Rt upper trap and reduced cervical segmental mobility with mobs today.  Pt received good pain reduction with electrical stimulation and heat.  Pt will continue to benefit from skilled PT for pain relief, muscle trigger point release and improved flexibility and postural strength.    PT Frequency  2x / week    PT Duration  8 weeks    PT Treatment/Interventions  ADLs/Self Care Home Management;Cryotherapy;Electrical Stimulation;Ultrasound;Traction;Moist Heat;Therapeutic activities;Therapeutic exercise;Patient/family education;Neuromuscular re-education;Manual techniques;Passive range of motion;Dry needling;Taping;Joint Manipulations    PT Next Visit Plan  manual, gentle strength, flexibility and modalities for pain    PT Home Exercise Plan  Access Code: [email or web address]    Consulted and Agree with Plan of Care  Patient       Patient will benefit from skilled therapeutic intervention in order to improve the following  deficits and impairments:  Decreased activity tolerance, Decreased strength, Decreased endurance, Increased muscle spasms, Impaired flexibility, Postural dysfunction, Improper body mechanics, Pain  Visit Diagnosis: Pain in thoracic spine  Cervicalgia  Cramp and spasm  Abnormal posture  Chronic right shoulder pain  Muscle weakness (generalized)     Problem List Patient Active Problem List   Diagnosis Date Noted  .  Diabetes mellitus type II, uncontrolled ([place]) 04/09/2019  . BACK PAIN 07/06/2010  . Essential hypertension 04/22/2009  . Dyslipidemia 04/02/2008  . NEPHROLITHIASIS 10/30/2007     Sigurd Sos, PT 10/09/19 11:05 AM  [facility] [address], [place], [place], [address] Phone: [phone]   Fax:  [phone]  Name: KEAYLA SCHWEND MRN: [id number] Date of Birth: 14-Sep-1956

## 2019-10-17 ENCOUNTER — Telehealth: Payer: Self-pay | Admitting: Family Medicine

## 2019-10-17 NOTE — Telephone Encounter (Signed)
I called the pt and informed her we do not have the vaccine here and advised she look at the website:  [email or web address] for more information and she agreed.

## 2019-10-17 NOTE — Telephone Encounter (Signed)
Patient called and would like to speak with a CMA about how she can get covid vaccine and has more question about the vaccine also. Please call patient back, thanks.

## 2019-10-22 ENCOUNTER — Ambulatory Visit: Payer: BC Managed Care – PPO | Attending: Family Medicine

## 2019-10-22 ENCOUNTER — Other Ambulatory Visit: Payer: Self-pay

## 2019-10-22 DIAGNOSIS — M546 Pain in thoracic spine: Secondary | ICD-10-CM | POA: Insufficient documentation

## 2019-10-22 DIAGNOSIS — M6281 Muscle weakness (generalized): Secondary | ICD-10-CM | POA: Insufficient documentation

## 2019-10-22 DIAGNOSIS — M25511 Pain in right shoulder: Secondary | ICD-10-CM | POA: Diagnosis not present

## 2019-10-22 DIAGNOSIS — R293 Abnormal posture: Secondary | ICD-10-CM

## 2019-10-22 DIAGNOSIS — R252 Cramp and spasm: Secondary | ICD-10-CM

## 2019-10-22 DIAGNOSIS — G8929 Other chronic pain: Secondary | ICD-10-CM | POA: Diagnosis not present

## 2019-10-22 DIAGNOSIS — M542 Cervicalgia: Secondary | ICD-10-CM | POA: Diagnosis not present

## 2019-10-22 NOTE — Therapy (Signed)
[facility] [address] [place], [place], [address] Phone: [phone]   Fax:  [phone]  Physical Therapy Treatment  Patient Details  Name: Rebecca Paul MRN: [id number] Date of Birth: 1955/11/16 Referring Provider (PT): Lillie Fragmin, MD   Encounter Date: 10/22/2019  PT End of Session - 10/22/19 1143    Visit Number  4    Date for PT Re-Evaluation  11/19/19    Authorization Type  BCBS    PT Start Time  1114   late   PT Stop Time  1144    PT Time Calculation (min)  30 min    Activity Tolerance  Patient tolerated treatment well    Behavior During Therapy  [facility] for tasks assessed/performed       Past Medical History:  Diagnosis Date  . Allergy    more sinus issues   . Anemia    yrs past as child , with pregnancy   . Arthritis    shoulders - not diagnosed   . Headache(784.0)   . Hyperlipidemia   . Hypertension   . IGT (impaired glucose tolerance)   . Kidney stone   . Osteopenia   . Plantar fasciitis     Past Surgical History:  Procedure Laterality Date  . ABDOMINAL HYSTERECTOMY     fibroids  . CESAREAN SECTION    . COLONOSCOPY  01/2018   normal  . OOPHORECTOMY     dermoid tumor  . TONSILLECTOMY AND ADENOIDECTOMY      There were no vitals filed for this visit.  Subjective Assessment - 10/22/19 1114    Subjective  I am using a TENs unit now and I am feeling much better.  I am 75-80% better.    Currently in Pain?  No/denies   up to 5/10 briefly   Pain Score  0-No pain    Pain Location  Neck    Pain Orientation  Right    Pain Descriptors / Indicators  Aching    Pain Type  Chronic pain    Pain Onset  More than a month ago    Pain Frequency  Intermittent    Aggravating Factors   turning head to the Rt, use of Rt UE    Pain Relieving Factors  TENs, heat                       [facility] Adult PT Treatment/Exercise - 10/22/19 0001      Exercises   Exercises  Shoulder       Shoulder Exercises: Supine   Horizontal ABduction  Strengthening;20 reps      Shoulder Exercises: ROM/Strengthening   UBE (Upper Arm Bike)  Level 1 x 4 minutes (3 forward/1 reverse)   fatigue with this.  PT monitored for posture     Manual Therapy   Manual Therapy  Soft tissue mobilization;Joint mobilization    Joint Mobilization  PA mobs C4-5    Soft tissue mobilization  trigger point release to Rt upper traps, passive sidebending for upper trap stretch      Neck Exercises: Stretches   Other Neck Stretches  cervical flexion stretch; cervical sidebending both ways; cervical rotation both ways to review HEP               PT Short Term Goals - 10/22/19 1118      PT SHORT TERM GOAL #1   Title  be independent in initial HEP    Status  Achieved      PT SHORT TERM GOAL #2   Title  sit with neutral seated postural to improve alignment and reduce strain with work tasks    Status  Achieved      PT SHORT TERM GOAL #3   Title  report a 30% reduction in neck/thoracic pain with sleep and with with use of Rt UE    Status  Achieved        PT Long Term Goals - 10/22/19 1119      PT LONG TERM GOAL #1   Title  be independent in advanced HEP    Time  8    Period  Weeks    Status  On-going      PT LONG TERM GOAL #3   Title  report a 70% reduction in neck and thoracic pain with work tasks    Time  8    Period  Weeks    Status  On-going            Plan - 10/22/19 1124    Clinical Impression Statement  Pt with lapse in treatment x 2 weeks.  Pt now has a home TENs for pain management and is using this at home regularly.  Pt demonstrated fatigue and scapular substitution with arm bike x 4 minutes today.  Pt requires verbal cues to reduce scapular elevation when fatigued with strength/endurance exercise.  Pt with continued, although improved from evaluation and pt demonstrated improved tissue mobility after manual therapy today.  Pt will continue to benefit from skilled PT to  address thoracic/UE endurance, improve cervical flexibility and reduce muscle pain and tension.    PT Frequency  2x / week    PT Duration  8 weeks    PT Treatment/Interventions  ADLs/Self Care Home Management;Cryotherapy;Electrical Stimulation;Ultrasound;Traction;Moist Heat;Therapeutic activities;Therapeutic exercise;Patient/family education;Neuromuscular re-education;Manual techniques;Passive range of motion;Dry needling;Taping;Joint Manipulations    PT Next Visit Plan  manual to address neck tension, UE strength/endurance    PT Home Exercise Plan  Access Code: [email or web address]    Consulted and Agree with Plan of Care  Patient       Patient will benefit from skilled therapeutic intervention in order to improve the following deficits and impairments:  Decreased activity tolerance, Decreased strength, Decreased endurance, Increased muscle spasms, Impaired flexibility, Postural dysfunction, Improper body mechanics, Pain  Visit Diagnosis: Cervicalgia  Pain in thoracic spine  Cramp and spasm  Abnormal posture     Problem List Patient Active Problem List   Diagnosis Date Noted  . Diabetes mellitus type II, uncontrolled ([place]) 04/09/2019  . BACK PAIN 07/06/2010  . Essential hypertension 04/22/2009  . Dyslipidemia 04/02/2008  . NEPHROLITHIASIS 10/30/2007    Sigurd Sos, PT 10/22/19 11:47 AM  [facility] [address] [place], [place], [address] Phone: [phone]   Fax:  [phone]  Name: ROBERTIA JARNAGIN MRN: [id number] Date of Birth: 1956/09/19

## 2019-10-29 ENCOUNTER — Ambulatory Visit: Payer: BC Managed Care – PPO

## 2019-10-29 ENCOUNTER — Encounter: Payer: Self-pay | Admitting: Family Medicine

## 2019-10-29 ENCOUNTER — Other Ambulatory Visit: Payer: Self-pay

## 2019-10-29 ENCOUNTER — Ambulatory Visit: Payer: BC Managed Care – PPO | Admitting: Family Medicine

## 2019-10-29 VITALS — BP 100/80 | HR 66 | Temp 98.2°F | Ht 61.0 in | Wt 155.1 lb

## 2019-10-29 DIAGNOSIS — M542 Cervicalgia: Secondary | ICD-10-CM | POA: Diagnosis not present

## 2019-10-29 DIAGNOSIS — M546 Pain in thoracic spine: Secondary | ICD-10-CM

## 2019-10-29 DIAGNOSIS — E785 Hyperlipidemia, unspecified: Secondary | ICD-10-CM | POA: Diagnosis not present

## 2019-10-29 DIAGNOSIS — M6281 Muscle weakness (generalized): Secondary | ICD-10-CM | POA: Diagnosis not present

## 2019-10-29 DIAGNOSIS — R252 Cramp and spasm: Secondary | ICD-10-CM | POA: Diagnosis not present

## 2019-10-29 DIAGNOSIS — M25511 Pain in right shoulder: Secondary | ICD-10-CM | POA: Diagnosis not present

## 2019-10-29 DIAGNOSIS — G8929 Other chronic pain: Secondary | ICD-10-CM | POA: Diagnosis not present

## 2019-10-29 DIAGNOSIS — I10 Essential (primary) hypertension: Secondary | ICD-10-CM

## 2019-10-29 DIAGNOSIS — Z20822 Contact with and (suspected) exposure to covid-19: Secondary | ICD-10-CM

## 2019-10-29 DIAGNOSIS — E1165 Type 2 diabetes mellitus with hyperglycemia: Secondary | ICD-10-CM | POA: Diagnosis not present

## 2019-10-29 DIAGNOSIS — E559 Vitamin D deficiency, unspecified: Secondary | ICD-10-CM | POA: Diagnosis not present

## 2019-10-29 DIAGNOSIS — R293 Abnormal posture: Secondary | ICD-10-CM | POA: Diagnosis not present

## 2019-10-29 LAB — COMPREHENSIVE METABOLIC PANEL
ALT: 15 U/L (ref 0–35)
AST: 14 U/L (ref 0–37)
Albumin: 4.3 g/dL (ref 3.5–5.2)
Alkaline Phosphatase: 50 U/L (ref 39–117)
BUN: 14 mg/dL (ref 6–23)
CO2: 27 mEq/L (ref 19–32)
Calcium: 10.9 mg/dL — ABNORMAL HIGH (ref 8.4–10.5)
Chloride: 103 mEq/L (ref 96–112)
Creatinine, Ser: 0.76 mg/dL (ref 0.40–1.20)
GFR: 93.15 mL/min (ref >60.00)
Glucose, Bld: 118 mg/dL — ABNORMAL HIGH (ref 70–99)
Potassium: 4.2 mEq/L (ref 3.5–5.1)
Sodium: 137 mEq/L (ref 135–145)
Total Bilirubin: 0.6 mg/dL (ref 0.2–1.2)
Total Protein: 7.9 g/dL (ref 6.0–8.3)

## 2019-10-29 LAB — HEMOGLOBIN A1C: Hgb A1c MFr Bld: 6.1 % (ref 4.6–6.5)

## 2019-10-29 LAB — VITAMIN D 25 HYDROXY (VIT D DEFICIENCY, FRACTURES): VITD: 15.71 ng/mL — ABNORMAL LOW (ref 30.00–100.00)

## 2019-10-29 NOTE — Therapy (Signed)
[facility] [address] [place], [place], [address] Phone: [phone]   Fax:  [phone]  Physical Therapy Treatment  Patient Details  Name: Rebecca Paul MRN: [id number] Date of Birth: 22-Aug-1956 Referring Provider (PT): Lillie Fragmin, MD   Encounter Date: 10/29/2019  PT End of Session - 10/29/19 1314    Visit Number  5    Date for PT Re-Evaluation  11/19/19    Authorization Type  BCBS    PT Start Time  1238    PT Stop Time  1322    PT Time Calculation (min)  44 min    Activity Tolerance  Patient tolerated treatment well    Behavior During Therapy  [facility] for tasks assessed/performed       Past Medical History:  Diagnosis Date  . Allergy    more sinus issues   . Anemia    yrs past as child , with pregnancy   . Arthritis    shoulders - not diagnosed   . Headache(784.0)   . Hyperlipidemia   . Hypertension   . IGT (impaired glucose tolerance)   . Kidney stone   . Osteopenia   . Plantar fasciitis     Past Surgical History:  Procedure Laterality Date  . ABDOMINAL HYSTERECTOMY     fibroids  . CESAREAN SECTION    . COLONOSCOPY  01/2018   normal  . OOPHORECTOMY     dermoid tumor  . TONSILLECTOMY AND ADENOIDECTOMY      There were no vitals filed for this visit.  Subjective Assessment - 10/29/19 1237    Subjective  I am tired today.  No pain right now.    Currently in Pain?  No/denies         [facility] PT Assessment - 10/29/19 0001      Assessment   Medical Diagnosis  upper back pain on the Rt side    Referring Provider (PT)  Lillie Fragmin, MD    Onset Date/Surgical Date  07/27/19    Hand Dominance  Right      Prior Function   Level of Independence  Independent    Vocation  Full time employment      Cognition   Overall Cognitive Status  Within Functional Limits for tasks assessed      Observation/Other Assessments   Focus on Therapeutic Outcomes (FOTO)   34% limitation      AROM    Overall AROM   Within functional limits for tasks performed    Overall AROM Comments  Pt with full cervical and A/ROM of the Rt shoulder with pain at end range on the Rt side of the neck with each motion and Rt shoulder at end range of each .      Strength   Overall Strength  Deficits    Overall Strength Comments  4+/5 except Rt shoulder flexion 4/5 with pain      Palpation   Palpation comment  trigger point over Rt upper traps, anterior and posterior glenohumeral joint on the Rt and cervical paraspinals Rt>Lt- significant improvement since evaluation                   [facility] Adult PT Treatment/Exercise - 10/29/19 0001      Moist Heat Therapy   Number Minutes Moist Heat  10 Minutes    Moist Heat Location  Cervical      Manual Therapy   Manual Therapy  Soft tissue mobilization;Joint mobilization  Soft tissue mobilization  trigger point release to Rt upper traps, passive sidebending for upper trap stretch               PT Short Term Goals - 10/22/19 1118      PT SHORT TERM GOAL #1   Title  be independent in initial HEP    Status  Achieved      PT SHORT TERM GOAL #2   Title  sit with neutral seated postural to improve alignment and reduce strain with work tasks    Status  Achieved      PT SHORT TERM GOAL #3   Title  report a 30% reduction in neck/thoracic pain with sleep and with with use of Rt UE    Status  Achieved        PT Long Term Goals - 10/29/19 1245      PT LONG TERM GOAL #1   Title  be independent in advanced HEP    Status  Achieved      PT LONG TERM GOAL #2   Title  reduce FOTO to < or = to 33% limitation    Baseline  34% limitation    Status  Partially Met      PT LONG TERM GOAL #3   Title  report a 70% reduction in neck and thoracic pain with work tasks    Status  Achieved      PT LONG TERM GOAL #4   Title  demonstrate 4+/5 to 5/5 UE strength to improve use of UEs with functional tasks and lifting overhead    Baseline  4+/5  except Rt shoulder flexion 4/5    Status  Partially Met            Plan - 10/29/19 1248    Clinical Impression Statement  Pt is ready to D/C to HEP and reports 75-80% overall improvement in symptoms since the start of care. FOTO is improved to 34% limitation (43% at evaluation).  Pt with weakness in Rt UE with pain upon testing.  Pt was encouraged to use her Rt UE more frequently with functional tasks.  Pt with mild tension in Rt upper traps and cervical musculature and this is improved significantly from evaluation.  Pt will continue with HEP and follow-up with MD as needed.    PT Home Exercise Plan  Access Code: [email or web address]       Patient will benefit from skilled therapeutic intervention in order to improve the following deficits and impairments:  Decreased activity tolerance, Decreased strength, Decreased endurance  Visit Diagnosis: Pain in thoracic spine  Cervicalgia  Cramp and spasm  Abnormal posture  Chronic right shoulder pain  Muscle weakness (generalized)     Problem List Patient Active Problem List   Diagnosis Date Noted  . Diabetes mellitus type II, uncontrolled ([place]) 04/09/2019  . BACK PAIN 07/06/2010  . Essential hypertension 04/22/2009  . Dyslipidemia 04/02/2008  . NEPHROLITHIASIS 10/30/2007   PHYSICAL THERAPY DISCHARGE SUMMARY  Visits from Start of Care: 5  Current functional level related to goals / functional outcomes: See above for current status.    Remaining deficits: Pt with Rt shoulder weakness and mild Rt upper trap tightness.     Education / Equipment: HEP Plan: Patient agrees to discharge.  Patient goals were met. Patient is being discharged due to meeting the stated rehab goals.  ?????          Sigurd Sos, PT 10/29/19 1:17 PM  [facility] [address], [place], [place], [address] Phone: [phone]   Fax:  [phone]  Name: Rebecca Paul MRN:  [id number] Date of Birth: 1956/07/01
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
## 2019-10-30 ENCOUNTER — Other Ambulatory Visit: Payer: Self-pay | Admitting: Family Medicine

## 2019-10-30 DIAGNOSIS — E559 Vitamin D deficiency, unspecified: Secondary | ICD-10-CM

## 2019-10-30 DIAGNOSIS — E213 Hyperparathyroidism, unspecified: Secondary | ICD-10-CM

## 2019-10-30 MED ORDER — VITAMIN D (ERGOCALCIFEROL) 1.25 MG (50000 UNIT) PO CAPS: 50000.0000 [IU] | ORAL_CAPSULE | ORAL | 1 refills | Status: DC

## 2019-10-31 LAB — PTH, INTACT AND CALCIUM
Calcium: 11 mg/dL — ABNORMAL HIGH (ref 8.6–10.4)
PTH: 86 pg/mL — ABNORMAL HIGH (ref 14–64)

## 2019-10-31 LAB — SAR COV2 SEROLOGY (COVID19)AB(IGG),IA: SARS CoV2 AB IGG: NEGATIVE

## 2019-10-31 LAB — MAGNESIUM, RBC: Magnesium RBC: 4.3 mg/dL (ref 4.0–6.4)

## 2019-11-15 ENCOUNTER — Other Ambulatory Visit: Payer: Self-pay

## 2019-11-19 ENCOUNTER — Encounter: Payer: Self-pay | Admitting: Internal Medicine

## 2019-11-19 ENCOUNTER — Ambulatory Visit: Payer: BC Managed Care – PPO | Admitting: Internal Medicine

## 2019-11-19 ENCOUNTER — Other Ambulatory Visit: Payer: Self-pay

## 2019-11-19 DIAGNOSIS — E559 Vitamin D deficiency, unspecified: Secondary | ICD-10-CM | POA: Diagnosis not present

## 2019-11-19 DIAGNOSIS — E213 Hyperparathyroidism, unspecified: Secondary | ICD-10-CM | POA: Diagnosis not present

## 2019-11-19 MED ORDER — LOSARTAN POTASSIUM 100 MG PO TABS: 100.0000 mg | ORAL_TABLET | Freq: Every day | ORAL | 3 refills | Status: DC

## 2019-11-19 NOTE — Patient Instructions (Signed)
-   Please stay hydrated  - Avoid over the counter calcium tablets - Consume 2-3 servings of calcium in your diet  - Continue Vitamin D 50,000 weekly , once you are done with that prescription, start over the counter vitamin D3 at 1000 iu daily  -  Stop Losartan-HCTZ and start losartan alone, keep a check on your blood pressure at home, if  Blood pressure is over 150/90 mmhg, please contact your PCP.    FOODS THAT CONTAIN CALCIUM  Calcium can be found in many foods, not only in dairy products.  Dairy Foods  Yogurt (1 cup) 350 mg  Milk (1 cup) 300 mg  Cheddar cheese (1 oz.) 204 mg  Ricotta cheese, part skim (1/4 cup) 169 mg  Cottage cheese (1 cup) 150 mg  Nondairy Foods  Whole Grain Total cereal (3/4 cup) 1000 mg  Pink salmon with bones, sardines (3 oz., cooked) 181 mg  Black beans (1 cup) 103 mg  Broccoli (1 cup, cooked) 150 mg  Almonds (1 tbsp.) 50 mg  Soy Products  Soy yogurt with calcium (3/4 cup) 300 mg  Soy milk enriched with calcium (1 cup) 300 mg  Tofu, firm or extra firm (1/4 cup) 250 mg  Soy nuts, roasted/salted (1/2 cup) 103 mg

## 2019-11-19 NOTE — Progress Notes (Signed)
Name: Rebecca Paul  MRN/ DOB: [id number], 06/15/1956    Age/ Sex: 64 y.o., female    PCP: Caren Macadam, MD   Reason for Endocrinology Evaluation: Hypercalcemia     Date of Initial Endocrinology Evaluation: 11/19/2019     HPI: Rebecca Paul is a 64 y.o. female with a past medical history of HTN and Dyslipidemia. The patient presented for initial endocrinology clinic visit on 11/19/2019 for consultative assistance with her Hypercalcemia.   Rebecca Paul indicates that she was first diagnosed with hypercalcemia in 2019 . Since that time, she has not experienced symptoms of constipation, or  polydipsia, has polyuria that she attributes to HCTZ.  She denies generalized weakness, diffuse muscle pains, significant memory impairment. She denies use of over the counter calcium , or  lithium She is on  HCTZ Started vitamin D supplements recently 50,000 weekly   She has history of kidney stones, but no kidney disease, liver disease, granulomatous disease. She denies osteoporosis or prior fractures. Daily dietary calcium intake:1-2 servings. Mother with osteoporosis but no parathyroid disease, thyroid disease.      HISTORY:  Past Medical History:  Past Medical History:  Diagnosis Date  . Allergy    more sinus issues   . Anemia    yrs past as child , with pregnancy   . Arthritis    shoulders - not diagnosed   . Headache(784.0)   . Hyperlipidemia   . Hypertension   . IGT (impaired glucose tolerance)   . Kidney stone   . Osteopenia   . Plantar fasciitis    Past Surgical History:  Past Surgical History:  Procedure Laterality Date  . ABDOMINAL HYSTERECTOMY     fibroids  . CESAREAN SECTION    . COLONOSCOPY  01/2018   normal  . OOPHORECTOMY     dermoid tumor  . TONSILLECTOMY AND ADENOIDECTOMY        Social History:  reports that she has never smoked. She has never used smokeless tobacco. She reports current alcohol use. She reports that she does not use drugs.  Family  History: family history includes Cancer in her father and another family member; Diabetes in her maternal grandmother, mother, sister, and another family member; Heart attack in her sister; Hyperlipidemia in her mother and another family member; Hypertension in her mother and another family member; Kidney disease in an other family member; Obesity in her sister and sister; Thyroid disease in her mother.   HOME MEDICATIONS: Allergies as of 11/19/2019   No Known Allergies     Medication List       Accurate as of November 19, 2019  3:29 PM. If you have any questions, ask your nurse or doctor.        STOP taking these medications   cyclobenzaprine 5 MG tablet Commonly known as: FLEXERIL Stopped by: Dorita Sciara, MD     TAKE these medications   blood glucose meter kit and supplies Kit Dispense based on patient and insurance preference. Use up to four times daily as directed. (FOR ICD-9 250.00, 250.01).   cholecalciferol 1000 units tablet Commonly known as: VITAMIN D Take 1,000 Units by mouth daily.   Contour Next Test test strip Generic drug: glucose blood Use as instructed   ELDERBERRY PO Take by mouth.   famciclovir 250 MG tablet Commonly known as: FAMVIR Take 1 tablet (250 mg total) by mouth 2 (two) times daily as needed.   fluticasone 50 MCG/ACT nasal spray Commonly  known as: FLONASE Place 2 sprays into both nostrils daily.   losartan-hydrochlorothiazide 100-12.5 MG tablet Commonly known as: HYZAAR TAKE 1 TABLET BY MOUTH EVERY DAY   pravastatin 40 MG tablet Commonly known as: PRAVACHOL Take 1 tablet (40 mg total) by mouth daily.   vitamin C 500 MG tablet Commonly known as: ASCORBIC ACID Take 500 mg by mouth daily.   Vitamin D (Ergocalciferol) 1.25 MG (50000 UNIT) Caps capsule Commonly known as: DRISDOL Take 1 capsule (50,000 Units total) by mouth every 7 (seven) days.   vitamin E 45 MG (100 UNITS) capsule Take by mouth daily.         REVIEW OF  SYSTEMS: A comprehensive ROS was conducted with the patient and is negative except as per HPI and below:  ROS     OBJECTIVE:  VS: BP 120/70   Pulse 60   Temp 98.3 F (36.8 C) (Oral)   Ht 5' 1"  (1.549 m)   Wt 156 lb (70.8 kg)   SpO2 95%   BMI 29.48 kg/m    Wt Readings from Last 3 Encounters:  11/19/19 156 lb (70.8 kg)  10/29/19 155 lb 1.6 oz (70.4 kg)  08/06/19 152 lb 12.8 oz (69.3 kg)     EXAM: General: Pt appears well and is in NAD  Neck: General: Supple without adenopathy. Thyroid: Thyroid size normal.  No goiter or nodules appreciated. No thyroid bruit.  Lungs: Clear with good BS bilat with no rales, rhonchi, or wheezes  Heart: Auscultation: RRR.  Abdomen: Normoactive bowel sounds, soft, nontender, without masses or organomegaly palpable  Extremities:  BL LE: No pretibial edema normal ROM and strength.  Skin: Hair: Texture and amount normal with gender appropriate distribution Skin Inspection: No rashes, acanthosis nigricans/skin tags. No lipohypertrophy Skin Palpation: Skin temperature, texture, and thickness normal to palpation  Neuro: Cranial nerves: II - XII grossly intact  Motor: Normal strength throughout DTRs: 2+ and symmetric in UE without delay in relaxation phase  Mental Status: Judgment, insight: Intact Orientation: Oriented to time, place, and person Mood and affect: No depression, anxiety, or agitation     DATA REVIEWED:   DXA 04/17/2018 AP spine    T score -0.8 Left femoral neck -2.0  Right femoral neck -1.8    ASSESSMENT/PLAN/RECOMMENDATIONS:   1. Hypercalcemia :   We discussed differential of familial hypercalcinuric hypercalcemia ([place]) vs Primary Hyperparathyroidism (pHPT) . Part of her current elevation of PTH is vitamin D deficiency - It is important to differentiate between the two, as [place] does not cause any organ damage and does not require further follow up , on the other hand pHPT could cause end organ damage and would require further  evaluation.  -The next step would be a 24-hour urine collection, but this will have to be postponed until her vitamin D has been replenished. -I am also going to discontinue her HCTZ, as it will increase serum calcium levels in people with hyperparathyroidism, it will also falsely lower her calcium excretion in the urine.  She may be restarted on it if needed once the 24-hour collection has been complete.    Recommendations: - Maintain hydration - Avoid over-the-counter calcium tablets -Maintain 2-3 servings of  calcium in the diet   2.  Vitamin D deficiency:   -She will continue ergocalciferol 50,000 IU weekly, she was advised that after this prescription is complete to start OTC vitamin D3 1000 IU daily   F/U in 3 months   Signed electronically by: Mack Guise,  MD  [facility]  [facility] [address] [place], [place] [address] Phone: [phone] FAX: [phone]   CC: Caren Macadam, [place] [address] Phone: [phone] Fax: [phone]   Return to Endocrinology clinic as below: No future appointments.

## 2019-11-20 ENCOUNTER — Encounter: Payer: Self-pay | Admitting: Internal Medicine

## 2019-11-20 DIAGNOSIS — E559 Vitamin D deficiency, unspecified: Secondary | ICD-10-CM | POA: Insufficient documentation

## 2019-11-20 DIAGNOSIS — E213 Hyperparathyroidism, unspecified: Secondary | ICD-10-CM | POA: Insufficient documentation

## 2019-11-26 ENCOUNTER — Telehealth: Payer: Self-pay | Admitting: Family Medicine

## 2019-11-26 ENCOUNTER — Telehealth: Payer: Self-pay | Admitting: Internal Medicine

## 2019-11-26 NOTE — Telephone Encounter (Signed)
Looks like refill was ordered 11/19/2019 but I do not see instructions to continue that medication in last OV, please advise.

## 2019-11-26 NOTE — Telephone Encounter (Signed)
Spoke to pt and she stated that she is concerned because her blood pressure readings have been slightly elevated and she has been having more frequent headaches which she stated happened before when she was taken off of HCTZ. I explained to her why you stopped the HCTZ and she stated that she understood. Please advise.

## 2019-11-26 NOTE — Telephone Encounter (Signed)
Patient called wondering why she started taking the cozaar - she said she explained the dosage that she was taking previously was giving her headaches and then she went to a new dosage and now she's back at the 100 mg which is causing headaches and she does not want to continue taking it. Patient would like to be reached back at ph# [phone].

## 2019-11-26 NOTE — Telephone Encounter (Signed)
Pt needs to change the Losartan medication that [facility] from endocrinology put her on.  She said her BP has been high since taking it and would like an alternative. She said that the notes from [facility] should be in her chart as to what she did. She took her of the HCTZ due to her calcium levels so she can monitor it and put her on regular Losartan and that's when the BP has been high.   Pharmacy:  [facility] FAX: [phone]   Pt can be reached at [phone]   She would like to still be reached out regarding [place] notes

## 2019-11-26 NOTE — Telephone Encounter (Signed)
Pt informed

## 2019-11-26 NOTE — Telephone Encounter (Signed)
na

## 2019-11-28 ENCOUNTER — Other Ambulatory Visit: Payer: Self-pay | Admitting: Family Medicine

## 2019-11-28 MED ORDER — AMLODIPINE BESYLATE 2.5 MG PO TABS: 2.5000 mg | ORAL_TABLET | Freq: Every day | ORAL | 1 refills | Status: DC

## 2019-11-28 NOTE — Telephone Encounter (Signed)
Per Dr. Hollie Salk notes; once she completes the 24 hour urine study she can restart the hctz. I'm not sure when that evaluation is happening, but just noting this.   I would not suggest switching out the losartan, because this helps to protect the kidneys and is probably helping with blood pressure, but we could add something else if needed.   Could she let us know specific blood pressures she is getting and range? We could add amlodipine if needed, but I would like to see where she is at with numbers currently.

## 2019-11-28 NOTE — Telephone Encounter (Signed)
Rebecca Paul - please check in with her on Friday to see how pressures are doing/whether she started amlodipine.    I spoke with patient. She is going to check pressures today, tomorrow. She will add amlodipine if running 140+with readings. Discussed new medication(s) today with patient. Discussed potential side effects and patient verbalized understanding. Continue losartan. Currently feeling fine. Most recent bp 133/76.

## 2019-11-28 NOTE — Telephone Encounter (Signed)
I called the pt and informed her of the message below.  Patient stated the message below is not correct as she is clear on what Dr Kelton Pillar told her which was to call her PCP if her BP was over 150/90 and she was not asking for a different medication.  Patient stated she has had increased headaches which is the same problem she was having when she was seen by Dr Burnice Logan.  BP readings are as follows: 172/79, 146/83, 131/73, 172/79, 146/85, 150/76, 149/76, 150/77, 128/86, 144/88, 129/72, 121/72 as of yesterday.  Message sent to PCP to call the pt.

## 2019-11-30 NOTE — Telephone Encounter (Signed)
I called the pt and she stated she has not taken her BP today, yesterdays reading was 130/80 and has not started Amlodipine and wanted to know what numbers she should be looking for?  Message sent to PCP.

## 2019-11-30 NOTE — Telephone Encounter (Signed)
I would like for pressures to be less than [id number] systolic. If she finds she is regularly above or at this number, then adding the amlodipine. She is welcome to check daily through the weekend and send me an update next week with numbers.

## 2019-11-30 NOTE — Telephone Encounter (Signed)
Patient informed of the message below.

## 2019-12-04 ENCOUNTER — Telehealth: Payer: Self-pay | Admitting: Family Medicine

## 2019-12-04 NOTE — Telephone Encounter (Signed)
I called [facility] and spoke with [facility].  Varney Biles stated she is not sure who spoke with the patient and if there was a miscommunication as the pt will need to sign a medical records release for PT notes.  Varney Biles stated she will contact the pt and inform her of this.

## 2019-12-04 NOTE — Telephone Encounter (Signed)
Pt stated that she had already called them and they told her that they sent it to [facility] and to retrieve those notes from her. Pt is wondering if it is accessible through mychart since it was done through [facility]?   Pt can be reached at [phone] she said she will be by her phone.

## 2019-12-04 NOTE — Telephone Encounter (Signed)
Pt needs her discharge notes for Physical Therapy in order to give it to insurance so they will cover it.   Pt can be reached at [phone]

## 2019-12-04 NOTE — Telephone Encounter (Signed)
I left a detailed message at the pts cell number stating she should call the location where she had PT for  discharge notes as we do not have notes here.

## 2019-12-18 ENCOUNTER — Telehealth: Payer: Self-pay | Admitting: Family Medicine

## 2019-12-18 NOTE — Telephone Encounter (Signed)
Pt is requesting a call in regards to her blood pressure and blood pressure medication. She would like a appt soon but declined the next available which was virtual.

## 2019-12-19 NOTE — Telephone Encounter (Signed)
I called the pt and offered the next available opening for Dr Ethlyn Gallery which is on 3/19.  Patient was scheduled for 3/8 with Dr Elease Hashimoto as she requested an earlier appt.

## 2019-12-19 NOTE — Telephone Encounter (Signed)
Ok to put her in a virtual slot for in office if that is what she prefers?

## 2019-12-20 ENCOUNTER — Ambulatory Visit: Payer: BC Managed Care – PPO | Attending: Family

## 2019-12-20 DIAGNOSIS — Z23 Encounter for immunization: Secondary | ICD-10-CM | POA: Insufficient documentation

## 2019-12-20 NOTE — Progress Notes (Signed)
   Covid-19 Vaccination Clinic  Name:  Rebecca Paul    MRN: [id number] DOB: May 23, 1956  12/20/2019  [name] was observed post Covid-19 immunization for 15 minutes without incident. She was provided with Vaccine Information Sheet and instruction to access the V-Safe system.   [name] was instructed to call 911 with any severe reactions post vaccine: Marland Kitchen Difficulty breathing  . Swelling of face and throat  . A fast heartbeat  . A bad rash all over body  . Dizziness and weakness   Immunizations Administered    Name Date Dose VIS Date Route   Moderna COVID-19 Vaccine 12/20/2019  3:28 PM 0.5 mL 09/18/2019 Intramuscular   Manufacturer: Moderna   Lot: [id number]   [id number]

## 2019-12-24 ENCOUNTER — Encounter: Payer: Self-pay | Admitting: Family Medicine

## 2019-12-24 ENCOUNTER — Other Ambulatory Visit: Payer: Self-pay

## 2019-12-24 ENCOUNTER — Ambulatory Visit: Payer: BC Managed Care – PPO | Admitting: Family Medicine

## 2019-12-24 ENCOUNTER — Telehealth: Payer: Self-pay | Admitting: Family Medicine

## 2019-12-24 VITALS — BP 142/82 | HR 67 | Temp 97.9°F | Ht 61.0 in | Wt 160.3 lb

## 2019-12-24 DIAGNOSIS — I10 Essential (primary) hypertension: Secondary | ICD-10-CM | POA: Diagnosis not present

## 2019-12-24 MED ORDER — AMLODIPINE BESYLATE 5 MG PO TABS: 5.0000 mg | ORAL_TABLET | Freq: Every day | ORAL | 3 refills | Status: DC

## 2019-12-24 NOTE — Progress Notes (Signed)
Subjective:     Patient ID: Rebecca Paul, female   DOB: 1956-04-18, 64 y.o.   MRN: [id number]  HPI Ms. Salatino seen for follow-up regarding hypertension.  Recent history is that she had elevated calcium around 11.  This is been mildly elevated in the past and possibly related to HCTZ therapy.  She was referred to endocrinologist.  They discontinued her HCTZ.  She remains on losartan 100 mg daily and was placed on amlodipine 2.5 mg daily on 23 February.  She has had some sporadically elevated blood pressure since then.  She has scheduled follow-up with endocrinology.  She had elevated PTH level of 86.  Past Medical History:  Diagnosis Date  . Allergy    more sinus issues   . Anemia    yrs past as child , with pregnancy   . Arthritis    shoulders - not diagnosed   . Headache(784.0)   . Hyperlipidemia   . Hypertension   . IGT (impaired glucose tolerance)   . Kidney stone   . Osteopenia   . Plantar fasciitis    Past Surgical History:  Procedure Laterality Date  . ABDOMINAL HYSTERECTOMY     fibroids  . CESAREAN SECTION    . COLONOSCOPY  01/2018   normal  . OOPHORECTOMY     dermoid tumor  . TONSILLECTOMY AND ADENOIDECTOMY      reports that she has never smoked. She has never used smokeless tobacco. She reports current alcohol use. She reports that she does not use drugs. family history includes Cancer in her father and another family member; Diabetes in her maternal grandmother, mother, sister, and another family member; Heart attack in her sister; Hyperlipidemia in her mother and another family member; Hypertension in her mother and another family member; Kidney disease in an other family member; Obesity in her sister and sister; Thyroid disease in her mother. No Known Allergies   Review of Systems  Constitutional: Negative for fatigue and unexpected weight change.  Eyes: Negative for visual disturbance.  Respiratory: Negative for cough, chest tightness, shortness of breath and  wheezing.   Cardiovascular: Negative for chest pain, palpitations and leg swelling.  Endocrine: Negative for polydipsia and polyuria.  Neurological: Negative for dizziness, seizures, syncope, weakness, light-headedness and headaches.       Objective:   Physical Exam Constitutional:      Appearance: She is well-developed.  Eyes:     Pupils: Pupils are equal, round, and reactive to light.  Neck:     Thyroid: No thyromegaly.     Vascular: No JVD.  Cardiovascular:     Rate and Rhythm: Normal rate and regular rhythm.     Heart sounds: No gallop.   Pulmonary:     Effort: Pulmonary effort is normal. No respiratory distress.     Breath sounds: Normal breath sounds. No wheezing or rales.  Musculoskeletal:     Cervical back: Neck supple.     Right lower leg: No edema.     Left lower leg: No edema.  Neurological:     Mental Status: She is alert.        Assessment:     #1 hypertension.  Repeat reading by me today 142/82.  She has had some sporadic elevations at home.  Recent discontinuation of HCTZ  #2 hypercalcemia with probable primary hyperparathyroidism    Plan:     -Increase amlodipine to 5 mg daily and continue monitoring.  We recommended follow-up with primary within a few months to  reassess -Continue close follow-up with endocrinology  Eulas Post MD [facility] at [facility]

## 2019-12-24 NOTE — Telephone Encounter (Signed)
I am happy to order fasting labs, but I see that she has an appointment with endo in May (right after our next appointment) and I'm sure they will do some bloodwork. Would she like to postpone our labwork (because her cholesterol and blood sugar have been well controlled and Dr. Kelton Pillar could potentially add these items to her bloodwork) and our visit until after her endocrinology visit? There is nothing urgent that I feel we need to recheck before that may appointment.   If she still prefers to repeat bloodwork with me then just let me know and I will order.

## 2019-12-24 NOTE — Telephone Encounter (Signed)
Patient wants to make sure that she gets her fasting labs and a1c that Dr. Ethlyn Gallery stated she would be getting this time.   Patient is requesting a call back- [phone]

## 2019-12-25 ENCOUNTER — Other Ambulatory Visit: Payer: Self-pay | Admitting: Family Medicine

## 2019-12-25 DIAGNOSIS — E559 Vitamin D deficiency, unspecified: Secondary | ICD-10-CM

## 2019-12-25 DIAGNOSIS — E785 Hyperlipidemia, unspecified: Secondary | ICD-10-CM

## 2019-12-25 DIAGNOSIS — E1165 Type 2 diabetes mellitus with hyperglycemia: Secondary | ICD-10-CM

## 2019-12-25 DIAGNOSIS — I10 Essential (primary) hypertension: Secondary | ICD-10-CM

## 2019-12-25 NOTE — Telephone Encounter (Signed)
Spoke with the pt and offered to schedule a lab appt.  Patient stated someone must have misunderstood her message as she would like to have labs done at the visit on 4/28.

## 2019-12-25 NOTE — Telephone Encounter (Signed)
Labs ordered.

## 2019-12-25 NOTE — Telephone Encounter (Signed)
Spoke with the pt and informed her of the message below.  Patient stated she would like to have repeat labs done and message forwarded to PCP.

## 2019-12-31 ENCOUNTER — Ambulatory Visit: Payer: BC Managed Care – PPO | Admitting: Family Medicine

## 2020-01-03 ENCOUNTER — Other Ambulatory Visit: Payer: Self-pay

## 2020-01-04 ENCOUNTER — Ambulatory Visit: Payer: BC Managed Care – PPO | Admitting: Family Medicine

## 2020-01-04 ENCOUNTER — Encounter: Payer: Self-pay | Admitting: Family Medicine

## 2020-01-04 VITALS — BP 124/74 | HR 60 | Temp 98.3°F | Ht 61.0 in | Wt 157.3 lb

## 2020-01-04 DIAGNOSIS — M545 Low back pain, unspecified: Secondary | ICD-10-CM

## 2020-01-04 DIAGNOSIS — R350 Frequency of micturition: Secondary | ICD-10-CM | POA: Diagnosis not present

## 2020-01-04 LAB — POCT URINALYSIS DIPSTICK
Appearance: NEGATIVE
Bilirubin, UA: NEGATIVE
Blood, UA: NEGATIVE
Glucose, UA: NEGATIVE
Ketones, UA: NEGATIVE
Leukocytes, UA: NEGATIVE
Nitrite, UA: NEGATIVE
Protein, UA: NEGATIVE
Spec Grav, UA: 1.025 (ref 1.010–1.025)
Urobilinogen, UA: 0.2 E.U./dL
pH, UA: 6 (ref 5.0–8.0)

## 2020-01-04 NOTE — Patient Instructions (Signed)
Your urine looks clear.    Suspect low back pain is musculoskeletal.  Continue with Tylenol as needed.    Your BP is much better!

## 2020-01-04 NOTE — Progress Notes (Signed)
Subjective:     Patient ID: Rebecca Paul, female   DOB: [birth year], 64 y.o.   MRN: [id number]  HPI [name] set up follow-up because of some recent frequent urination and low back pain mostly left lower lumbar area.  She states she had remote history of kidney stones but this was somewhat different.  She has frequent urination but no burning.  Her low back pain is left lower lumbar without radiculitis symptoms.  No numbness or weakness.  She took some Tylenol with mild improvement.  She has hypertension.  Had recently been taken off of HCTZ.  Remains on losartan.  We increased her amlodipine to 5 mg daily and blood pressures have been improved since then.  She has had mild hypercalcemia with question of primary hyperparathyroidism.  Endocrinology is waiting to recheck her calcium off HCTZ to assess stability.  Past Medical History:  Diagnosis Date  . Allergy    more sinus issues   . Anemia    yrs past as child , with pregnancy   . Arthritis    shoulders - not diagnosed   . Headache(784.0)   . Hyperlipidemia   . Hypertension   . IGT (impaired glucose tolerance)   . Kidney stone   . Osteopenia   . Plantar fasciitis    Past Surgical History:  Procedure Laterality Date  . ABDOMINAL HYSTERECTOMY     fibroids  . CESAREAN SECTION    . COLONOSCOPY  01/2018   normal  . OOPHORECTOMY     dermoid tumor  . TONSILLECTOMY AND ADENOIDECTOMY      reports that she has never smoked. She has never used smokeless tobacco. She reports current alcohol use. She reports that she does not use drugs. family history includes Cancer in her father and another family member; Diabetes in her maternal grandmother, mother, sister, and another family member; Heart attack in her sister; Hyperlipidemia in her mother and another family member; Hypertension in her mother and another family member; Kidney disease in an other family member; Obesity in her sister and sister; Thyroid disease in her mother. No Known  Allergies   Review of Systems  Constitutional: Negative for chills, fatigue and fever.  Eyes: Negative for visual disturbance.  Respiratory: Negative for cough, chest tightness, shortness of breath and wheezing.   Cardiovascular: Negative for chest pain, palpitations and leg swelling.  Genitourinary: Positive for frequency.  Musculoskeletal: Positive for back pain.  Neurological: Negative for dizziness, seizures, syncope, weakness, light-headedness, numbness and headaches.       Objective:   Physical Exam Constitutional:      Appearance: She is well-developed.  Eyes:     Pupils: Pupils are equal, round, and reactive to light.  Neck:     Thyroid: No thyromegaly.     Vascular: No JVD.  Cardiovascular:     Rate and Rhythm: Normal rate and regular rhythm.     Heart sounds: No gallop.   Pulmonary:     Effort: Pulmonary effort is normal. No respiratory distress.     Breath sounds: Normal breath sounds. No wheezing or rales.  Musculoskeletal:     Cervical back: Neck supple.     Right lower leg: No edema.     Left lower leg: No edema.     Comments: Straight leg raises are negative  Neurological:     General: No focal deficit present.     Mental Status: She is alert.     Deep Tendon Reflexes: Reflexes normal.  Assessment:     #1 urinary frequency.  Urine dipstick is unremarkable.  No evidence for UTI.  No hematuria.  #2 hypertension improved with recent increased dosage of amlodipine    Plan:     -Reassurance regarding urine -Continue to monitor blood pressure and be in touch if consistently greater than 140/90.  She seems to have improved with bumping up her amlodipine  Eulas Post MD [facility] at [facility]

## 2020-01-22 ENCOUNTER — Ambulatory Visit: Payer: BC Managed Care – PPO | Attending: Family

## 2020-01-22 DIAGNOSIS — Z23 Encounter for immunization: Secondary | ICD-10-CM

## 2020-01-22 NOTE — Progress Notes (Signed)
   Covid-19 Vaccination Clinic  Name:  Rebecca Paul    MRN: [id number] DOB: 05-07-1956  01/22/2020  [name] was observed post Covid-19 immunization for 15 minutes without incident. She was provided with Vaccine Information Sheet and instruction to access the V-Safe system.   [name] was instructed to call 911 with any severe reactions post vaccine: Marland Kitchen Difficulty breathing  . Swelling of face and throat  . A fast heartbeat  . A bad rash all over body  . Dizziness and weakness   Immunizations Administered    Name Date Dose VIS Date Route   Moderna COVID-19 Vaccine 01/22/2020  3:03 PM 0.5 mL 09/18/2019 Intramuscular   Manufacturer: Moderna   Lot: [id number]   [id number]

## 2020-01-28 ENCOUNTER — Other Ambulatory Visit: Payer: Self-pay | Admitting: Family Medicine

## 2020-01-28 ENCOUNTER — Telehealth: Payer: Self-pay | Admitting: Internal Medicine

## 2020-01-28 NOTE — Telephone Encounter (Signed)
Pt is asking for clarity on taking the Vitamin D 50,000 once weekly. The rx has 2 refills on it and she has refilled it.  She was told, however, that once she is out of the Vit D 50,000 she was to start Vitamin D3 1000 daily. Was she to start the D3 after the 1st rx for Vit D 50,000 or after all the refills are gone?

## 2020-01-28 NOTE — Telephone Encounter (Signed)
Spoke to pt and went over instructions on AVS from her last visit. Pt stated that she understood.

## 2020-02-12 ENCOUNTER — Telehealth: Payer: Self-pay | Admitting: Family Medicine

## 2020-02-12 NOTE — Telephone Encounter (Signed)
Pt states she has an appt for Endocrinology in May, She wants to know whether or not that endocrinologsit can do her A1C check and blood work instead of [facility]? She does not want to pay two copay's.   Pt can be reached at [phone]

## 2020-02-12 NOTE — Telephone Encounter (Signed)
I would encourage her to check with their office?  Endocrinology will likely recheck specialty labs related to the parathyroid, but might be able to add on the blood work that I have ordered as well (some offices will not draw blood work that their providers have not ordered, but I am not sure how their office works).  They should be able to see my blood work orders in the system though.  She can let us know if they need [place] to print a lab order in order for them to do the blood work I have requested.

## 2020-02-13 ENCOUNTER — Ambulatory Visit: Payer: BC Managed Care – PPO | Admitting: Family Medicine

## 2020-02-13 NOTE — Telephone Encounter (Signed)
Spoke with the pt and informed her of the message below.   

## 2020-02-18 ENCOUNTER — Telehealth: Payer: Self-pay | Admitting: Internal Medicine

## 2020-02-18 ENCOUNTER — Ambulatory Visit: Payer: BC Managed Care – PPO | Admitting: Internal Medicine

## 2020-02-18 NOTE — Telephone Encounter (Signed)
Patient called wondering if she could get her blood work here when she comes for her apt on 02/25/2020 for her other Dr. Requests Dr Kelton Pillar to give her a call at ph# [phone]

## 2020-02-18 NOTE — Telephone Encounter (Signed)
Spoke to pt and added her to lab schedule for tomorrow. PCP has already placed orders and is within [employer]. Pt has appt for 02/25/20 with you.

## 2020-02-19 ENCOUNTER — Other Ambulatory Visit: Payer: BC Managed Care – PPO

## 2020-02-19 ENCOUNTER — Other Ambulatory Visit: Payer: Self-pay

## 2020-02-19 ENCOUNTER — Other Ambulatory Visit (INDEPENDENT_AMBULATORY_CARE_PROVIDER_SITE_OTHER): Payer: BC Managed Care – PPO

## 2020-02-19 DIAGNOSIS — E559 Vitamin D deficiency, unspecified: Secondary | ICD-10-CM

## 2020-02-19 DIAGNOSIS — E785 Hyperlipidemia, unspecified: Secondary | ICD-10-CM

## 2020-02-19 DIAGNOSIS — I10 Essential (primary) hypertension: Secondary | ICD-10-CM

## 2020-02-19 DIAGNOSIS — E1165 Type 2 diabetes mellitus with hyperglycemia: Secondary | ICD-10-CM

## 2020-02-19 LAB — COMPREHENSIVE METABOLIC PANEL
ALT: 15 U/L (ref 0–35)
AST: 17 U/L (ref 0–37)
Albumin: 4.3 g/dL (ref 3.5–5.2)
Alkaline Phosphatase: 51 U/L (ref 39–117)
BUN: 13 mg/dL (ref 6–23)
CO2: 28 mEq/L (ref 19–32)
Calcium: 10.9 mg/dL — ABNORMAL HIGH (ref 8.4–10.5)
Chloride: 103 mEq/L (ref 96–112)
Creatinine, Ser: 0.83 mg/dL (ref 0.40–1.20)
GFR: 84.06 mL/min (ref >60.00)
Glucose, Bld: 133 mg/dL — ABNORMAL HIGH (ref 70–99)
Potassium: 4 mEq/L (ref 3.5–5.1)
Sodium: 135 mEq/L (ref 135–145)
Total Bilirubin: 0.6 mg/dL (ref 0.2–1.2)
Total Protein: 7.9 g/dL (ref 6.0–8.3)

## 2020-02-19 LAB — LIPID PANEL
Cholesterol: 223 mg/dL — ABNORMAL HIGH (ref 0–200)
HDL: 49.6 mg/dL (ref >39.00)
LDL Cholesterol: 156 mg/dL — ABNORMAL HIGH (ref 0–99)
NonHDL: 173.76
Total CHOL/HDL Ratio: 5
Triglycerides: 87 mg/dL (ref 0.0–149.0)
VLDL: 17.4 mg/dL (ref 0.0–40.0)

## 2020-02-19 LAB — CBC WITH DIFFERENTIAL/PLATELET
Basophils Absolute: 0 10*3/uL (ref 0.0–0.1)
Basophils Relative: 0.3 % (ref 0.0–3.0)
Eosinophils Absolute: 0 10*3/uL (ref 0.0–0.7)
Eosinophils Relative: 0.6 % (ref 0.0–5.0)
HCT: 37.1 % (ref 36.0–46.0)
Hemoglobin: 12.9 g/dL (ref 12.0–15.0)
Lymphocytes Relative: 34.1 % (ref 12.0–46.0)
Lymphs Abs: 2 10*3/uL (ref 0.7–4.0)
MCHC: 34.8 g/dL (ref 30.0–36.0)
MCV: 80.5 fl (ref 78.0–100.0)
Monocytes Absolute: 0.5 10*3/uL (ref 0.1–1.0)
Monocytes Relative: 7.8 % (ref 3.0–12.0)
Neutro Abs: 3.4 10*3/uL (ref 1.4–7.7)
Neutrophils Relative %: 57.2 % (ref 43.0–77.0)
Platelets: 246 10*3/uL (ref 150.0–400.0)
RBC: 4.61 Mil/uL (ref 3.87–5.11)
RDW: 13.9 % (ref 11.5–15.5)
WBC: 6 10*3/uL (ref 4.0–10.5)

## 2020-02-19 LAB — HEMOGLOBIN A1C: Hgb A1c MFr Bld: 6.1 % (ref 4.6–6.5)

## 2020-02-19 LAB — VITAMIN D 25 HYDROXY (VIT D DEFICIENCY, FRACTURES): VITD: 41.44 ng/mL (ref 30.00–100.00)

## 2020-02-20 LAB — PARATHYROID HORMONE, INTACT (NO CA): PTH: 70 pg/mL — ABNORMAL HIGH (ref 14–64)

## 2020-02-25 ENCOUNTER — Ambulatory Visit: Payer: BC Managed Care – PPO | Admitting: Internal Medicine

## 2020-02-25 ENCOUNTER — Encounter: Payer: Self-pay | Admitting: Internal Medicine

## 2020-02-25 ENCOUNTER — Other Ambulatory Visit: Payer: Self-pay

## 2020-02-25 DIAGNOSIS — E213 Hyperparathyroidism, unspecified: Secondary | ICD-10-CM | POA: Diagnosis not present

## 2020-02-25 DIAGNOSIS — L219 Seborrheic dermatitis, unspecified: Secondary | ICD-10-CM | POA: Diagnosis not present

## 2020-02-25 DIAGNOSIS — L658 Other specified nonscarring hair loss: Secondary | ICD-10-CM | POA: Diagnosis not present

## 2020-02-25 NOTE — Progress Notes (Signed)
Name: Rebecca Paul  MRN/ DOB: [id number], 10-04-1956    Age/ Sex: 64 y.o., female     PCP: Caren Macadam, MD   Reason for Endocrinology Evaluation: Hypercalcemia      Initial Endocrinology Clinic Visit: 11/19/2019    PATIENT IDENTIFIER: Rebecca Paul is a 64 y.o., female with a past medical history of HTN and Dyslipidemia. She has followed with [facility] since 11/19/2019 for consultative assistance with management of her Hypercalcemia.   HISTORICAL SUMMARY: The patient was first diagnosed with hypercalcemia in 2019. She was on HCTZ on her initial presentation, she has had hx of kidney stones. No prior fractures or osteoporosis.   SUBJECTIVE:     Today (02/25/2020):  Rebecca Paul is here for a follow up on hypercalcemia  Denies polyuria nor polydipsia Denies constipation  Vitamin D 1000 iu daily  Denies renal stones  She consumes at least 2 servings a day       ROS:  As per HPI.   HISTORY:  Past Medical History:  Past Medical History:  Diagnosis Date  . Allergy    more sinus issues   . Anemia    yrs past as child , with pregnancy   . Arthritis    shoulders - not diagnosed   . Headache(784.0)   . Hyperlipidemia   . Hypertension   . IGT (impaired glucose tolerance)   . Kidney stone   . Osteopenia   . Plantar fasciitis    Past Surgical History:  Past Surgical History:  Procedure Laterality Date  . ABDOMINAL HYSTERECTOMY     fibroids  . CESAREAN SECTION    . COLONOSCOPY  01/2018   normal  . OOPHORECTOMY     dermoid tumor  . TONSILLECTOMY AND ADENOIDECTOMY      Social History:  reports that she has never smoked. She has never used smokeless tobacco. She reports current alcohol use. She reports that she does not use drugs. Family History:  Family History  Problem Relation Age of Onset  . Cancer Father        prostate  . Hypertension Mother   . Hyperlipidemia Mother   . Thyroid disease Mother   . Diabetes Mother   .  Obesity Sister   . Diabetes Sister   . Diabetes Other   . Hyperlipidemia Other   . Hypertension Other   . Cancer Other        prostate  . Kidney disease Other   . Obesity Sister   . Heart attack Sister   . Diabetes Maternal Grandmother   . Colon cancer Neg Hx   . Colon polyps Neg Hx   . Rectal cancer Neg Hx   . Stomach cancer Neg Hx      HOME MEDICATIONS: Allergies as of 02/25/2020   No Known Allergies     Medication List       Accurate as of Feb 25, 2020 10:14 AM. If you have any questions, ask your nurse or doctor.        amLODipine 5 MG tablet Commonly known as: NORVASC Take 1 tablet (5 mg total) by mouth daily.   blood glucose meter kit and supplies Kit Dispense based on patient and insurance preference. Use up to four times daily as directed. (FOR ICD-9 250.00, 250.01).   cholecalciferol 1000 units tablet Commonly known as: VITAMIN D Take 1,000 Units by mouth daily.   Contour Next Test test strip Generic drug: glucose blood Use as instructed  ELDERBERRY PO Take by mouth.   famciclovir 250 MG tablet Commonly known as: FAMVIR Take 1 tablet (250 mg total) by mouth 2 (two) times daily as needed.   fluticasone 50 MCG/ACT nasal spray Commonly known as: FLONASE Place 2 sprays into both nostrils daily.   losartan 100 MG tablet Commonly known as: COZAAR Take 1 tablet (100 mg total) by mouth daily.   pravastatin 40 MG tablet Commonly known as: PRAVACHOL TAKE 1 TABLET BY MOUTH EVERY DAY   vitamin C 500 MG tablet Commonly known as: ASCORBIC ACID Take 500 mg by mouth daily.   Vitamin D (Ergocalciferol) 1.25 MG (50000 UNIT) Caps capsule Commonly known as: DRISDOL Take 1 capsule (50,000 Units total) by mouth every 7 (seven) days.   vitamin E 45 MG (100 UNITS) capsule Take by mouth daily.         OBJECTIVE:   PHYSICAL EXAM: VS: BP 122/72 (BP Location: Right Arm, Patient Position: Sitting, Cuff Size: Large)   Pulse 68   Temp 98.6 F (37 C)    Ht 5' 1"  (1.549 m)   Wt 157 lb 9.6 oz (71.5 kg)   SpO2 99%   BMI 29.78 kg/m    EXAM: General: Pt appears well and is in NAD  Lungs: Clear with good BS bilat with no rales, rhonchi, or wheezes  Heart: Auscultation: RRR.  Abdomen: Normoactive bowel sounds, soft, nontender, without masses or organomegaly palpable  Extremities:  BL LE: No pretibial edema normal ROM and strength.  Skin: Hair: Texture and amount normal with gender appropriate distribution Skin Inspection: No rashes Skin Palpation: Skin temperature, texture, and thickness normal to palpation  Mental Status: Judgment, insight: Intact Orientation: Oriented to time, place, and person Mood and affect: No depression, anxiety, or agitation     DATA REVIEWED: Results for Rebecca Paul, Rebecca Paul (MRN [id number]) as of 02/25/2020 10:15  Ref. Range 02/19/2020 11:03  Potassium Latest Ref Range: 3.5 - 5.1 mEq/L 4.0  Chloride Latest Ref Range: 96 - 112 mEq/L 103  CO2 Latest Ref Range: 19 - 32 mEq/L 28  Glucose Latest Ref Range: 70 - 99 mg/dL 133 (H)  BUN Latest Ref Range: 6 - 23 mg/dL 13  Creatinine Latest Ref Range: 0.40 - 1.20 mg/dL 0.83  Calcium Latest Ref Range: 8.4 - 10.5 mg/dL 10.9 (H)  Alkaline Phosphatase Latest Ref Range: 39 - 117 U/L 51  Albumin Latest Ref Range: 3.5 - 5.2 g/dL 4.3  AST Latest Ref Range: 0 - 37 U/L 17  ALT Latest Ref Range: 0 - 35 U/L 15  Total Protein Latest Ref Range: 6.0 - 8.3 g/dL 7.9  Total Bilirubin Latest Ref Range: 0.2 - 1.2 mg/dL 0.6  GFR Latest Ref Range: >60.00 mL/min 84.06  VITD Latest Ref Range: 30.00 - 100.00 ng/mL 41.44    DXA 04/17/2018 AP spine    T score -0.8 Left femoral neck -2.0  Right femoral neck -1.8  ASSESSMENT / PLAN / RECOMMENDATIONS:   1. Hyperparathyroidism:  - Pt is asymptomatic  - PTH and calcium levels stable , corrected serum calcium 10.66 mg/dL  - Will proceed with 24- hr urine collection   Recommendations:  - Maintain hydration - Avoid over-the-counter calcium  tablets -Maintain 2-3 servings of  calcium in the diet   F/U in 4 months   Signed electronically by: Mack Guise, MD  [facility]  [facility] [address] [place], [place] [address] Phone: [place]: [phone]  CC: Caren Macadam, MD [place] [address] Phone: [phone]  Fax: [phone]   Return to Endocrinology clinic as below: No future appointments.

## 2020-02-25 NOTE — Patient Instructions (Signed)
-   Please stay hydrated ?- Avoid over the counter calcium tablets ?- Consume 2-3 servings of calcium in your diet  ? ? ?

## 2020-03-03 ENCOUNTER — Other Ambulatory Visit: Payer: BC Managed Care – PPO

## 2020-03-04 LAB — CALCIUM, URINE, 24 HOUR: Calcium, 24H Urine: 260 mg/24 h — ABNORMAL HIGH

## 2020-03-04 LAB — CREATININE, URINE, 24 HOUR: Creatinine, 24H Ur: 1.38 g/(24.h) (ref 0.50–2.15)

## 2020-03-10 NOTE — Telephone Encounter (Signed)
Spoke to pt and explained what her results and the message that you sent her in her My Chart meant, pt stated that she understood.

## 2020-03-10 NOTE — Telephone Encounter (Signed)
Patient called requesting to speak with Tashia regarding her labs. Patient ph# [phone]

## 2020-04-15 ENCOUNTER — Telehealth: Payer: Self-pay | Admitting: Family Medicine

## 2020-04-15 NOTE — Telephone Encounter (Signed)
No answer at the pts home number. 

## 2020-04-15 NOTE — Telephone Encounter (Signed)
Pt's sugar has been high for a few days and pt is changing insurance. Per pt, her diabetic strips are not covered by the new insurance and will need a new prescription after 04/17/20 for a new kit and strips. Pt requested to speak to you.

## 2020-04-22 ENCOUNTER — Other Ambulatory Visit: Payer: Self-pay | Admitting: Family Medicine

## 2020-04-22 DIAGNOSIS — E559 Vitamin D deficiency, unspecified: Secondary | ICD-10-CM

## 2020-05-01 NOTE — Telephone Encounter (Signed)
Left a message for the pt to return my call.  

## 2020-05-01 NOTE — Telephone Encounter (Signed)
Pt has called in and would like to have a call back on her cell phone.  Pt stated that her blood sugars spikes in the morning for no reason and she is not sure if the medication she is currently taking is working.  Pt would like to have a call back to elaborate on what she is wanting to do.

## 2020-05-01 NOTE — Telephone Encounter (Signed)
Spoke with the pt and she stated she has not been eating right, has been drinking sweet tea and the most recent glucose readings are as below: 7/15-fasting 6:01am-137, 8:43 AM 144 and later 139 7/14-fasting 5:44am-123, 9:23am 151 and after a meal 199 7/13-fasting 9:26am 194, 12:17pm 114  Follow up appt scheduled for 8/25 (earlier appts in August offered, per pts preference).  Message sent to PCP.

## 2020-05-01 NOTE — Telephone Encounter (Signed)
Please call back and get details about what sugars are doing in which medication she is referring to?  She is not currently taking medication for diabetes.  Last A1c was well controlled 2 months ago at 6.1.

## 2020-05-01 NOTE — Telephone Encounter (Signed)
Definitely encourage her to work on getting back on track with low carb diet and trying to avoid things that will make sugar jump up without fueling body at all (like sweet tea). Keep checking those fasting morning sugars. OK to check 2 hour post-meal sugars (don't check right after eating). Looks like sugars from today are pretty stable (at least not seeing in the 150's or 199 from day prior). If she continues to see elevated sugars at baseline over 140 we can discuss medication , but I do believe she preferred to manage with healthy eating/keeping up activity level previously. Bring in sugars for August visit and we can recheck A1C at that time as well.

## 2020-05-05 ENCOUNTER — Telehealth: Payer: Self-pay | Admitting: Family Medicine

## 2020-05-08 ENCOUNTER — Encounter: Payer: Self-pay | Admitting: Family Medicine

## 2020-05-08 ENCOUNTER — Ambulatory Visit: Payer: Managed Care, Other (non HMO) | Admitting: Family Medicine

## 2020-05-08 ENCOUNTER — Other Ambulatory Visit: Payer: Self-pay

## 2020-05-08 VITALS — BP 100/72 | HR 60 | Temp 98.4°F | Ht 61.0 in | Wt 153.7 lb

## 2020-05-08 DIAGNOSIS — I10 Essential (primary) hypertension: Secondary | ICD-10-CM

## 2020-05-08 DIAGNOSIS — E785 Hyperlipidemia, unspecified: Secondary | ICD-10-CM

## 2020-05-08 DIAGNOSIS — E213 Hyperparathyroidism, unspecified: Secondary | ICD-10-CM

## 2020-05-08 DIAGNOSIS — E1165 Type 2 diabetes mellitus with hyperglycemia: Secondary | ICD-10-CM

## 2020-05-08 DIAGNOSIS — Z20822 Contact with and (suspected) exposure to covid-19: Secondary | ICD-10-CM

## 2020-05-08 MED ORDER — FREESTYLE LIBRE 14 DAY READER DEVI: 1.0000 | Freq: Three times a day (TID) | 0 refills | Status: DC | PRN

## 2020-05-08 MED ORDER — FREESTYLE LIBRE 14 DAY SENSOR MISC: 1.0000 | 3 refills | Status: DC

## 2020-05-08 NOTE — Progress Notes (Signed)
Rebecca Paul DOB: 01-24-1956 Encounter date: 05/08/2020  This is a 64 y.o. female who presents with Chief Complaint  Patient presents with  . Hypertension    History of present illness: Following with endo for hyperparathyroid.   Concern for elevated sugar levels recently - last A1C 02/19/20 was 6.1. Sugars have been elevated in the mornings and states that trends haven't been good. This morning fasting was 135. Just ate once yesterday around lunch time. Weds fasting 11:56pm was 106. 7/20 at 6pm was 98 before a meal. 6:35am was 123 fasting. 7/19 5pm 116. 9:20am 114. Highest she saw was 199 (7/14) after a meal 4:24pm. Not sure how far after a meal she checked, but was 151 before eating. 7/13 194 7am. 7:30 am after meal 169. avg 124 fasting 90 days; 10 above target. No hypoglycemia. Before meal 25 above target 70-126. After meal avg 129; 6 above target (70-140).   Very stressed about COVID and going back to work. She is going to be interacting more with people and she is very worried about exposures.  She does not feel like her taking appropriate precautions with Covid.  Also diet very poor for a month and was also not exercising. Did start back exercising in July. Fell off treadmill at gym last Monday - wearing leggings but hit shin. Worried about healing. No swelling of shin.   Visit with Dr. Elease Hashimoto for htn; amlodipine increased to 22m daily in march to continue with losartan 1066m   Last visit with me was 10/2019.   LDL last checked 02/2020. Not at goal. Pravastatin on board but she had not been taking for a month prior to labwork.  A1 through work was 6.9 in May.   Last mammogram was in the last year; completes through gyn.   No Known Allergies Current Meds  Medication Sig  . amLODipine (NORVASC) 5 MG tablet Take 1 tablet (5 mg total) by mouth daily.  . blood glucose meter kit and supplies KIT Dispense based on patient and insurance preference. Use up to four times daily as  directed. (FOR ICD-9 250.00, 250.01).  . cholecalciferol (VITAMIN D) 1000 units tablet Take 1,000 Units by mouth daily.  . Marland KitchenLDERBERRY PO Take by mouth.  . famciclovir (FAMVIR) 250 MG tablet Take 1 tablet (250 mg total) by mouth 2 (two) times daily as needed.  . fluticasone (FLONASE) 50 MCG/ACT nasal spray Place 2 sprays into both nostrils daily.  . Marland Kitchenlucose blood (CONTOUR NEXT TEST) test strip Use as instructed  . losartan (COZAAR) 100 MG tablet Take 1 tablet (100 mg total) by mouth daily.  . pravastatin (PRAVACHOL) 40 MG tablet TAKE 1 TABLET BY MOUTH EVERY DAY  . vitamin C (ASCORBIC ACID) 500 MG tablet Take 1,000 mg by mouth daily.   . vitamin E 100 UNIT capsule Take by mouth daily.  . [DISCONTINUED] Vitamin D, Ergocalciferol, (DRISDOL) 1.25 MG (50000 UNIT) CAPS capsule Take 1 capsule (50,000 Units total) by mouth every 7 (seven) days.    Review of Systems  Constitutional: Negative for chills, fatigue and fever.  Respiratory: Negative for cough, chest tightness, shortness of breath and wheezing.   Cardiovascular: Negative for chest pain, palpitations and leg swelling.  Psychiatric/Behavioral: The patient is nervous/anxious (she is under a lot of stress with return to work).     Objective:  BP 100/72 (BP Location: Left Arm, Patient Position: Sitting, Cuff Size: Normal)   Pulse 60   Temp 98.4 F (36.9 C) (Oral)   Ht  5' 1"  (1.549 m)   Wt 153 lb 11.2 oz (69.7 kg)   BMI 29.04 kg/m   Weight: 153 lb 11.2 oz (69.7 kg)   BP Readings from Last 3 Encounters:  05/08/20 100/72  02/25/20 122/72  01/04/20 124/74   Wt Readings from Last 3 Encounters:  05/08/20 153 lb 11.2 oz (69.7 kg)  02/25/20 157 lb 9.6 oz (71.5 kg)  01/04/20 157 lb 5 oz (71.4 kg)    Physical Exam Constitutional:      General: She is not in acute distress.    Appearance: She is well-developed.  Cardiovascular:     Rate and Rhythm: Normal rate and regular rhythm.     Heart sounds: Normal heart sounds. No murmur  heard.  No friction rub.  Pulmonary:     Effort: Pulmonary effort is normal. No respiratory distress.     Breath sounds: Normal breath sounds. No wheezing or rales.  Musculoskeletal:     Right lower leg: No edema.     Left lower leg: No edema.  Skin:    Comments: 2 cm abrasion left shin without surrounding erythema or edema.  Healing well.  Neurological:     Mental Status: She is alert and oriented to person, place, and time.  Psychiatric:        Behavior: Behavior normal.     Assessment/Plan  1. Essential hypertension Well-controlled.  Continue current medication. - Comprehensive metabolic panel; Future - Comprehensive metabolic panel  2. Hyperparathyroidism [facility]) Following with endocrinology.  Stable.  3. Dyslipidemia She has restarted pravastatin.  We will recheck cholesterol today. - Lipid panel; Future - Lipid panel  4. Uncontrolled type 2 diabetes mellitus with hyperglycemia (HCC) We discussed not checking sugars and this is been at least 2 hours after meals.  We discussed that returning to regular exercise will help decrease sugars as well as adhering to low carbohydrate diet.  We will recheck A1c and see what this looks like.  Continue to check sugars at home.  Follow-up pending A1c. - Hemoglobin A1c; Future - Microalbumin / creatinine urine ratio; Future - Continuous Blood Gluc Receiver (FREESTYLE LIBRE 14 DAY READER) DEVI; 1 Device by Does not apply route 3 (three) times daily as needed.  Dispense: 1 each; Refill: 0 - Continuous Blood Gluc Sensor (FREESTYLE LIBRE 14 DAY SENSOR) MISC; 1 Device by Does not apply route every 14 (fourteen) days.  Dispense: 6 each; Refill: 3 - Microalbumin / creatinine urine ratio - Hemoglobin A1c  5. Exposure to COVID-19 virus Patient is curious if she has positive Covid antibodies. - SAR CoV2 Serology (COVID 19)AB(IGG)IA; Future - SAR CoV2 Serology (COVID 19)AB(IGG)IA   We also discussed home testing kits for Covid. Return in  about 3 months (around 08/08/2020) for Chronic condition visit.     Micheline Rough, MD

## 2020-05-09 LAB — COMPREHENSIVE METABOLIC PANEL
AG Ratio: 1.4 (calc) (ref 1.0–2.5)
ALT: 10 U/L (ref 6–29)
AST: 14 U/L (ref 10–35)
Albumin: 4.4 g/dL (ref 3.6–5.1)
Alkaline phosphatase (APISO): 57 U/L (ref 37–153)
BUN: 13 mg/dL (ref 7–25)
CO2: 25 mmol/L (ref 20–32)
Calcium: 11.2 mg/dL — ABNORMAL HIGH (ref 8.6–10.4)
Chloride: 106 mmol/L (ref 98–110)
Creat: 0.89 mg/dL (ref 0.50–0.99)
Globulin: 3.2 g/dL (calc) (ref 1.9–3.7)
Glucose, Bld: 120 mg/dL — ABNORMAL HIGH (ref 65–99)
Potassium: 4.2 mmol/L (ref 3.5–5.3)
Sodium: 139 mmol/L (ref 135–146)
Total Bilirubin: 0.7 mg/dL (ref 0.2–1.2)
Total Protein: 7.6 g/dL (ref 6.1–8.1)

## 2020-05-09 LAB — MICROALBUMIN / CREATININE URINE RATIO
Creatinine, Urine: 142 mg/dL (ref 20–275)
Microalb Creat Ratio: 6 mcg/mg creat (ref <30)
Microalb, Ur: 0.8 mg/dL

## 2020-05-09 LAB — HEMOGLOBIN A1C
Hgb A1c MFr Bld: 6.1 % of total Hgb — ABNORMAL HIGH (ref <5.7)
Mean Plasma Glucose: 128 (calc)
eAG (mmol/L): 7.1 (calc)

## 2020-05-09 LAB — LIPID PANEL
Cholesterol: 199 mg/dL (ref <200)
HDL: 56 mg/dL (ref >=50)
LDL Cholesterol (Calc): 126 mg/dL (calc) — ABNORMAL HIGH
Non-HDL Cholesterol (Calc): 143 mg/dL (calc) — ABNORMAL HIGH (ref <130)
Total CHOL/HDL Ratio: 3.6 (calc) (ref <5.0)
Triglycerides: 77 mg/dL (ref <150)

## 2020-05-09 LAB — SARS-COV-2 ANTIBODY(IGG)SPIKE,SEMI-QUANTITATIVE: SARS COV1 AB(IGG)SPIKE,SEMI QN: >20 index — ABNORMAL HIGH (ref <1.00)

## 2020-05-12 ENCOUNTER — Other Ambulatory Visit: Payer: Managed Care, Other (non HMO)

## 2020-05-12 ENCOUNTER — Telehealth: Payer: Self-pay | Admitting: Family Medicine

## 2020-05-12 MED ORDER — BLOOD GLUCOSE METER KIT: PACK | 0 refills | Status: DC

## 2020-05-12 NOTE — Telephone Encounter (Signed)
This just means that she presumably had Covid at some point in the past- but does not say exactly when.  There is no standard on follow up testing.   This does not indicate acute infection.   She can discuss with Dr Raliegh Ip.  OK to order monitor.

## 2020-05-12 NOTE — Telephone Encounter (Signed)
Spoke with the pt and informed her of the message below.  I also advised the pt the Rx for a Freestyle Elenor Legato was sent on 7/22 and if this is not covered an Rx for a general glucometer kit and supplies was faxed to [facility] at [phone] and she can choose the brand she prefers.

## 2020-05-12 NOTE — Telephone Encounter (Signed)
Pt called to say she received a mychart result for her COVID Antibodies test and it was positive, she wants to know if she should follow up with a test.  Also, asked if Dr. Ethlyn Gallery Put in a prescription for her diabetic monitor?  Pt can be reached at [phone]  Please advise

## 2020-05-14 ENCOUNTER — Other Ambulatory Visit: Payer: Managed Care, Other (non HMO)

## 2020-06-09 ENCOUNTER — Ambulatory Visit (INDEPENDENT_AMBULATORY_CARE_PROVIDER_SITE_OTHER)
Admission: RE | Admit: 2020-06-09 | Discharge: 2020-06-09 | Disposition: A | Discharge status: Home / Self Care | Payer: Managed Care, Other (non HMO) | Source: Ambulatory Visit | Attending: Family Medicine | Admitting: Family Medicine

## 2020-06-09 ENCOUNTER — Ambulatory Visit (INDEPENDENT_AMBULATORY_CARE_PROVIDER_SITE_OTHER): Payer: Managed Care, Other (non HMO) | Admitting: Family Medicine

## 2020-06-09 ENCOUNTER — Encounter: Payer: Self-pay | Admitting: Family Medicine

## 2020-06-09 ENCOUNTER — Other Ambulatory Visit: Payer: Self-pay

## 2020-06-09 VITALS — BP 120/70 | HR 58 | Temp 98.4°F | Wt 155.0 lb

## 2020-06-09 DIAGNOSIS — M79671 Pain in right foot: Secondary | ICD-10-CM

## 2020-06-09 DIAGNOSIS — M79674 Pain in right toe(s): Secondary | ICD-10-CM

## 2020-06-09 NOTE — Patient Instructions (Signed)

## 2020-06-09 NOTE — Progress Notes (Signed)
Subjective:    Patient ID: Rebecca Paul, female    DOB: 19-May-1956, 64 y.o.   MRN: [id number]  No chief complaint on file.   HPI Pt is a 64 yo female with pmh sig for DM II, HLD, HTN, hyperparathyroidism, vitamin D deficiency followed by Dr. Ethlyn Gallery.  Pt seen today for ongoing concern of R foot pain x2 weeks after stubbing her toe on a wicker basket.  Pt states her foot is swollen and painful.  Pt notes pain in 4th toe with movement.  Wearing flip flops as has not tried to wear closed toe shoes.  Patient has tried Tylenol, Epson salt, and rubbing alcohol for her symptoms.  Past Medical History:  Diagnosis Date  . Allergy    more sinus issues   . Anemia    yrs past as child , with pregnancy   . Arthritis    shoulders - not diagnosed   . Headache(784.0)   . Hyperlipidemia   . Hypertension   . IGT (impaired glucose tolerance)   . Kidney stone   . Osteopenia   . Plantar fasciitis     No Known Allergies  ROS General: Denies fever, chills, night sweats, changes in weight, changes in appetite HEENT: Denies headaches, ear pain, changes in vision, rhinorrhea, sore throat CV: Denies CP, palpitations, SOB, orthopnea Pulm: Denies SOB, cough, wheezing GI: Denies abdominal pain, nausea, vomiting, diarrhea, constipation GU: Denies dysuria, hematuria, frequency, vaginal discharge Msk: Denies muscle cramps, joint pains  +R foot edema and 4th toe pain Neuro: Denies weakness, numbness, tingling Skin: Denies rashes, bruising Psych: Denies depression, anxiety, hallucinations      Objective:    Blood pressure 120/70, pulse (!) 58, temperature 98.4 F (36.9 C), temperature source Oral, weight 155 lb (70.3 kg), SpO2 97 %.   Gen. Pleasant, well-nourished, in no distress, normal affect  HEENT: [place]/AT, face symmetric, conjunctiva clear, no scleral icterus, PERRLA, EOMI, nares patent without drainage Lungs: no accessory muscle use Cardiovascular: RRR, no peripheral edema Musculoskeletal:  Edema of dorsum of R foot at MTP joints of 3rd, 4th, and 5th digits.  TTP of 4th MTP joint and with movement of 4th digit.  Edema of 4th digit on R foot.  No cyanosis or clubbing, normal tone Neuro:  A&Ox3, CN II-XII intact, normal gait Skin:  Warm, no lesions/ rash   Wt Readings from Last 3 Encounters:  06/09/20 155 lb (70.3 kg)  05/08/20 153 lb 11.2 oz (69.7 kg)  02/25/20 157 lb 9.6 oz (71.5 kg)    Lab Results  Component Value Date   WBC 6.0 02/19/2020   HGB 12.9 02/19/2020   HCT 37.1 02/19/2020   PLT 246.0 02/19/2020   GLUCOSE 120 (H) 05/08/2020   CHOL 199 05/08/2020   TRIG 77 05/08/2020   HDL 56 05/08/2020   LDLDIRECT 140.3 12/04/2012   LDLCALC 126 (H) 05/08/2020   ALT 10 05/08/2020   AST 14 05/08/2020   NA 139 05/08/2020   K 4.2 05/08/2020   CL 106 05/08/2020   CREATININE 0.89 05/08/2020   BUN 13 05/08/2020   CO2 25 05/08/2020   TSH 1.02 09/18/2018   HGBA1C 6.1 (H) 05/08/2020   MICROALBUR 0.8 05/08/2020    Assessment/Plan:  Right foot pain -concern for fx of R 4th digit or 4th metatarsal.   -will obtain imaging. -consider post op walking shoe based on imaging -supportive care including ice, rest, elevation, NSAIDs  - Plan: DG Foot Complete Right  Toe pain, right  -  Plan: DG Foot Complete Right  F/u prn  Grier Mitts, MD

## 2020-06-10 ENCOUNTER — Ambulatory Visit (INDEPENDENT_AMBULATORY_CARE_PROVIDER_SITE_OTHER): Payer: Managed Care, Other (non HMO) | Admitting: Family Medicine

## 2020-06-10 VITALS — BP 124/72 | HR 60 | Temp 98.6°F | Wt 156.5 lb

## 2020-06-10 DIAGNOSIS — S92911D Unspecified fracture of right toe(s), subsequent encounter for fracture with routine healing: Secondary | ICD-10-CM | POA: Diagnosis not present

## 2020-06-10 NOTE — Patient Instructions (Signed)
Continue with elevation and icing  Would use the post-op shoe for 3-4 weeks and then graduate to comfortable shoes as tolerated.

## 2020-06-10 NOTE — Progress Notes (Signed)
Established Patient Office Visit  Subjective:  Patient ID: Rebecca Paul, female    DOB: 08/31/1956  Age: 64 y.o. MRN: [id number]  CC:  Chief Complaint  Patient presents with  . Foot Pain    has fracture in toe needs post op book     HPI Rebecca Paul presents for follow-up right fourth toe fracture.  She states about 2 weeks ago she accidentally stubbed her toe on a wicker basket in her bathroom.  She had immediate pain and swelling and had persistent pain with ambulation.  She had called requesting x-ray which was obtained yesterday.  This showed oblique diaphyseal fracture fourth proximal phalanx with good alignment. She continues to have some swelling and pain with ambulation  Past Medical History:  Diagnosis Date  . Allergy    more sinus issues   . Anemia    yrs past as child , with pregnancy   . Arthritis    shoulders - not diagnosed   . Headache(784.0)   . Hyperlipidemia   . Hypertension   . IGT (impaired glucose tolerance)   . Kidney stone   . Osteopenia   . Plantar fasciitis     Past Surgical History:  Procedure Laterality Date  . ABDOMINAL HYSTERECTOMY     fibroids  . CESAREAN SECTION    . COLONOSCOPY  01/2018   normal  . OOPHORECTOMY     dermoid tumor  . TONSILLECTOMY AND ADENOIDECTOMY      Family History  Problem Relation Age of Onset  . Cancer Father        prostate  . Hypertension Mother   . Hyperlipidemia Mother   . Thyroid disease Mother   . Diabetes Mother   . Obesity Sister   . Diabetes Sister   . Diabetes Other   . Hyperlipidemia Other   . Hypertension Other   . Cancer Other        prostate  . Kidney disease Other   . Obesity Sister   . Heart attack Sister   . Diabetes Maternal Grandmother   . Colon cancer Neg Hx   . Colon polyps Neg Hx   . Rectal cancer Neg Hx   . Stomach cancer Neg Hx     Social History   Socioeconomic History  . Marital status: Married    Spouse name: Not on file  . Number of children: Not on file    . Years of education: Not on file  . Highest education level: Not on file  Occupational History  . Not on file  Tobacco Use  . Smoking status: Never Smoker  . Smokeless tobacco: Never Used  Vaping Use  . Vaping Use: Never used  Substance and Sexual Activity  . Alcohol use: Yes    Comment: rare glass of wine   . Drug use: No  . Sexual activity: Not on file  Other Topics Concern  . Not on file  Social History Narrative  . Not on file   Social Determinants of Health   Financial Resource Strain:   . Difficulty of Paying Living Expenses: Not on file  Food Insecurity:   . Worried About Charity fundraiser in the Last Year: Not on file  . Ran Out of Food in the Last Year: Not on file  Transportation Needs:   . Lack of Transportation (Medical): Not on file  . Lack of Transportation (Non-Medical): Not on file  Physical Activity:   . Days of Exercise per Week:  Not on file  . Minutes of Exercise per Session: Not on file  Stress:   . Feeling of Stress : Not on file  Social Connections:   . Frequency of Communication with Friends and Family: Not on file  . Frequency of Social Gatherings with Friends and Family: Not on file  . Attends Religious Services: Not on file  . Active Member of Clubs or Organizations: Not on file  . Attends Archivist Meetings: Not on file  . Marital Status: Not on file  Intimate Partner Violence:   . Fear of Current or Ex-Partner: Not on file  . Emotionally Abused: Not on file  . Physically Abused: Not on file  . Sexually Abused: Not on file    Outpatient Medications Prior to Visit  Medication Sig Dispense Refill  . amLODipine (NORVASC) 5 MG tablet Take 1 tablet (5 mg total) by mouth daily. 90 tablet 3  . blood glucose meter kit and supplies KIT Dispense based on patient and insurance preference. Use up to four times daily as directed. (FOR ICD-9 250.00, 250.01). 1 each 0  . blood glucose meter kit and supplies Use as directed once a day 1  each 0  . cholecalciferol (VITAMIN D) 1000 units tablet Take 1,000 Units by mouth daily.    . Continuous Blood Gluc Receiver (FREESTYLE LIBRE 14 DAY READER) DEVI 1 Device by Does not apply route 3 (three) times daily as needed. 1 each 0  . Continuous Blood Gluc Sensor (FREESTYLE LIBRE 14 DAY SENSOR) MISC 1 Device by Does not apply route every 14 (fourteen) days. 6 each 3  . ELDERBERRY PO Take by mouth.    . famciclovir (FAMVIR) 250 MG tablet Take 1 tablet (250 mg total) by mouth 2 (two) times daily as needed. 90 tablet 1  . fluticasone (FLONASE) 50 MCG/ACT nasal spray Place 2 sprays into both nostrils daily. 16 g 6  . glucose blood (CONTOUR NEXT TEST) test strip Use as instructed 100 each 12  . losartan (COZAAR) 100 MG tablet Take 1 tablet (100 mg total) by mouth daily. 90 tablet 3  . pravastatin (PRAVACHOL) 40 MG tablet TAKE 1 TABLET BY MOUTH EVERY DAY 90 tablet 1  . vitamin C (ASCORBIC ACID) 500 MG tablet Take 1,000 mg by mouth daily.     . vitamin E 100 UNIT capsule Take by mouth daily.     No facility-administered medications prior to visit.    No Known Allergies  ROS Review of Systems  Constitutional: Negative for chills and fever.      Objective:    Physical Exam Vitals reviewed.  Constitutional:      Appearance: Normal appearance.  Cardiovascular:     Rate and Rhythm: Normal rate and regular rhythm.  Musculoskeletal:     Comments: Right fourth toe reveals some swelling and mild tenderness proximally over the proximal phalanx.  No obvious breaks in the skin.  No erythema.  No warmth.  Neurological:     Mental Status: She is alert.     BP 124/72 (BP Location: Left Arm, Patient Position: Sitting, Cuff Size: Normal)   Pulse 60   Temp 98.6 F (37 C) (Oral)   Wt 156 lb 8 oz (71 kg)   SpO2 98%   BMI 29.57 kg/m  Wt Readings from Last 3 Encounters:  06/10/20 156 lb 8 oz (71 kg)  06/09/20 155 lb (70.3 kg)  05/08/20 153 lb 11.2 oz (69.7 kg)     Health Maintenance Due  Topic Date Due  . OPHTHALMOLOGY EXAM  10/19/2019  . MAMMOGRAM  04/18/2020  . INFLUENZA VACCINE  05/18/2020    There are no preventive care reminders to display for this patient.  Lab Results  Component Value Date   TSH 1.02 09/18/2018   Lab Results  Component Value Date   WBC 6.0 02/19/2020   HGB 12.9 02/19/2020   HCT 37.1 02/19/2020   MCV 80.5 02/19/2020   PLT 246.0 02/19/2020   Lab Results  Component Value Date   NA 139 05/08/2020   K 4.2 05/08/2020   CO2 25 05/08/2020   GLUCOSE 120 (H) 05/08/2020   BUN 13 05/08/2020   CREATININE 0.89 05/08/2020   BILITOT 0.7 05/08/2020   ALKPHOS 51 02/19/2020   AST 14 05/08/2020   ALT 10 05/08/2020   PROT 7.6 05/08/2020   ALBUMIN 4.3 02/19/2020   CALCIUM 11.2 (H) 05/08/2020   GFR 84.06 02/19/2020   Lab Results  Component Value Date   CHOL 199 05/08/2020   Lab Results  Component Value Date   HDL 56 05/08/2020   Lab Results  Component Value Date   LDLCALC 126 (H) 05/08/2020   Lab Results  Component Value Date   TRIG 77 05/08/2020   Lab Results  Component Value Date   CHOLHDL 3.6 05/08/2020   Lab Results  Component Value Date   HGBA1C 6.1 (H) 05/08/2020      Assessment & Plan:   Problem List Items Addressed This Visit    None    Visit Diagnoses    Closed nondisplaced fracture of phalanx of toe of right foot with routine healing, unspecified toe, subsequent encounter    -  Primary    Patient has oblique diaphyseal fracture right fourth proximal phalanx with good alignment.  She was given postop shoe and this was more comfortable for ambulation.  She had been wearing some sandals without much support.  We discussed buddy taping but have decided against that she feels fairly comfortable in the postop shoe alone  We have recommended she continue with frequent elevation icing.  Expect uneventful healing in 4 to 6 weeks.  She will gradually transition to comfortable shoe wear over time  No orders of the defined  types were placed in this encounter.   Follow-up: No follow-ups on file.    Carolann Littler, MD

## 2020-06-10 NOTE — Telephone Encounter (Signed)
error 

## 2020-06-11 ENCOUNTER — Ambulatory Visit: Payer: BC Managed Care – PPO | Admitting: Family Medicine

## 2020-06-30 ENCOUNTER — Ambulatory Visit: Payer: BC Managed Care – PPO | Admitting: Internal Medicine

## 2020-07-07 LAB — HM DEXA SCAN

## 2020-07-09 LAB — HM PAP SMEAR: HM Pap smear: NEGATIVE

## 2020-07-21 LAB — HM MAMMOGRAPHY

## 2020-08-04 ENCOUNTER — Ambulatory Visit: Payer: Self-pay

## 2020-08-05 ENCOUNTER — Ambulatory Visit: Payer: Managed Care, Other (non HMO) | Attending: Internal Medicine

## 2020-08-05 DIAGNOSIS — Z23 Encounter for immunization: Secondary | ICD-10-CM

## 2020-08-05 NOTE — Progress Notes (Signed)
   Covid-[facility] Vaccination [facility]  Name:  Rebecca Paul    MRN: [id number] DOB: 04-25-1956  08/05/2020  Rebecca Paul was observed post Covid-19 immunization for 15 minutes without incident. She was provided with Vaccine Information Sheet and instruction to access the V-Safe system.   Rebecca Paul was instructed to call 911 with any severe reactions post vaccine: Marland Kitchen Difficulty breathing  . Swelling of face and throat  . A fast heartbeat  . A bad rash all over body  . Dizziness and weakness

## 2020-08-11 ENCOUNTER — Ambulatory Visit: Payer: Managed Care, Other (non HMO) | Admitting: Internal Medicine

## 2020-08-11 ENCOUNTER — Other Ambulatory Visit: Payer: Self-pay

## 2020-08-11 ENCOUNTER — Encounter: Payer: Self-pay | Admitting: Internal Medicine

## 2020-08-11 VITALS — BP 130/78 | HR 78 | Ht 61.0 in | Wt 154.5 lb

## 2020-08-11 DIAGNOSIS — E213 Hyperparathyroidism, unspecified: Secondary | ICD-10-CM | POA: Diagnosis not present

## 2020-08-11 NOTE — Patient Instructions (Signed)
-   Please stay hydrated ?- Avoid over the counter calcium tablets ?- Consume 2-3 servings of calcium in your diet  ? ? ?

## 2020-08-11 NOTE — Progress Notes (Signed)
Name: Rebecca Paul  MRN/ DOB: [id number], Jan 20, 1956    Age/ Sex: 64 y.o., female     PCP: Caren Macadam, MD   Reason for Endocrinology Evaluation: Hypercalcemia      Initial Endocrinology Clinic Visit: 11/19/2019    PATIENT IDENTIFIER: Rebecca Paul is a 64 y.o., female with a past medical history of HTN and Dyslipidemia. She has followed with [facility] since 11/19/2019 for consultative assistance with management of her Hypercalcemia.   HISTORICAL SUMMARY: The patient was first diagnosed with hypercalcemia in 2019. She was on HCTZ on her initial presentation, she has had hx of kidney stones. No prior fractures or osteoporosis.  Ca/Cr ratio 0.014  DXA (2019) Low bone density  SUBJECTIVE:    Today (08/11/2020):  [name] is here for a follow up on hypercalcemia  Denies polyuria nor polydipsia Denies constipation  Vitamin D 1000 iu daily  Denies renal stones  She has not been consuming 2 servings a day    HISTORY:  Past Medical History:  Past Medical History:  Diagnosis Date   Allergy    more sinus issues    Anemia    yrs past as child , with pregnancy    Arthritis    shoulders - not diagnosed    Headache(784.0)    Hyperlipidemia    Hypertension    IGT (impaired glucose tolerance)    Kidney stone    Osteopenia    Plantar fasciitis    Past Surgical History:  Past Surgical History:  Procedure Laterality Date   ABDOMINAL HYSTERECTOMY     fibroids   CESAREAN SECTION     COLONOSCOPY  01/2018   normal   OOPHORECTOMY     dermoid tumor   TONSILLECTOMY AND ADENOIDECTOMY      Social History:  reports that she has never smoked. She has never used smokeless tobacco. She reports current alcohol use. She reports that she does not use drugs. Family History:  Family History  Problem Relation Age of Onset   Cancer Father        prostate   Hypertension Mother    Hyperlipidemia Mother    Thyroid disease Mother     Diabetes Mother    Obesity Sister    Diabetes Sister    Diabetes Other    Hyperlipidemia Other    Hypertension Other    Cancer Other        prostate   Kidney disease Other    Obesity Sister    Heart attack Sister    Diabetes Maternal Grandmother    Colon cancer Neg Hx    Colon polyps Neg Hx    Rectal cancer Neg Hx    Stomach cancer Neg Hx      HOME MEDICATIONS: Allergies as of 08/11/2020   No Known Allergies     Medication List       Accurate as of August 11, 2020  8:52 AM. If you have any questions, ask your nurse or doctor.        amLODipine 5 MG tablet Commonly known as: NORVASC Take 1 tablet (5 mg total) by mouth daily.   blood glucose meter kit and supplies Use as directed once a day   blood glucose meter kit and supplies Kit Dispense based on patient and insurance preference. Use up to four times daily as directed. (FOR ICD-9 250.00, 250.01).   cholecalciferol 1000 units tablet Commonly known as: VITAMIN D Take 1,000 Units by mouth daily.  Contour Next Test test strip Generic drug: glucose blood Use as instructed   ELDERBERRY PO Take by mouth.   famciclovir 250 MG tablet Commonly known as: FAMVIR Take 1 tablet (250 mg total) by mouth 2 (two) times daily as needed.   fluticasone 50 MCG/ACT nasal spray Commonly known as: FLONASE Place 2 sprays into both nostrils daily.   FreeStyle Libre 14 Day Reader Kerrin Mo 1 Device by Does not apply route 3 (three) times daily as needed.   FreeStyle Libre 14 Day Sensor Misc 1 Device by Does not apply route every 14 (fourteen) days.   losartan 100 MG tablet Commonly known as: COZAAR Take 1 tablet (100 mg total) by mouth daily.   pravastatin 40 MG tablet Commonly known as: PRAVACHOL TAKE 1 TABLET BY MOUTH EVERY DAY   vitamin C 500 MG tablet Commonly known as: ASCORBIC ACID Take 1,000 mg by mouth daily.   vitamin E 45 MG (100 UNITS) capsule Take by mouth daily.         OBJECTIVE:    PHYSICAL EXAM: VS: BP 130/78    Pulse 78    Ht 5' 1"  (1.549 m)    Wt 154 lb 8 oz (70.1 kg)    SpO2 95%    BMI 29.19 kg/m    EXAM: General: Pt appears well and is in NAD  Lungs: Clear with good BS bilat with no rales, rhonchi, or wheezes  Heart: Auscultation: RRR.  Abdomen: Normoactive bowel sounds, soft, nontender, without masses or organomegaly palpable  Extremities:  BL LE: No pretibial edema normal ROM and strength.  Mental Status: Judgment, insight: Intact Orientation: Oriented to time, place, and person Mood and affect: No depression, anxiety, or agitation     DATA REVIEWED: Results for DYNESHA, WOOLEN (MRN [id number]) as of 08/12/2020 11:07  Ref. Range 05/08/2020 08:51  COMPREHENSIVE METABOLIC PANEL Unknown Rpt (A)  Sodium Latest Ref Range: 135 - 146 mmol/L 139  Potassium Latest Ref Range: 3.5 - 5.3 mmol/L 4.2  Chloride Latest Ref Range: 98 - 110 mmol/L 106  CO2 Latest Ref Range: 20 - 32 mmol/L 25  Glucose Latest Ref Range: 65 - 99 mg/dL 120 (H)  Mean Plasma Glucose Latest Units: (calc) 128  BUN Latest Ref Range: 7 - 25 mg/dL 13  Creatinine Latest Ref Range: 0.50 - 0.99 mg/dL 0.89  Calcium Latest Ref Range: 8.6 - 10.4 mg/dL 11.2 (H)  BUN/Creatinine Ratio Latest Ref Range: 6 - 22 (calc) NOT APPLICABLE  AG Ratio Latest Ref Range: 1.0 - 2.5 (calc) 1.4  AST Latest Ref Range: 10 - 35 U/L 14  ALT Latest Ref Range: 6 - 29 U/L 10  Total Protein Latest Ref Range: 6.1 - 8.1 g/dL 7.6  Total Bilirubin Latest Ref Range: 0.2 - 1.2 mg/dL 0.7     DXA 04/17/2018 [facility]    T score -0.8 Left femoral neck -2.0  Right femoral neck -1.8    DXA 07/07/2020  AP spine -1.0 LFN -2.3 RFN -2.2 ASSESSMENT / PLAN / RECOMMENDATIONS:   1. Hyperparathyroidism:   - Suspect this is primary hyperparathyroidism/ Ca/Cr ratio was inconclusive at 0.014 , but I suspect this it due to low dietary calcium intake  - Pt is asymptomatic  - Bone density shows low bone density but no osteoporosis   - She would like to have labs at PCP's office through her physical next month  - She was provided with an order for a KUB to r/o nephrocalcinosis or stones.  Recommendations:  - Maintain hydration - Avoid over-the-counter calcium tablets -Maintain 2-3 servings of  calcium in the diet   F/U in 6 months   Signed electronically by: Mack Guise, MD  [facility]  [facility] [address] [place], [place] [address] Phone: [phone] FAX: [phone]      CC: Caren Macadam, [address] [place] [address] Phone: [phone]  Fax: [phone]   Return to Endocrinology clinic as below: No future appointments.

## 2020-08-25 ENCOUNTER — Ambulatory Visit (INDEPENDENT_AMBULATORY_CARE_PROVIDER_SITE_OTHER): Payer: Managed Care, Other (non HMO) | Admitting: Family Medicine

## 2020-08-25 ENCOUNTER — Other Ambulatory Visit: Payer: Self-pay

## 2020-08-25 ENCOUNTER — Encounter: Payer: Self-pay | Admitting: Family Medicine

## 2020-08-25 VITALS — BP 112/80 | HR 81 | Temp 98.8°F | Ht 61.0 in | Wt 152.8 lb

## 2020-08-25 DIAGNOSIS — E213 Hyperparathyroidism, unspecified: Secondary | ICD-10-CM

## 2020-08-25 DIAGNOSIS — M85852 Other specified disorders of bone density and structure, left thigh: Secondary | ICD-10-CM

## 2020-08-25 DIAGNOSIS — M85851 Other specified disorders of bone density and structure, right thigh: Secondary | ICD-10-CM | POA: Diagnosis not present

## 2020-08-25 DIAGNOSIS — E785 Hyperlipidemia, unspecified: Secondary | ICD-10-CM

## 2020-08-25 DIAGNOSIS — I10 Essential (primary) hypertension: Secondary | ICD-10-CM

## 2020-08-25 DIAGNOSIS — E559 Vitamin D deficiency, unspecified: Secondary | ICD-10-CM

## 2020-08-25 DIAGNOSIS — R079 Chest pain, unspecified: Secondary | ICD-10-CM

## 2020-08-25 DIAGNOSIS — M8580 Other specified disorders of bone density and structure, unspecified site: Secondary | ICD-10-CM | POA: Insufficient documentation

## 2020-08-25 DIAGNOSIS — R252 Cramp and spasm: Secondary | ICD-10-CM

## 2020-08-25 DIAGNOSIS — E1169 Type 2 diabetes mellitus with other specified complication: Secondary | ICD-10-CM

## 2020-08-25 DIAGNOSIS — Z0000 Encounter for general adult medical examination without abnormal findings: Secondary | ICD-10-CM | POA: Diagnosis not present

## 2020-08-25 NOTE — Progress Notes (Signed)
Rebecca Paul DOB: 10-27-1955 Encounter date: 08/25/2020  This is a 64 y.o. female who presents for complete physical   History of present illness/Additional concerns:  States that sugars have been high in the morning - 150, 160. Not consistent. Not checking it as much as she was previously. Hasn't set up patch yet for checking. Has lost weight though. Appetite has not been as good. Not eating breakfast like she should; then eats lunch. Has been eating things she shouldn't like sweets.   Recently saw endocrinology 08/11/2020: For primary hyperparathyroidism; she was advised to take in 2-3 servings of calcium in the diet and avoid over-the-counter calcium tablets.  Hypertension: Amlodipine 5 mg, losartan 100 mg.   Hyperlipidemia: Pravastatin 40 mg daily - feels she is having muscle issues from this. Feels achy. Has catch in back - lower right back radiating down leg. We had increased dose from 43m to 442mto get better LDL control after last bloodwork.   Every now and then she gets a little sharp sensation in chest. Goes away after she feels it. Not sure if related to eating. Notes more when lying down. States hasn't had an EKG for awhile. No associated issues with breathing, no racing heart or pauses in heart.   Sinuses stay congested; esp right side. Always has a cough with chronic sinus congestion. Sometimes more cough with post nasal drainage.   [facility]: [facility] - mammogram (9/13- normal), bone density (9/20 osteopenia both hips) and pap were all good and completed in September.   She does follow with Dr.Gruber for dermatology needs - seeing her for hair/scalp treatments.   Past Medical History:  Diagnosis Date  . Allergy    more sinus issues   . Anemia    yrs past as child , with pregnancy   . Arthritis    shoulders - not diagnosed   . Headache(784.0)   . Hyperlipidemia   . Hypertension   . IGT (impaired glucose tolerance)   . Kidney stone   . Osteopenia   .  Plantar fasciitis    Past Surgical History:  Procedure Laterality Date  . ABDOMINAL HYSTERECTOMY     fibroids  . CESAREAN SECTION    . COLONOSCOPY  01/2018   normal  . OOPHORECTOMY     dermoid tumor  . TONSILLECTOMY AND ADENOIDECTOMY     No Known Allergies Current Meds  Medication Sig  . amLODipine (NORVASC) 5 MG tablet Take 1 tablet (5 mg total) by mouth daily.  . blood glucose meter kit and supplies KIT Dispense based on patient and insurance preference. Use up to four times daily as directed. (FOR ICD-9 250.00, 250.01).  . blood glucose meter kit and supplies Use as directed once a day  . cholecalciferol (VITAMIN D) 1000 units tablet Take 1,000 Units by mouth daily.  . Continuous Blood Gluc Receiver (FREESTYLE LIBRE 14 DAY READER) DEVI 1 Device by Does not apply route 3 (three) times daily as needed.  . Continuous Blood Gluc Sensor (FREESTYLE LIBRE 14 DAY SENSOR) MISC 1 Device by Does not apply route every 14 (fourteen) days.  . Marland KitchenLDERBERRY PO Take by mouth.  . famciclovir (FAMVIR) 250 MG tablet Take 1 tablet (250 mg total) by mouth 2 (two) times daily as needed.  . fluticasone (FLONASE) 50 MCG/ACT nasal spray Place 2 sprays into both nostrils daily.  . Marland Kitchenlucose blood (CONTOUR NEXT TEST) test strip Use as instructed  . losartan (COZAAR) 100 MG tablet Take 1 tablet (100  mg total) by mouth daily.  . pravastatin (PRAVACHOL) 40 MG tablet TAKE 1 TABLET BY MOUTH EVERY DAY  . vitamin C (ASCORBIC ACID) 500 MG tablet Take 1,000 mg by mouth daily.   . vitamin E 100 UNIT capsule Take by mouth daily.   Social History   Tobacco Use  . Smoking status: Never Smoker  . Smokeless tobacco: Never Used  Substance Use Topics  . Alcohol use: Yes    Comment: rare glass of wine    Family History  Problem Relation Age of Onset  . Cancer Father        prostate  . Hypertension Mother   . Hyperlipidemia Mother   . Thyroid disease Mother   . Diabetes Mother   . Obesity Sister   . Diabetes Sister    . Diabetes Other   . Hyperlipidemia Other   . Hypertension Other   . Cancer Other        prostate  . Kidney disease Other   . Obesity Sister   . Heart attack Sister   . Diabetes Maternal Grandmother   . Colon cancer Neg Hx   . Colon polyps Neg Hx   . Rectal cancer Neg Hx   . Stomach cancer Neg Hx      Review of Systems  Constitutional: Negative for activity change, appetite change, chills, fatigue, fever and unexpected weight change.  HENT: Positive for congestion. Negative for ear pain, hearing loss, sinus pressure, sinus pain, sore throat and trouble swallowing.   Eyes: Negative for pain and visual disturbance.  Respiratory: Positive for cough (w PND). Negative for chest tightness, shortness of breath and wheezing.   Cardiovascular: Positive for chest pain (sharp; intermittent). Negative for palpitations and leg swelling.  Gastrointestinal: Negative for abdominal pain, blood in stool, constipation, diarrhea, nausea and vomiting.  Genitourinary: Negative for difficulty urinating and menstrual problem.  Musculoskeletal: Positive for arthralgias, back pain and myalgias.  Skin: Negative for rash.  Neurological: Positive for numbness (pain/right leg). Negative for dizziness, weakness and headaches.  Hematological: Negative for adenopathy. Does not bruise/bleed easily.  Psychiatric/Behavioral: Negative for sleep disturbance and suicidal ideas. The patient is not nervous/anxious.     CBC:  Lab Results  Component Value Date   WBC 6.0 02/19/2020   HGB 12.9 02/19/2020   HCT 37.1 02/19/2020   MCHC 34.8 02/19/2020   RDW 13.9 02/19/2020   PLT 246.0 02/19/2020   CMP: Lab Results  Component Value Date   NA 139 05/08/2020   K 4.2 05/08/2020   CL 106 05/08/2020   CO2 25 05/08/2020   GLUCOSE 120 (H) 05/08/2020   GLUCOSE 102 (H) 09/26/2006   BUN 13 05/08/2020   CREATININE 0.89 05/08/2020   GFRAA 98 09/28/2007   CALCIUM 11.2 (H) 05/08/2020   PROT 7.6 05/08/2020   BILITOT 0.7  05/08/2020   ALKPHOS 51 02/19/2020   ALT 10 05/08/2020   AST 14 05/08/2020   LIPID: Lab Results  Component Value Date   CHOL 199 05/08/2020   TRIG 77 05/08/2020   TRIG 95 09/26/2006   HDL 56 05/08/2020   LDLCALC 126 (H) 05/08/2020    Objective:  BP 112/80 (BP Location: Left Arm, Patient Position: Sitting, Cuff Size: Normal)   Pulse 81   Temp 98.8 F (37.1 C) (Oral)   Ht 5' 1" (1.549 m)   Wt 152 lb 12.8 oz (69.3 kg)   BMI 28.87 kg/m   Weight: 152 lb 12.8 oz (69.3 kg)   BP Readings from  Last 3 Encounters:  08/25/20 112/80  08/11/20 130/78  06/10/20 124/72   Wt Readings from Last 3 Encounters:  08/25/20 152 lb 12.8 oz (69.3 kg)  08/11/20 154 lb 8 oz (70.1 kg)  06/10/20 156 lb 8 oz (71 kg)    Physical Exam Constitutional:      General: She is not in acute distress.    Appearance: She is well-developed.  HENT:     Head: Normocephalic and atraumatic.     Right Ear: External ear normal.     Left Ear: External ear normal.     Mouth/Throat:     Pharynx: No oropharyngeal exudate.  Eyes:     Conjunctiva/sclera: Conjunctivae normal.     Pupils: Pupils are equal, round, and reactive to light.  Neck:     Thyroid: No thyromegaly.  Cardiovascular:     Rate and Rhythm: Normal rate and regular rhythm.     Heart sounds: Normal heart sounds. No murmur heard.  No friction rub. No gallop.   Pulmonary:     Effort: Pulmonary effort is normal.     Breath sounds: Normal breath sounds.  Abdominal:     General: Bowel sounds are normal. There is no distension.     Palpations: Abdomen is soft. There is no mass.     Tenderness: There is no abdominal tenderness. There is no guarding.     Hernia: No hernia is present.  Musculoskeletal:        General: No tenderness or deformity. Normal range of motion.     Cervical back: Normal range of motion and neck supple.  Lymphadenopathy:     Cervical: No cervical adenopathy.  Skin:    General: Skin is warm and dry.     Findings: No rash.   Neurological:     Mental Status: She is alert and oriented to person, place, and time.     Deep Tendon Reflexes: Reflexes normal.     Reflex Scores:      Tricep reflexes are 2+ on the right side and 2+ on the left side.      Bicep reflexes are 2+ on the right side and 2+ on the left side.      Brachioradialis reflexes are 2+ on the right side and 2+ on the left side.      Patellar reflexes are 2+ on the right side and 2+ on the left side. Psychiatric:        Speech: Speech normal.        Behavior: Behavior normal.        Thought Content: Thought content normal.     Assessment/Plan: Health Maintenance Due  Topic Date Due  . MAMMOGRAM  04/18/2020   Health Maintenance reviewed.  1. Preventative health care Restart exercise now that foot is doing better. Back on track with healthier eating.  2. Essential hypertension Well controlled. Continue current medication.  - CBC with Differential/Platelet; Future - Comprehensive metabolic panel; Future  3. Hyperparathyroidism ([place]) Follows with endocrinology.  - PTH, Intact and Calcium; Future - VITAMIN D 25 Hydroxy (Vit-D Deficiency, Fractures); Future  4. Vitamin D deficiency Continue with over-the-counter supplementation per endocrinology suggestion (1000 units daily).  We will recheck levels today.  5. Osteopenia of necks of both femurs Weightbearing exercise.  We will continue to monitor vitamin D and calcium levels.  Bone density is being monitored by gynecology.  6. Muscle cramps She is having some increased muscle discomfort, wondering if from statin.  Have suggested holding the  statin for 2 weeks to see if this helps.  We have discussed benefits of statin, especially in light of diabetes diagnosis.  Treatment determination pending patient response to holding statin as well as blood work. - CK; Future  7. Controlled type 2 diabetes mellitus with other specified complication, without long-term current use of insulin (HCC) We  will recheck A1c today.  She states she has not been as compliant with diet recently. - Hemoglobin A1c; Future - Comprehensive metabolic panel; Future  8. Dyslipidemia Going to hold statin and see if this helps with muscle aches/cramps/memory. - Lipid panel; Future  9. Chest pain, unspecified type ekg bradycardia, no acute changes. Stable.  - EKG 12-Lead  Return in about 6 months (around 02/22/2021) for Chronic condition visit.  Micheline Rough, MD

## 2020-08-25 NOTE — Patient Instructions (Addendum)
Hold the pravastatin x 2 weeks to see if cramping goes away. Pneumococcal Conjugate Vaccine suspension for injection What is this medicine? PNEUMOCOCCAL VACCINE (NEU mo KOK al vak SEEN) is a vaccine used to prevent pneumococcus bacterial infections. These bacteria can cause serious infections like pneumonia, meningitis, and blood infections. This vaccine will lower your chance of getting pneumonia. If you do get pneumonia, it can make your symptoms milder and your illness shorter. This vaccine will not treat an infection and will not cause infection. This vaccine is recommended for infants and young children, adults with certain medical conditions, and adults 2 years or older. This medicine may be used for other purposes; ask your health care provider or pharmacist if you have questions. COMMON BRAND NAME(S): Prevnar, Prevnar 13 What should I tell my health care provider before I take this medicine? They need to know if you have any of these conditions:  bleeding problems  fever  immune system problems  an unusual or allergic reaction to pneumococcal vaccine, diphtheria toxoid, other vaccines, latex, other medicines, foods, dyes, or preservatives  pregnant or trying to get pregnant  breast-feeding How should I use this medicine? This vaccine is for injection into a muscle. It is given by a health care professional. A copy of Vaccine Information Statements will be given before each vaccination. Read this sheet carefully each time. The sheet may change frequently. Talk to your pediatrician regarding the use of this medicine in children. While this drug may be prescribed for children as young as 99 weeks old for selected conditions, precautions do apply. Overdosage: If you think you have taken too much of this medicine contact a poison control center or emergency room at once. NOTE: This medicine is only for you. Do not share this medicine with others. What if I miss a dose? It is important  not to miss your dose. Call your doctor or health care professional if you are unable to keep an appointment. What may interact with this medicine?  medicines for cancer chemotherapy  medicines that suppress your immune function  steroid medicines like prednisone or cortisone This list may not describe all possible interactions. Give your health care provider a list of all the medicines, herbs, non-prescription drugs, or dietary supplements you use. Also tell them if you smoke, drink alcohol, or use illegal drugs. Some items may interact with your medicine. What should I watch for while using this medicine? Mild fever and pain should go away in 3 days or less. Report any unusual symptoms to your doctor or health care professional. What side effects may I notice from receiving this medicine? Side effects that you should report to your doctor or health care professional as soon as possible:  allergic reactions like skin rash, itching or hives, swelling of the face, lips, or tongue  breathing problems  confused  fast or irregular heartbeat  fever over 102 degrees F  seizures  unusual bleeding or bruising  unusual muscle weakness Side effects that usually do not require medical attention (report to your doctor or health care professional if they continue or are bothersome):  aches and pains  diarrhea  fever of 102 degrees F or less  headache  irritable  loss of appetite  pain, tender at site where injected  trouble sleeping This list may not describe all possible side effects. Call your doctor for medical advice about side effects. You may report side effects to FDA at 1-800-FDA-1088. Where should I keep my medicine? This does  not apply. This vaccine is given in a clinic, pharmacy, doctor's office, or other health care setting and will not be stored at home. NOTE: This sheet is a summary. It may not cover all possible information. If you have questions about this  medicine, talk to your doctor, pharmacist, or health care provider.  2020 Elsevier/Gold Standard (2014-07-11 10:27:27)

## 2020-08-26 LAB — CBC WITH DIFFERENTIAL/PLATELET
Absolute Monocytes: 395 cells/uL (ref 200–950)
Basophils Absolute: 41 cells/uL (ref 0–200)
Basophils Relative: 0.7 %
Eosinophils Absolute: 100 cells/uL (ref 15–500)
Eosinophils Relative: 1.7 %
HCT: 41.5 % (ref 35.0–45.0)
Hemoglobin: 13.5 g/dL (ref 11.7–15.5)
Lymphs Abs: 2112 cells/uL (ref 850–3900)
MCH: 27.3 pg (ref 27.0–33.0)
MCHC: 32.5 g/dL (ref 32.0–36.0)
MCV: 84 fL (ref 80.0–100.0)
MPV: 11.4 fL (ref 7.5–12.5)
Monocytes Relative: 6.7 %
Neutro Abs: 3251 cells/uL (ref 1500–7800)
Neutrophils Relative %: 55.1 %
Platelets: 290 10*3/uL (ref 140–400)
RBC: 4.94 10*6/uL (ref 3.80–5.10)
RDW: 13.4 % (ref 11.0–15.0)
Total Lymphocyte: 35.8 %
WBC: 5.9 10*3/uL (ref 3.8–10.8)

## 2020-08-26 LAB — COMPREHENSIVE METABOLIC PANEL
AG Ratio: 1.3 (calc) (ref 1.0–2.5)
ALT: 12 U/L (ref 6–29)
AST: 13 U/L (ref 10–35)
Albumin: 4.3 g/dL (ref 3.6–5.1)
Alkaline phosphatase (APISO): 56 U/L (ref 37–153)
BUN: 16 mg/dL (ref 7–25)
CO2: 28 mmol/L (ref 20–32)
Calcium: 10.9 mg/dL — ABNORMAL HIGH (ref 8.6–10.4)
Chloride: 106 mmol/L (ref 98–110)
Creat: 0.72 mg/dL (ref 0.50–0.99)
Globulin: 3.2 g/dL (calc) (ref 1.9–3.7)
Glucose, Bld: 105 mg/dL — ABNORMAL HIGH (ref 65–99)
Potassium: 4.6 mmol/L (ref 3.5–5.3)
Sodium: 139 mmol/L (ref 135–146)
Total Bilirubin: 0.5 mg/dL (ref 0.2–1.2)
Total Protein: 7.5 g/dL (ref 6.1–8.1)

## 2020-08-26 LAB — LIPID PANEL
Cholesterol: 195 mg/dL (ref <200)
HDL: 59 mg/dL (ref >=50)
LDL Cholesterol (Calc): 116 mg/dL (calc) — ABNORMAL HIGH
Non-HDL Cholesterol (Calc): 136 mg/dL (calc) — ABNORMAL HIGH (ref <130)
Total CHOL/HDL Ratio: 3.3 (calc) (ref <5.0)
Triglycerides: 92 mg/dL (ref <150)

## 2020-08-26 LAB — VITAMIN D 25 HYDROXY (VIT D DEFICIENCY, FRACTURES): Vit D, 25-Hydroxy: 26 ng/mL — ABNORMAL LOW (ref 30–100)

## 2020-08-26 LAB — CK: Total CK: 105 U/L (ref 29–143)

## 2020-08-26 LAB — HEMOGLOBIN A1C
Hgb A1c MFr Bld: 6.4 % of total Hgb — ABNORMAL HIGH (ref <5.7)
Mean Plasma Glucose: 137 (calc)
eAG (mmol/L): 7.6 (calc)

## 2020-08-27 ENCOUNTER — Telehealth: Payer: Self-pay | Admitting: Family Medicine

## 2020-08-27 NOTE — Telephone Encounter (Signed)
I don't even have all the results yet. Still awaiting the parathyroid. I would like to wait until I have everything and then send one message with all results. I usually advise a week for result note to return. I will go ahead and comment on what I have thus far if she wants to know now though. Please see result note.

## 2020-08-27 NOTE — Telephone Encounter (Signed)
Pt would like a call to explain her lab results  [phone]. pt said she usually would receive  a call

## 2020-08-28 LAB — PTH, INTACT AND CALCIUM
Calcium: 10.9 mg/dL — ABNORMAL HIGH (ref 8.6–10.4)
PTH: 76 pg/mL — ABNORMAL HIGH (ref 14–64)

## 2020-08-28 NOTE — Telephone Encounter (Signed)
Pt is calling in to check the status of her labs she is aware that the provider has not gotten them back but when she does and have resulted them someone will give her a call to go over them.  Pt is aware that when [facility] did the upgrade the results was released to them first and then the provider would get them to result them.  Pt verbalized understanding as to why she has not gotten a call back from her provider.

## 2020-08-29 NOTE — Telephone Encounter (Signed)
See results note. 

## 2020-09-17 ENCOUNTER — Encounter: Payer: Self-pay | Admitting: Family Medicine

## 2020-09-29 ENCOUNTER — Telehealth: Payer: Self-pay | Admitting: Family Medicine

## 2020-09-29 NOTE — Telephone Encounter (Signed)
Patient is calling and requesting a call back regarding covid and having cramping in her legs, please advise. CB is [phone]

## 2020-09-30 NOTE — Telephone Encounter (Signed)
Spoke with the pt and she stated she was exposed to a child last week that she was told may have Covid.  Stated she later found out the results were negative, she had a home test that was negative as well and asked if she should have another test?  I advised the pt it is up to her if she has another test and offered a virtual visit for more advice.  Patient declined as her previous insurance did not cover virtual visits and she will call to find out this information prior to scheduling a visit.

## 2020-10-02 ENCOUNTER — Other Ambulatory Visit: Payer: Managed Care, Other (non HMO)

## 2020-10-03 ENCOUNTER — Other Ambulatory Visit: Payer: Managed Care, Other (non HMO)

## 2020-10-20 ENCOUNTER — Other Ambulatory Visit: Payer: Managed Care, Other (non HMO)

## 2020-11-17 ENCOUNTER — Other Ambulatory Visit: Payer: Self-pay | Admitting: Internal Medicine

## 2020-11-17 ENCOUNTER — Other Ambulatory Visit: Payer: Self-pay | Admitting: Family Medicine

## 2020-11-24 ENCOUNTER — Other Ambulatory Visit: Payer: Self-pay | Admitting: Family Medicine

## 2020-11-24 DIAGNOSIS — E1165 Type 2 diabetes mellitus with hyperglycemia: Secondary | ICD-10-CM

## 2020-11-24 NOTE — Telephone Encounter (Signed)
Pt is calling in needing a refill on Rx Contour Next Test strips stating she is out.  Pharm:  [facility] on [address]

## 2020-12-05 ENCOUNTER — Other Ambulatory Visit: Payer: Self-pay

## 2020-12-08 ENCOUNTER — Other Ambulatory Visit: Payer: Self-pay

## 2020-12-08 ENCOUNTER — Ambulatory Visit: Payer: Managed Care, Other (non HMO) | Admitting: Family Medicine

## 2020-12-08 ENCOUNTER — Encounter: Payer: Self-pay | Admitting: Family Medicine

## 2020-12-08 VITALS — BP 128/70 | HR 60 | Temp 98.8°F | Ht 61.0 in | Wt 158.2 lb

## 2020-12-08 DIAGNOSIS — I10 Essential (primary) hypertension: Secondary | ICD-10-CM

## 2020-12-08 DIAGNOSIS — M5441 Lumbago with sciatica, right side: Secondary | ICD-10-CM

## 2020-12-08 DIAGNOSIS — R351 Nocturia: Secondary | ICD-10-CM

## 2020-12-08 DIAGNOSIS — E213 Hyperparathyroidism, unspecified: Secondary | ICD-10-CM | POA: Diagnosis not present

## 2020-12-08 DIAGNOSIS — M85852 Other specified disorders of bone density and structure, left thigh: Secondary | ICD-10-CM

## 2020-12-08 DIAGNOSIS — E1165 Type 2 diabetes mellitus with hyperglycemia: Secondary | ICD-10-CM

## 2020-12-08 DIAGNOSIS — M85851 Other specified disorders of bone density and structure, right thigh: Secondary | ICD-10-CM | POA: Diagnosis not present

## 2020-12-08 DIAGNOSIS — E785 Hyperlipidemia, unspecified: Secondary | ICD-10-CM | POA: Diagnosis not present

## 2020-12-08 DIAGNOSIS — G8929 Other chronic pain: Secondary | ICD-10-CM

## 2020-12-08 LAB — COMPREHENSIVE METABOLIC PANEL
ALT: 11 U/L (ref 0–35)
AST: 14 U/L (ref 0–37)
Albumin: 4.2 g/dL (ref 3.5–5.2)
Alkaline Phosphatase: 49 U/L (ref 39–117)
BUN: 12 mg/dL (ref 6–23)
CO2: 28 mEq/L (ref 19–32)
Calcium: 10.9 mg/dL — ABNORMAL HIGH (ref 8.4–10.5)
Chloride: 104 mEq/L (ref 96–112)
Creatinine, Ser: 0.75 mg/dL (ref 0.40–1.20)
GFR: 84.6 mL/min (ref >60.00)
Glucose, Bld: 137 mg/dL — ABNORMAL HIGH (ref 70–99)
Potassium: 4.5 mEq/L (ref 3.5–5.1)
Sodium: 139 mEq/L (ref 135–145)
Total Bilirubin: 0.4 mg/dL (ref 0.2–1.2)
Total Protein: 7.8 g/dL (ref 6.0–8.3)

## 2020-12-08 LAB — CBC WITH DIFFERENTIAL/PLATELET
Basophils Absolute: 0 10*3/uL (ref 0.0–0.1)
Basophils Relative: 0.5 % (ref 0.0–3.0)
Eosinophils Absolute: 0.1 10*3/uL (ref 0.0–0.7)
Eosinophils Relative: 1 % (ref 0.0–5.0)
HCT: 37.9 % (ref 36.0–46.0)
Hemoglobin: 13.3 g/dL (ref 12.0–15.0)
Lymphocytes Relative: 34.1 % (ref 12.0–46.0)
Lymphs Abs: 1.9 10*3/uL (ref 0.7–4.0)
MCHC: 34.9 g/dL (ref 30.0–36.0)
MCV: 79.7 fl (ref 78.0–100.0)
Monocytes Absolute: 0.4 10*3/uL (ref 0.1–1.0)
Monocytes Relative: 6.4 % (ref 3.0–12.0)
Neutro Abs: 3.2 10*3/uL (ref 1.4–7.7)
Neutrophils Relative %: 58 % (ref 43.0–77.0)
Platelets: 271 10*3/uL (ref 150.0–400.0)
RBC: 4.76 Mil/uL (ref 3.87–5.11)
RDW: 14.1 % (ref 11.5–15.5)
WBC: 5.6 10*3/uL (ref 4.0–10.5)

## 2020-12-08 LAB — LIPID PANEL
Cholesterol: 255 mg/dL — ABNORMAL HIGH (ref 0–200)
HDL: 58.4 mg/dL (ref >39.00)
LDL Cholesterol: 181 mg/dL — ABNORMAL HIGH (ref 0–99)
NonHDL: 196.87
Total CHOL/HDL Ratio: 4
Triglycerides: 78 mg/dL (ref 0.0–149.0)
VLDL: 15.6 mg/dL (ref 0.0–40.0)

## 2020-12-08 LAB — HEMOGLOBIN A1C: Hgb A1c MFr Bld: 6.8 % — ABNORMAL HIGH (ref 4.6–6.5)

## 2020-12-08 NOTE — Progress Notes (Signed)
Rebecca Paul DOB: 10-23-1955 Encounter date: 12/08/2020  This is a 65 y.o. female who presents with Chief Complaint  Patient presents with  . Follow-up    History of present illness: States that she doesn't have concerns today, but worried about numbers being off. Wasn't good over christmas. Home weight was 7lb less than weight in office today.   Primary hyperparathyroidism: Follows with endocrinology.  Taking in 2-3 servings of calcium in the diet and avoiding over-the-counter replacement.  Hypertension: Amlodipine 5 mg, losartan 100 mg  Hyperlipidemia: Had increased muscle aches from pravastatin 40 mg.  Advised her to hold this for 2 weeks to see if cramping went away at last visit. Didn't go back on the cholesterol medication. Doesn't think this made difference for her. She still had pains in legs and has been following with ortho.   Type 2 diabetes: Has been diet controlled. Hasn't used the freestyle monitor; just hasn't been in mood to do this. Has just been using old monitor. Numbers in morning have been high - even up in 200's; 160. Not sure if different machine she was using. Today at home number was 143. Previously she was set up 90-120 fasting and then up to 140/50 after eating. Records different than last meter. Does feel like she is back in routine now that past holidays. Was getting tired of just being diabetic and checking all the time. She knows what triggers sugars to be worse.   She has been having back issues with neuropathy on right leg down to foot. She is going to PT for this. Pain does come and go. This week she was feeling better, but then will flare sometimes - like with excessive walking. She has had medrol pack, muscle relaxers in past. Trying to avoid surgery.   Has been going to the bathroom a lot at night. Tried to drink less, but didn't seem to matter.   Osteopenia: Managed by gynecology.  No Known Allergies Current Meds  Medication Sig  . Blood Glucose  Monitoring Suppl (CONTOUR BLOOD GLUCOSE SYSTEM) w/Device KIT by Does not apply route.  . Cyanocobalamin (VITAMIN B 12 PO) Take by mouth daily.    Review of Systems  Constitutional: Negative for chills, fatigue and fever.  Respiratory: Negative for cough, chest tightness, shortness of breath and wheezing.   Cardiovascular: Negative for chest pain, palpitations and leg swelling.  Genitourinary: Negative for difficulty urinating and dysuria.       Increased night time urination    Objective:  BP 128/70 (BP Location: Right Arm, Patient Position: Sitting, Cuff Size: Normal)   Pulse 60   Temp 98.8 F (37.1 C) (Oral)   Ht 5' 1"  (1.549 m)   Wt 158 lb 3.2 oz (71.8 kg)   SpO2 94%   BMI 29.89 kg/m   Weight: 158 lb 3.2 oz (71.8 kg)   BP Readings from Last 3 Encounters:  12/08/20 128/70  08/25/20 112/80  08/11/20 130/78   Wt Readings from Last 3 Encounters:  12/08/20 158 lb 3.2 oz (71.8 kg)  08/25/20 152 lb 12.8 oz (69.3 kg)  08/11/20 154 lb 8 oz (70.1 kg)    Physical Exam Constitutional:      General: She is not in acute distress.    Appearance: She is well-developed and well-nourished.  HENT:     Head: Normocephalic and atraumatic.  Cardiovascular:     Rate and Rhythm: Normal rate and regular rhythm.     Pulses: Intact distal pulses.  Heart sounds: Normal heart sounds. No murmur heard.   Pulmonary:     Effort: Pulmonary effort is normal.     Breath sounds: Normal breath sounds.  Abdominal:     General: Bowel sounds are normal. There is no distension.     Palpations: Abdomen is soft.     Tenderness: There is no abdominal tenderness. There is no guarding.  Skin:    General: Skin is warm and dry.     Comments: Sensory exam of the foot is normal, tested with the monofilament. Good pulses, no lesions or ulcers, good peripheral pulses.  Psychiatric:        Mood and Affect: Mood and affect normal.        Judgment: Judgment normal.     Assessment/Plan  1. Essential  hypertension Well controlled.  Continue with losartan 179m daily.  - CBC with Differential/Platelet; Future - Comprehensive metabolic panel; Future  2. Controlled type 2 diabetes mellitus with hyperglycemia, without long-term [place] use of insulin [place] recheck bloodwork today.  - Hemoglobin A1c; Future  3. Hyperparathyroidism ([facility] Following with endocrinology.  Has follow-up appointment in 2 months time.  4. Osteopenia of necks of both femurs Encouraged getting back to weightbearing exercise.  She is regularly taking vitamin D.  5. Dyslipidemia She did not notice any improvement with holding pravastatin in terms of muscle cramping.  We will recheck lipids today and determine if dose adjustment is needed.  We discussed LDL goal with diabetes. - Lipid panel; Future  6. 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  Continue with this.  She is following with specialist.  7. Nocturia - Urinalysis with Culture Reflex; Future    Return for Pending blood work..Micheline Rough MD

## 2020-12-09 LAB — URINALYSIS W MICROSCOPIC + REFLEX CULTURE
Bacteria, UA: NONE SEEN /HPF
Bilirubin Urine: NEGATIVE
Glucose, UA: NEGATIVE
Hgb urine dipstick: NEGATIVE
Hyaline Cast: NONE SEEN /LPF
Ketones, ur: NEGATIVE
Leukocyte Esterase: NEGATIVE
Nitrites, Initial: NEGATIVE
Protein, ur: NEGATIVE
RBC / HPF: NONE SEEN /HPF (ref 0–2)
Specific Gravity, Urine: 1.017 (ref 1.001–1.03)
pH: 7 (ref 5.0–8.0)

## 2020-12-09 LAB — NO CULTURE INDICATED

## 2020-12-10 MED ORDER — PRAVASTATIN SODIUM 40 MG PO TABS: 40.0000 mg | ORAL_TABLET | Freq: Every day | ORAL | 1 refills | Status: DC

## 2020-12-10 NOTE — Addendum Note (Signed)
Addended by: Agnes Lawrence on: 12/10/2020 04:34 PM   Modules accepted: Orders

## 2020-12-12 ENCOUNTER — Encounter: Payer: Self-pay | Admitting: Family Medicine

## 2020-12-29 ENCOUNTER — Other Ambulatory Visit: Payer: Self-pay | Admitting: Family Medicine

## 2021-01-19 ENCOUNTER — Ambulatory Visit: Payer: Managed Care, Other (non HMO) | Admitting: Family Medicine

## 2021-01-19 ENCOUNTER — Ambulatory Visit
Admission: RE | Admit: 2021-01-19 | Discharge: 2021-01-19 | Disposition: A | Discharge status: Home / Self Care | Payer: Managed Care, Other (non HMO) | Source: Ambulatory Visit | Attending: Internal Medicine | Admitting: Internal Medicine

## 2021-01-19 ENCOUNTER — Other Ambulatory Visit: Payer: Self-pay | Admitting: Internal Medicine

## 2021-01-19 ENCOUNTER — Encounter: Payer: Self-pay | Admitting: Family Medicine

## 2021-01-19 ENCOUNTER — Other Ambulatory Visit: Payer: Self-pay

## 2021-01-19 VITALS — BP 120/60 | HR 93 | Temp 97.9°F | Wt 156.1 lb

## 2021-01-19 DIAGNOSIS — R350 Frequency of micturition: Secondary | ICD-10-CM | POA: Diagnosis not present

## 2021-01-19 DIAGNOSIS — E213 Hyperparathyroidism, unspecified: Secondary | ICD-10-CM

## 2021-01-19 LAB — POCT URINALYSIS DIPSTICK
Bilirubin, UA: NEGATIVE
Blood, UA: POSITIVE
Glucose, UA: NEGATIVE
Ketones, UA: NEGATIVE
Leukocytes, UA: NEGATIVE
Nitrite, UA: NEGATIVE
Protein, UA: NEGATIVE
Spec Grav, UA: 1.025 (ref 1.010–1.025)
Urobilinogen, UA: 0.2 E.U./dL
pH, UA: 5.5 (ref 5.0–8.0)

## 2021-01-19 NOTE — Progress Notes (Signed)
Established Patient Office Visit  Subjective:  Patient ID: Rebecca Paul, female    DOB: [birth year]  Age: 65 y.o. MRN: [id number]  CC:  Chief Complaint  Patient presents with  . Urinary Tract Infection    Dysuria, nocturia, frequency    HPI Rebecca Paul presents for concern for some urine frequency which has been going on for a few months now.  She mostly is having issues with getting up about 3-4 times at night.  No burning with urination.  No hematuria.  No fevers or chills.  She drinks usually just 1 serving of caffeine per day.  No regular alcohol use.  Tries to avoid excessive late the use of fluids.  She does have history of hyperglycemia but recent A1c was 6.8% and fairly well controlled.  Past Medical History:  Diagnosis Date  . Allergy    more sinus issues   . Anemia    yrs past as child , with pregnancy   . Arthritis    shoulders - not diagnosed   . Headache(784.0)   . Hyperlipidemia   . Hypertension   . IGT (impaired glucose tolerance)   . Kidney stone   . Osteopenia   . Plantar fasciitis     Past Surgical History:  Procedure Laterality Date  . ABDOMINAL HYSTERECTOMY     fibroids  . CESAREAN SECTION    . COLONOSCOPY  01/2018   normal  . OOPHORECTOMY     dermoid tumor  . TONSILLECTOMY AND ADENOIDECTOMY      Family History  Problem Relation Age of Onset  . Cancer Father        prostate  . Hypertension Mother   . Hyperlipidemia Mother   . Thyroid disease Mother   . Diabetes Mother   . Obesity Sister   . Diabetes Sister   . Diabetes Other   . Hyperlipidemia Other   . Hypertension Other   . Cancer Other        prostate  . Kidney disease Other   . Obesity Sister   . Heart attack Sister   . Diabetes Maternal Grandmother   . Colon cancer Neg Hx   . Colon polyps Neg Hx   . Rectal cancer Neg Hx   . Stomach cancer Neg Hx     Social History   Socioeconomic History  . Marital status: Widowed    Spouse name: Not on file  . Number of  children: Not on file  . Years of education: Not on file  . Highest education level: Not on file  Occupational History  . Not on file  Tobacco Use  . Smoking status: Never Smoker  . Smokeless tobacco: Never Used  Vaping Use  . Vaping Use: Never used  Substance and Sexual Activity  . Alcohol use: Yes    Comment: rare glass of wine   . Drug use: No  . Sexual activity: Not on file  Other Topics Concern  . Not on file  Social History Narrative  . Not on file   Social Determinants of Health   Financial Resource Strain: Not on file  Food Insecurity: Not on file  Transportation Needs: Not on file  Physical Activity: Not on file  Stress: Not on file  Social Connections: Not on file  Intimate Partner Violence: Not on file    Outpatient Medications Prior to Visit  Medication Sig Dispense Refill  . amLODipine (NORVASC) 5 MG tablet TAKE 1 TABLET BY MOUTH EVERY DAY 90  tablet 3  . blood glucose meter kit and supplies KIT Dispense based on patient and insurance preference. Use up to four times daily as directed. (FOR ICD-9 250.00, 250.01). 1 each 0  . blood glucose meter kit and supplies Use as directed once a day 1 each 0  . Blood Glucose Monitoring Suppl (CONTOUR BLOOD GLUCOSE SYSTEM) w/Device KIT by Does not apply route.    . cholecalciferol (VITAMIN D) 1000 units tablet Take 1,000 Units by mouth daily.    . Continuous Blood Gluc Sensor (FREESTYLE LIBRE 14 DAY SENSOR) MISC 1 Device by Does not apply route every 14 (fourteen) days. 6 each 3  . CONTOUR NEXT TEST test strip USE AS INSTRUCTED 100 strip 12  . Cyanocobalamin (VITAMIN B 12 PO) Take by mouth daily.    Marland Kitchen ELDERBERRY PO Take by mouth.    . famciclovir (FAMVIR) 250 MG tablet Take 1 tablet (250 mg total) by mouth 2 (two) times daily as needed. 90 tablet 1  . fluticasone (FLONASE) 50 MCG/ACT nasal spray SPRAY 2 SPRAYS INTO EACH NOSTRIL EVERY DAY 32 mL 3  . losartan (COZAAR) 100 MG tablet TAKE 1 TABLET BY MOUTH EVERY DAY 90 tablet 3   . pravastatin (PRAVACHOL) 40 MG tablet TAKE 1 TABLET BY MOUTH EVERY DAY 90 tablet 1  . vitamin C (ASCORBIC ACID) 500 MG tablet Take 1,000 mg by mouth daily.     . vitamin E 100 UNIT capsule Take by mouth daily.    . pravastatin (PRAVACHOL) 40 MG tablet Take 1 tablet (40 mg total) by mouth daily. 90 tablet 1   No facility-administered medications prior to visit.    No Known Allergies  ROS Review of Systems  Constitutional: Negative for chills and fever.  Gastrointestinal: Negative for abdominal pain.  Genitourinary: Positive for frequency. Negative for difficulty urinating, flank pain and hematuria.      Objective:    Physical Exam Vitals reviewed.  Constitutional:      Appearance: Normal appearance.  Cardiovascular:     Rate and Rhythm: Normal rate and regular rhythm.  Pulmonary:     Effort: Pulmonary effort is normal.     Breath sounds: Normal breath sounds.  Neurological:     Mental Status: She is alert.     BP 120/60 (BP Location: Left Arm, Patient Position: Sitting, Cuff Size: Normal)   Pulse 93   Temp 97.9 F (36.6 C) (Oral)   Wt 156 lb 1.6 oz (70.8 kg)   SpO2 94%   BMI 29.49 kg/m  Wt Readings from Last 3 Encounters:  01/19/21 156 lb 1.6 oz (70.8 kg)  12/08/20 158 lb 3.2 oz (71.8 kg)  08/25/20 152 lb 12.8 oz (69.3 kg)     Health Maintenance Due  Topic Date Due  . FOOT EXAM  10/28/2020    There are no preventive care reminders to display for this patient.  Lab Results  Component Value Date   TSH 1.02 09/18/2018   Lab Results  Component Value Date   WBC 5.6 12/08/2020   HGB 13.3 12/08/2020   HCT 37.9 12/08/2020   MCV 79.7 12/08/2020   PLT 271.0 12/08/2020   Lab Results  Component Value Date   NA 139 12/08/2020   K 4.5 12/08/2020   CO2 28 12/08/2020   GLUCOSE 137 (H) 12/08/2020   BUN 12 12/08/2020   CREATININE 0.75 12/08/2020   BILITOT 0.4 12/08/2020   ALKPHOS 49 12/08/2020   AST 14 12/08/2020   ALT 11 12/08/2020  PROT 7.8  12/08/2020   ALBUMIN 4.2 12/08/2020   CALCIUM 10.9 (H) 12/08/2020   GFR 84.60 12/08/2020   Lab Results  Component Value Date   CHOL 255 (H) 12/08/2020   Lab Results  Component Value Date   HDL 58.40 12/08/2020   Lab Results  Component Value Date   LDLCALC 181 (H) 12/08/2020   Lab Results  Component Value Date   TRIG 78.0 12/08/2020   Lab Results  Component Value Date   CHOLHDL 4 12/08/2020   Lab Results  Component Value Date   HGBA1C 6.8 (H) 12/08/2020      Assessment & Plan:   Urine frequency without burning.  Urine dipstick today shows positive blood but negative leukocytes and negative nitrites  -Send urine for culture and microscopy -If above negative consider medications for urgency -Avoid caffeine after about 1 PM and avoid excessive fluids at night  No orders of the defined types were placed in this encounter.   Follow-up: No follow-ups on file.    Carolann Littler, MD

## 2021-01-20 LAB — URINE CULTURE
MICRO NUMBER:: 11727593
SPECIMEN QUALITY:: ADEQUATE

## 2021-01-20 LAB — URINALYSIS, MICROSCOPIC ONLY

## 2021-01-21 ENCOUNTER — Other Ambulatory Visit: Payer: Self-pay | Admitting: Family Medicine

## 2021-01-21 DIAGNOSIS — R319 Hematuria, unspecified: Secondary | ICD-10-CM

## 2021-01-23 ENCOUNTER — Encounter: Payer: Self-pay | Admitting: Family Medicine

## 2021-02-09 ENCOUNTER — Ambulatory Visit (INDEPENDENT_AMBULATORY_CARE_PROVIDER_SITE_OTHER): Payer: Managed Care, Other (non HMO) | Admitting: Internal Medicine

## 2021-02-09 ENCOUNTER — Encounter: Payer: Self-pay | Admitting: Internal Medicine

## 2021-02-09 ENCOUNTER — Other Ambulatory Visit: Payer: Self-pay

## 2021-02-09 VITALS — BP 120/82 | HR 88 | Ht 61.0 in | Wt 156.4 lb

## 2021-02-09 DIAGNOSIS — E210 Primary hyperparathyroidism: Secondary | ICD-10-CM

## 2021-02-09 NOTE — Patient Instructions (Signed)
-   Please stay hydrated ?- Avoid over the counter calcium tablets ?- Consume 2-3 servings of calcium in your diet  ? ? ?

## 2021-02-09 NOTE — Progress Notes (Signed)
Name: Rebecca Paul  MRN/ DOB: [id number], 08-15-1956    Age/ Sex: 65 y.o., female     PCP: Caren Macadam, MD   Reason for Endocrinology Evaluation: Hypercalcemia      Initial Endocrinology Clinic Visit: 11/19/2019    PATIENT IDENTIFIER: Rebecca Paul is a 65 y.o., female with a past medical history of HTN and Dyslipidemia. She has followed with [facility] since 11/19/2019 for consultative assistance with management of her Hypercalcemia.   HISTORICAL SUMMARY: The patient was first diagnosed with hypercalcemia in 2019. She was on HCTZ on her initial presentation that I stopped to proceed with 24-hr urinary calcium excretion, she has had hx of kidney stones. No prior fractures or osteoporosis.  Max serum calcium was 11.2 mg/dL (non corrected) with a max PTH level of 86 pg/mL     Ca/Cr ratio 0.014 (04/2020) with 24-hr urine calcium of 260 mg DXA (2021) Low bone density  SUBJECTIVE:    Today (02/09/2021):  Rebecca Paul is here for a follow up on hypercalcemia  Denies polyuria nor polydipsia Denies constipation  She has been consuming 2 servings of calcium daily      Vitamin D 2000 iu daily   [facility] [place] [facility]rology for groin pain , scheduled to have a CT scan    HISTORY:  Past Medical History:  Past Medical History:  Diagnosis Date  . Allergy    more sinus issues   . Anemia    yrs past as child , with pregnancy   . Arthritis    shoulders - not diagnosed   . Headache(784.0)   . Hyperlipidemia   . Hypertension   . IGT (impaired glucose tolerance)   . Kidney stone   . Osteopenia   . Plantar fasciitis    Past Surgical History:  Past Surgical History:  Procedure Laterality Date  . ABDOMINAL HYSTERECTOMY     fibroids  . CESAREAN SECTION    . COLONOSCOPY  01/2018   normal  . OOPHORECTOMY     dermoid tumor  . TONSILLECTOMY AND ADENOIDECTOMY     Social History:  reports that she has never smoked. She has never used smokeless tobacco. She  reports current alcohol use. She reports that she does not use drugs. Family History:  Family History  Problem Relation Age of Onset  . Cancer Father        prostate  . Hypertension Mother   . Hyperlipidemia Mother   . Thyroid disease Mother   . Diabetes Mother   . Obesity Sister   . Diabetes Sister   . Diabetes Other   . Hyperlipidemia Other   . Hypertension Other   . Cancer Other        prostate  . Kidney disease Other   . Obesity Sister   . Heart attack Sister   . Diabetes Maternal Grandmother   . Colon cancer Neg Hx   . Colon polyps Neg Hx   . Rectal cancer Neg Hx   . Stomach cancer Neg Hx      HOME MEDICATIONS: Allergies as of 02/09/2021   No Known Allergies     Medication List       Accurate as of February 09, 2021  1:32 PM. If you have any questions, ask your nurse or doctor.        amLODipine 5 MG tablet Commonly known as: NORVASC TAKE 1 TABLET BY MOUTH EVERY DAY   blood glucose meter kit and supplies Use as  directed once a day   blood glucose meter kit and supplies Kit Dispense based on patient and insurance preference. Use up to four times daily as directed. (FOR ICD-9 250.00, 250.01).   cholecalciferol 1000 units tablet Commonly known as: VITAMIN D Take 1,000 Units by mouth daily.   Contour Blood Glucose System w/Device Kit by Does not apply route.   Contour Next Test test strip Generic drug: glucose blood USE AS INSTRUCTED   ELDERBERRY PO Take by mouth.   famciclovir 250 MG tablet Commonly known as: FAMVIR Take 1 tablet (250 mg total) by mouth 2 (two) times daily as needed.   fluticasone 50 MCG/ACT nasal spray Commonly known as: FLONASE SPRAY 2 SPRAYS INTO EACH NOSTRIL EVERY DAY   FreeStyle Libre 14 Day Sensor Misc 1 Device by Does not apply route every 14 (fourteen) days.   losartan 100 MG tablet Commonly known as: COZAAR TAKE 1 TABLET BY MOUTH EVERY DAY   pravastatin 40 MG tablet Commonly known as: PRAVACHOL TAKE 1 TABLET BY  MOUTH EVERY DAY   VITAMIN B 12 PO Take by mouth daily.   vitamin C 500 MG tablet Commonly known as: ASCORBIC ACID Take 1,000 mg by mouth daily.   vitamin E 45 MG (100 UNITS) capsule Take by mouth daily.         OBJECTIVE:   PHYSICAL EXAM: VS: BP 120/82   Pulse 88   Ht 5' 1"  (1.549 m)   Wt 156 lb 6 oz (70.9 kg)   SpO2 98%   BMI 29.55 kg/m    EXAM: General: Pt appears well and is in NAD  Lungs: Clear with good BS bilat with no rales, rhonchi, or wheezes  Heart: Auscultation: RRR.  Abdomen: Normoactive bowel sounds, soft, nontender, without masses or organomegaly palpable  Extremities:  BL LE: No pretibial edema normal ROM and strength.  Mental Status: Judgment, insight: Intact Orientation: Oriented to time, place, and person Mood and affect: No depression, anxiety, or agitation     DATA REVIEWED: Results for Rebecca Paul, Rebecca Paul (MRN [id number]) as of 02/09/2021 13:09  Ref. Range 12/08/2020 10:41  Sodium Latest Ref Range: 135 - 145 mEq/L 139  Potassium Latest Ref Range: 3.5 - 5.1 mEq/L 4.5  Chloride Latest Ref Range: 96 - 112 mEq/L 104  CO2 Latest Ref Range: 19 - 32 mEq/L 28  Glucose Latest Ref Range: 70 - 99 mg/dL 137 (H)  BUN Latest Ref Range: 6 - 23 mg/dL 12  Creatinine Latest Ref Range: 0.40 - 1.20 mg/dL 0.75  Calcium Latest Ref Range: 8.4 - 10.5 mg/dL 10.9 (H)  Alkaline Phosphatase Latest Ref Range: 39 - 117 U/L 49  Albumin Latest Ref Range: 3.5 - 5.2 g/dL 4.2  AST Latest Ref Range: 0 - 37 U/L 14  ALT Latest Ref Range: 0 - 35 U/L 11  Total Protein Latest Ref Range: 6.0 - 8.3 g/dL 7.8  Total Bilirubin Latest Ref Range: 0.2 - 1.2 mg/dL 0.4  GFR Latest Ref Range: >60.00 mL/min 84.60   Results for Rebecca Paul, Rebecca Paul (MRN [id number]) as of 02/09/2021 13:09  Ref. Range 08/25/2020 11:58  PTH, Intact Latest Ref Range: 14 - 64 pg/mL 76 (H)    DXA 04/17/2018 [facility]    T score -0.8 Left femoral neck -2.0  Right femoral neck -1.8    DXA 07/07/2020  [facility]  -1.0 LFN -2.3 RFN -2.2 ASSESSMENT / PLAN / RECOMMENDATIONS:   1. Primary Hyperparathyroidism:   -- Pt is asymptomatic  - Pt  meets criteria for parathyroidectomy based on the presence of  bilateral renal calculi on KUB , she declines surgical intervention . We discussed medical management is the second line of treatment and HCTZ would be restarted ( this was stopped by me in 11/2019 to proceed with evaluation of hypercalcemia)  - She has a pending CT scan through urology, will wait on this prior making a final decision  - Bone density shows low bone density but no osteoporosis     Recommendations:  - Maintain hydration - Avoid over-the-counter calcium tablets -Maintain 2-3 servings of  calcium in the diet - Continue Vitamin D 2000 iu daily   F/U in 6 months   Signed electronically by: Mack Guise, MD  [facility] End[facility]inology  [facility] [address] [place],  [address] Phone: [phone] FAX: [phone]      CC: Caren Macadam, [place] [address] Phone: [phone]  Fax: [phone]   Return to Endocrinology clinic as below: No future appointments.

## 2021-02-19 ENCOUNTER — Telehealth: Payer: Self-pay | Admitting: Family Medicine

## 2021-02-19 NOTE — Telephone Encounter (Signed)
Patient is calling and wanted to see if her results from her endocrinologist be sent to the urologist at [phone]. CB is [phone]

## 2021-02-20 NOTE — Telephone Encounter (Signed)
Spoke with the pt and informed her our office is not able to send results from another office to another provider and advised she contact the endo office.  Patient stated her endo told her Dr Ethlyn Gallery took her off of BP meds and she wanted to know who will put her back on this?  Message sent to PCP and the pt is aware this will be addressed next week when the PCP returns to the office.

## 2021-02-23 ENCOUNTER — Other Ambulatory Visit: Payer: Self-pay | Admitting: Family Medicine

## 2021-02-23 ENCOUNTER — Telehealth: Payer: Self-pay | Admitting: Internal Medicine

## 2021-02-23 ENCOUNTER — Telehealth: Payer: Self-pay | Admitting: Family Medicine

## 2021-02-23 DIAGNOSIS — M85851 Other specified disorders of bone density and structure, right thigh: Secondary | ICD-10-CM

## 2021-02-23 DIAGNOSIS — E210 Primary hyperparathyroidism: Secondary | ICD-10-CM

## 2021-02-23 DIAGNOSIS — M85852 Other specified disorders of bone density and structure, left thigh: Secondary | ICD-10-CM

## 2021-02-23 DIAGNOSIS — N200 Calculus of kidney: Secondary | ICD-10-CM

## 2021-02-23 DIAGNOSIS — E559 Vitamin D deficiency, unspecified: Secondary | ICD-10-CM

## 2021-02-23 DIAGNOSIS — E213 Hyperparathyroidism, unspecified: Secondary | ICD-10-CM

## 2021-02-23 NOTE — Telephone Encounter (Signed)
Referral placed; please make sure patient checks to make sure this provider is a covered provider for her. Comes up as out of network in my system.

## 2021-02-23 NOTE — Telephone Encounter (Signed)
Patient is calling and wanted to see if the provider can refer her to an endocrinologist. Patient prefers Dr. Rayetta Pigg MD at [address] in [place] [address]. Fax number is [phone]

## 2021-02-23 NOTE — Telephone Encounter (Signed)
Pt called because she states she is supposed to be having a referral for Dr.todd gerkin for a surgical consultation, she would like the Dr to know she can go ahead and send that referral over now.

## 2021-02-23 NOTE — Telephone Encounter (Signed)
A referral has been placed.

## 2021-02-23 NOTE — Telephone Encounter (Signed)
Per Dr. Quin Hoop last note; she took her off the hctz in order to do calcium testing on her for parathyroid evaluation. Is this what she is referring to? At our last visit her blood pressure was well controlled on amlodipine and losartan. If pressures have been well controlled on these medications; I am not sure we need to make a change presently.

## 2021-02-23 NOTE — Telephone Encounter (Signed)
Patient have been notified.  

## 2021-02-24 NOTE — Telephone Encounter (Signed)
Left message on machine for patient to return our call 

## 2021-02-25 NOTE — Telephone Encounter (Signed)
Patient returned call

## 2021-02-26 NOTE — Telephone Encounter (Signed)
FYI -  Patient is aware. Patient has an appointment scheduled with Dr Armandina Gemma 04/14/21 for a consult.

## 2021-02-26 NOTE — Telephone Encounter (Signed)
Patient is aware 

## 2021-04-06 ENCOUNTER — Ambulatory Visit: Payer: Managed Care, Other (non HMO) | Admitting: Family Medicine

## 2021-04-06 ENCOUNTER — Other Ambulatory Visit: Payer: Self-pay

## 2021-04-06 ENCOUNTER — Encounter: Payer: Self-pay | Admitting: Family Medicine

## 2021-04-06 VITALS — BP 140/80 | HR 67 | Temp 98.9°F | Ht 61.0 in | Wt 156.0 lb

## 2021-04-06 DIAGNOSIS — J302 Other seasonal allergic rhinitis: Secondary | ICD-10-CM | POA: Diagnosis not present

## 2021-04-06 DIAGNOSIS — E213 Hyperparathyroidism, unspecified: Secondary | ICD-10-CM | POA: Diagnosis not present

## 2021-04-06 DIAGNOSIS — E785 Hyperlipidemia, unspecified: Secondary | ICD-10-CM

## 2021-04-06 DIAGNOSIS — E1165 Type 2 diabetes mellitus with hyperglycemia: Secondary | ICD-10-CM

## 2021-04-06 LAB — COMPREHENSIVE METABOLIC PANEL
ALT: 21 U/L (ref 0–35)
AST: 17 U/L (ref 0–37)
Albumin: 4.3 g/dL (ref 3.5–5.2)
Alkaline Phosphatase: 54 U/L (ref 39–117)
BUN: 13 mg/dL (ref 6–23)
CO2: 26 mEq/L (ref 19–32)
Calcium: 10.6 mg/dL — ABNORMAL HIGH (ref 8.4–10.5)
Chloride: 105 mEq/L (ref 96–112)
Creatinine, Ser: 0.85 mg/dL (ref 0.40–1.20)
GFR: 72.63 mL/min (ref >60.00)
Glucose, Bld: 142 mg/dL — ABNORMAL HIGH (ref 70–99)
Potassium: 3.9 mEq/L (ref 3.5–5.1)
Sodium: 139 mEq/L (ref 135–145)
Total Bilirubin: 0.6 mg/dL (ref 0.2–1.2)
Total Protein: 7.7 g/dL (ref 6.0–8.3)

## 2021-04-06 LAB — LIPID PANEL
Cholesterol: 192 mg/dL (ref 0–200)
HDL: 54.3 mg/dL (ref >39.00)
LDL Cholesterol: 117 mg/dL — ABNORMAL HIGH (ref 0–99)
NonHDL: 138.03
Total CHOL/HDL Ratio: 4
Triglycerides: 107 mg/dL (ref 0.0–149.0)
VLDL: 21.4 mg/dL (ref 0.0–40.0)

## 2021-04-06 LAB — POCT GLYCOSYLATED HEMOGLOBIN (HGB A1C): Hemoglobin A1C: 6.4 % — AB (ref 4.0–5.6)

## 2021-04-06 MED ORDER — AZELASTINE-FLUTICASONE 137-50 MCG/ACT NA SUSP: 1.0000 | Freq: Two times a day (BID) | NASAL | 5 refills | Status: DC

## 2021-04-06 NOTE — Patient Instructions (Addendum)
Goal: 110-130/65-85 for blood pressure  Blood sugar: fasting goal is less than 126 (this would be normal). Should be back to this baseline 3 hours after eating.

## 2021-04-06 NOTE — Progress Notes (Signed)
Rebecca Paul DOB: 08-17-1956 Encounter date: 04/06/2021  This is a 65 y.o. female who presents with Chief Complaint  Patient presents with   Hypertension     History of present illness: Since she was here last feels like she has not been eating as well as she should be. Not eating right stuff; was on vacation as well.   Does some screening through work for health and did get money for waistline less than 35. A1C with them was 6.4. work check was done on 5/5. Total cholesterol was 143; HDL was 49.   She is not exercising as well as she should, but retiring end of next month and so plans to work on health a lot at that point.   HL: has restarted pravastatin 40mg  daily.   HTN: losartan 100mg , amlodipine 5mg . Has checked blood pressure on occasion outside of office. Didn't have medication this morning. She was on hctz in past, but this was stopped due to interference with calcium levels when being monitored by endo for parathyroid issues.   Has some sharp pains on occasion upper chest. Not daily, not consistent. Seems to note more with laying down. No heart racing, not light headed/dizzy. Never noted these with activity/exercise. Pain lasts just a second when she gets it and then completely resolves.  Hyperparathyroid: She is seeing endocrinology again at end of month. She does not want to go through with surgery. Has a second opinion appt in December through [place] with [facility] specialty clinic.   At home had UTI sx; took home test. Worried about kidney stones because she has had these in past. There was some blood in urine. Sent to urology, Dr. Gloriann Loan. Got CT scan - there was renal lesion 62mm which needs f/u 6 months. Suggested cystoscopy and has appointment tomorrow for this due to urinary frequency and she has some discomfort on and off which she feels is related to kidney stones.    No Known Allergies Current Meds  Medication Sig   Azelastine-Fluticasone 137-50 MCG/ACT SUSP Place 1  spray into the nose every 12 (twelve) hours.    Review of Systems  Constitutional:  Negative for chills, fatigue and fever.  HENT:  Positive for congestion (with PND at night; in morning coughs up phlegm x 1).   Respiratory:  Negative for cough, chest tightness, shortness of breath and wheezing.   Cardiovascular:  Negative for chest pain, palpitations and leg swelling.   Objective:  BP 140/80   Pulse 67   Temp 98.9 F (37.2 C) (Other (Comment))   Ht 5\' 1"  (1.549 m)   Wt 156 lb (70.8 kg)   SpO2 97%   BMI 29.48 kg/m   Weight: 156 lb (70.8 kg)   BP Readings from Last 3 Encounters:  04/06/21 140/80  02/09/21 120/82  01/19/21 120/60   Wt Readings from Last 3 Encounters:  04/06/21 156 lb (70.8 kg)  02/09/21 156 lb 6 oz (70.9 kg)  01/19/21 156 lb 1.6 oz (70.8 kg)    Physical Exam Constitutional:      General: She is not in acute distress.    Appearance: She is well-developed.  HENT:     Head: Normocephalic and atraumatic.     Right Ear: Tympanic membrane and ear canal normal.     Left Ear: Tympanic membrane and ear canal normal.     Nose:     Right Turbinates: Swollen and pale.     Left Turbinates: Swollen and pale.  Right Sinus: No maxillary sinus tenderness or frontal sinus tenderness.     Left Sinus: No maxillary sinus tenderness or frontal sinus tenderness.  Cardiovascular:     Rate and Rhythm: Normal rate and regular rhythm.     Heart sounds: Normal heart sounds. No murmur heard.   No friction rub.  Pulmonary:     Effort: Pulmonary effort is normal. No respiratory distress.     Breath sounds: Normal breath sounds. No wheezing or rales.  Musculoskeletal:     Cervical back: Normal range of motion and neck supple. No tenderness.     Right lower leg: No edema.     Left lower leg: No edema.  Neurological:     Mental Status: She is alert and oriented to person, place, and time.  Psychiatric:        Behavior: Behavior normal.    Assessment/Plan  1. Controlled  type 2 diabetes mellitus with hyperglycemia, without long-term current use of insulin ([place]) Diet controlled; she is planning on starting exercise routine and eating healthier; feels this will be easier for her in retirement.  - Comprehensive metabolic panel; Future - Comprehensive metabolic panel  2. Hyperparathyroidism ([place]) She does not want surgery. She is getting second opinion at [facility] and is on cancellation list for appointment.  - Comprehensive metabolic panel; Future - Comprehensive metabolic panel  3. Dyslipidemia She is tolerating pravastatin. Continue 40mg .  - Lipid panel; Future - Comprehensive metabolic panel; Future - Comprehensive metabolic panel - Lipid panel  4. Seasonal allergies Will try combo steroid/antihistamine to see if this gives more congestion relief. She has not noted improvement with antihistamines in the past. We discussed option of seeing allergy, ENT which she has done in remote past. Prefers to try this first. - Azelastine-Fluticasone 137-50 MCG/ACT SUSP; Place 1 spray into the nose every 12 (twelve) hours.  Dispense: 23 g; Refill: 5   Return in about 6 months (around 10/06/2021) for physical exam.   44 minutes spent in chart review, time with patient. We reviewed together her health screening from work as well as cholesterol and blood glucose trends from last few years. Discussed planned evaluation today. Discussed treatment options for nasal allergy/congestion. Exam, charting time.    Micheline Rough, MD

## 2021-04-13 ENCOUNTER — Telehealth: Payer: Self-pay | Admitting: Family Medicine

## 2021-04-13 NOTE — Telephone Encounter (Signed)
Pt is calling in stating that she is needing a PA per the pharmacy for azelastine-fluticasone 137-50 MCG and would like to have a call back when it has been approved.

## 2021-04-17 ENCOUNTER — Other Ambulatory Visit (HOSPITAL_COMMUNITY): Payer: Self-pay | Admitting: Surgery

## 2021-04-17 ENCOUNTER — Telehealth: Payer: Self-pay

## 2021-04-17 ENCOUNTER — Other Ambulatory Visit: Payer: Self-pay | Admitting: Surgery

## 2021-04-17 DIAGNOSIS — E210 Primary hyperparathyroidism: Secondary | ICD-10-CM

## 2021-04-17 NOTE — Telephone Encounter (Signed)
PA approved Key: [id number]

## 2021-06-25 ENCOUNTER — Other Ambulatory Visit: Payer: Self-pay | Admitting: Family Medicine

## 2021-07-03 ENCOUNTER — Other Ambulatory Visit: Payer: Self-pay

## 2021-07-03 ENCOUNTER — Encounter: Payer: Self-pay | Admitting: Family Medicine

## 2021-07-03 ENCOUNTER — Ambulatory Visit: Payer: Managed Care, Other (non HMO) | Admitting: Family Medicine

## 2021-07-03 VITALS — BP 122/60 | HR 102 | Temp 98.3°F | Ht 61.0 in | Wt 153.3 lb

## 2021-07-03 DIAGNOSIS — I10 Essential (primary) hypertension: Secondary | ICD-10-CM

## 2021-07-03 DIAGNOSIS — E785 Hyperlipidemia, unspecified: Secondary | ICD-10-CM

## 2021-07-03 DIAGNOSIS — E559 Vitamin D deficiency, unspecified: Secondary | ICD-10-CM

## 2021-07-03 DIAGNOSIS — E1165 Type 2 diabetes mellitus with hyperglycemia: Secondary | ICD-10-CM | POA: Diagnosis not present

## 2021-07-03 DIAGNOSIS — E213 Hyperparathyroidism, unspecified: Secondary | ICD-10-CM | POA: Diagnosis not present

## 2021-07-03 DIAGNOSIS — Z23 Encounter for immunization: Secondary | ICD-10-CM | POA: Diagnosis not present

## 2021-07-03 LAB — COMPREHENSIVE METABOLIC PANEL
ALT: 11 U/L (ref 0–35)
AST: 12 U/L (ref 0–37)
Albumin: 4.3 g/dL (ref 3.5–5.2)
Alkaline Phosphatase: 54 U/L (ref 39–117)
BUN: 14 mg/dL (ref 6–23)
CO2: 26 mEq/L (ref 19–32)
Calcium: 11 mg/dL — ABNORMAL HIGH (ref 8.4–10.5)
Chloride: 103 mEq/L (ref 96–112)
Creatinine, Ser: 0.82 mg/dL (ref 0.40–1.20)
GFR: 75.71 mL/min (ref >60.00)
Glucose, Bld: 83 mg/dL (ref 70–99)
Potassium: 4.1 mEq/L (ref 3.5–5.1)
Sodium: 137 mEq/L (ref 135–145)
Total Bilirubin: 0.6 mg/dL (ref 0.2–1.2)
Total Protein: 7.8 g/dL (ref 6.0–8.3)

## 2021-07-03 LAB — POCT GLYCOSYLATED HEMOGLOBIN (HGB A1C): Hemoglobin A1C: 6.6 % — AB (ref 4.0–5.6)

## 2021-07-03 LAB — HEMOGLOBIN A1C: Hgb A1c MFr Bld: 6.9 % — ABNORMAL HIGH (ref 4.6–6.5)

## 2021-07-03 NOTE — Progress Notes (Signed)
Rebecca Paul DOB: Dec 16, 1955 Encounter date: 07/03/2021  This is a 65 y.o. female who presents with Chief Complaint  Patient presents with   Follow-up    History of present illness: Last visit was 04/06/21  Wasn't eating as well as she wanted to at last visit. Planned to retire and was looking forward to getting on track with eating and exercise. Doing every other day at gym. Working on eating at home and controlling eating.  Checking sugars regularly at home - usually getting high sugars a little higher in the morning.   Doing intermittent fasting. Not eating breakfast.   Having more joint pain - not sure if coming from her cholesterol medication or not. Hasn't stopped the medication this time.   Also having more indigestion. Not always good about giving time between eating and lying down. Feels burping. Even bought mylanta because of this. Took it before she ate and it seemed to help. No significant history of this. Doesn't get stomach pains; had one this week. Bowels usually move daily after coffee; but sometimes goes through a little more constipation. Did take some metamucil and tries to drink that every other day.   HL: pravastatin 24m; ldl 117 when checked in June.  HTN: losartan 1073m amlodipine 69m52m   Just always feels nasal-y or like she is breathing through her mouth. New nasal spray is working better for her, but she still is congested.   Hyperparathyroid: second opinion through [facility] December. She is still awaiting this visit.   Bp recheck 120/77 pulse 58 (my recheck was 120/70)    No Known Allergies Current Meds  Medication Sig   amLODipine (NORVASC) 5 MG tablet TAKE 1 TABLET BY MOUTH EVERY DAY   Azelastine-Fluticasone 137-50 MCG/ACT SUSP Place 1 spray into the nose every 12 (twelve) hours.   blood glucose meter kit and supplies KIT Dispense based on patient and insurance preference. Use up to four times daily as directed. (FOR ICD-9 250.00, 250.01).    blood glucose meter kit and supplies Use as directed once a day   Blood Glucose Monitoring Suppl (CONTOUR BLOOD GLUCOSE SYSTEM) w/Device KIT by Does not apply route.   cholecalciferol (VITAMIN D) 1000 units tablet Take 1,000 Units by mouth daily.   CONTOUR NEXT TEST test strip USE AS INSTRUCTED   famciclovir (FAMVIR) 250 MG tablet Take 1 tablet (250 mg total) by mouth 2 (two) times daily as needed.   losartan (COZAAR) 100 MG tablet TAKE 1 TABLET BY MOUTH EVERY DAY   pravastatin (PRAVACHOL) 40 MG tablet TAKE 1 TABLET BY MOUTH EVERY DAY   vitamin C (ASCORBIC ACID) 500 MG tablet Take 1,000 mg by mouth daily.    vitamin E 100 UNIT capsule Take by mouth daily.    Review of Systems  Constitutional:  Negative for chills, fatigue and fever.  Respiratory:  Negative for cough, chest tightness, shortness of breath and wheezing.   Cardiovascular:  Negative for chest pain, palpitations and leg swelling.   Objective:  BP 122/60 (BP Location: Left Arm, Patient Position: Sitting, Cuff Size: Large)   Pulse (!) 102   Temp 98.3 F (36.8 C) (Oral)   Ht 5' 1"  (1.549 m)   Wt 153 lb 4.8 oz (69.5 kg)   BMI 28.97 kg/m   Weight: 153 lb 4.8 oz (69.5 kg)   BP Readings from Last 3 Encounters:  07/03/21 122/60  04/06/21 140/80  02/09/21 120/82   Wt Readings from Last 3 Encounters:  07/03/21 153  lb 4.8 oz (69.5 kg)  04/06/21 156 lb (70.8 kg)  02/09/21 156 lb 6 oz (70.9 kg)    Physical Exam Constitutional:      General: She is not in acute distress.    Appearance: She is well-developed.  Cardiovascular:     Rate and Rhythm: Normal rate and regular rhythm.     Heart sounds: Normal heart sounds. No murmur heard.   No friction rub.  Pulmonary:     Effort: Pulmonary effort is normal. No respiratory distress.     Breath sounds: Normal breath sounds. No wheezing or rales.  Musculoskeletal:     Right lower leg: No edema.     Left lower leg: No edema.  Neurological:     Mental Status: She is alert and  oriented to person, place, and time.  Psychiatric:        Behavior: Behavior normal.    Assessment/Plan 1. Essential hypertension Blood pressure well controlled.  Continue current medication. - Comprehensive metabolic panel; Future - Comprehensive metabolic panel  2. Controlled type 2 diabetes mellitus with hyperglycemia, without long-term current use of insulin ([place]) Encouraged her to get daily activity, healthy eating.  Recheck blood work today. - POC HgB A1c - Hemoglobin A1c; Future - Hemoglobin A1c  3. Hyperparathyroidism [facility]) She is following regularly with endocrinology.  She does have an appointment at [facility] in December.  4. Dyslipidemia Continue with pravastatin 40 mg daily.  She does feel that this is causing some discomfort.  We will check NMR lipid profile to better evaluate and advise on statin/cholesterol treatment. - NMR, lipoprofile; Future - NMR, lipoprofile  5. Vitamin D deficiency She is taking 1000 units daily vitamin D.  6. Need for immunization against influenza - Flu Vaccine QUAD 6+ mos PF IM (Fluarix Quad PF)    Return for scheduled physical. 42 minutes spent in chart review, time with patient, discussion of blood pressure and blood sugar goals, exam, charting.    Micheline Rough, MD

## 2021-07-03 NOTE — Patient Instructions (Signed)
Continue to monitor home blood pressures - let me know if regularly seeing pressures less than 110/65.

## 2021-07-04 LAB — NMR, LIPOPROFILE
Cholesterol, Total: 198 mg/dL (ref 100–199)
HDL Particle Number: 28.2 umol/L — ABNORMAL LOW (ref >=30.5)
HDL-C: 55 mg/dL (ref >39)
LDL Particle Number: 1772 nmol/L — ABNORMAL HIGH (ref <1000)
LDL Size: 21 nm (ref >20.5)
LDL-C (NIH Calc): 130 mg/dL — ABNORMAL HIGH (ref 0–99)
LP-IR Score: 29 (ref <=45)
Small LDL Particle Number: 824 nmol/L — ABNORMAL HIGH (ref <=527)
Triglycerides: 74 mg/dL (ref 0–149)

## 2021-07-10 ENCOUNTER — Telehealth: Payer: Self-pay | Admitting: Family Medicine

## 2021-07-10 ENCOUNTER — Other Ambulatory Visit: Payer: Self-pay | Admitting: *Deleted

## 2021-07-10 MED ORDER — ROSUVASTATIN CALCIUM 5 MG PO TABS: 5.0000 mg | ORAL_TABLET | Freq: Every day | ORAL | 1 refills | Status: DC

## 2021-07-10 NOTE — Telephone Encounter (Signed)
PT called back to advise that she is returning the missed call about results

## 2021-07-10 NOTE — Telephone Encounter (Signed)
See results note. 

## 2021-07-15 ENCOUNTER — Telehealth (INDEPENDENT_AMBULATORY_CARE_PROVIDER_SITE_OTHER): Payer: Managed Care, Other (non HMO) | Admitting: Family Medicine

## 2021-07-15 VITALS — BP 154/79 | HR 51

## 2021-07-15 DIAGNOSIS — E1165 Type 2 diabetes mellitus with hyperglycemia: Secondary | ICD-10-CM

## 2021-07-15 DIAGNOSIS — E785 Hyperlipidemia, unspecified: Secondary | ICD-10-CM | POA: Diagnosis not present

## 2021-07-15 DIAGNOSIS — I10 Essential (primary) hypertension: Secondary | ICD-10-CM | POA: Diagnosis not present

## 2021-07-15 NOTE — Progress Notes (Signed)
Virtual Visit via Video Note  I connected with Rebecca Paul July  on 07/15/21 at  1:00 PM EDT by a video enabled telemedicine application and verified that I am speaking with the correct person using two identifiers.  Patient unable to do video visit, so visit was completed by telephone.  Location patient: home Location provider: [place], [place] [address] Persons participating in the virtual visit: patient, provider  I discussed the limitations of evaluation and management by telemedicine and the availability of in person appointments. The patient expressed understanding and agreed to proceed.   Rebecca Paul DOB: 1956-01-21 Encounter date: 07/15/2021  This is a 65 y.o. female who presents with Chief Complaint  Patient presents with   Medication Reaction    Discuss labs    History of present illness: A1C on 07/03/21 was elevated to 6.9 from prior 6.4 noted back 3 months ago. Additionally cholesterol profile showed increased CV rsk due to particle size and LDL not at goal. Suggested change from pravastatin to crestor.  She was waiting for discussion today before starting this.   She had gone on vacation in June. She is checking morning sugars and getting good fasting numbers 109 this morning. Her 14 day average was in the low 120's.   Pressure up today to 154/79; first check was even higher than that. Her recheck just now 129/74 and 135/69 other arm. Took some theraflu last night for scratchy throat so thinks that may have affected it. Last Friday was 142/73 and 170/80's. Recheck Sunday and Monday 134/69, 129/68, 134/74, 143; and then didn't take yesterday. Thinks may have missed medications on Monday. Has been feeling a little light headed/imbalance.    No Known Allergies Current Meds  Medication Sig   amLODipine (NORVASC) 5 MG tablet TAKE 1 TABLET BY MOUTH EVERY DAY   Azelastine-Fluticasone 137-50 MCG/ACT SUSP Place 1 spray into the nose  every 12 (twelve) hours.   blood glucose meter kit and supplies KIT Dispense based on patient and insurance preference. Use up to four times daily as directed. (FOR ICD-9 250.00, 250.01).   blood glucose meter kit and supplies Use as directed once a day   Blood Glucose Monitoring Suppl (CONTOUR BLOOD GLUCOSE SYSTEM) w/Device KIT by Does not apply route.   cholecalciferol (VITAMIN D) 1000 units tablet Take 1,000 Units by mouth daily.   CONTOUR NEXT TEST test strip USE AS INSTRUCTED   famciclovir (FAMVIR) 250 MG tablet Take 1 tablet (250 mg total) by mouth 2 (two) times daily as needed.   losartan (COZAAR) 100 MG tablet TAKE 1 TABLET BY MOUTH EVERY DAY   pravastatin (PRAVACHOL) 40 MG tablet TAKE 1 TABLET BY MOUTH EVERY DAY    Review of Systems  Constitutional:  Negative for chills, fatigue and fever.  Respiratory:  Negative for cough, chest tightness, shortness of breath and wheezing.   Cardiovascular:  Negative for chest pain, palpitations and leg swelling.   Objective:  BP (!) 154/79   Pulse (!) 51       BP Readings from Last 3 Encounters:  07/15/21 (!) 154/79  07/03/21 122/60  04/06/21 140/80   Wt Readings from Last 3 Encounters:  07/03/21 153 lb 4.8 oz (69.5 kg)  04/06/21 156 lb (70.8 kg)  02/09/21 156 lb 6 oz (70.9 kg)    EXAM:  GENERAL: Sounds alert, oriented, appears well and in no acute distress LUNGS: No notable cough or shortness of breath during conversation. PSYCH/NEURO:  pleasant and cooperative, no obvious depression or anxiety, speech and thought processing grossly intact   Assessment/Plan  1. Controlled type 2 diabetes mellitus with hyperglycemia, without long-term current use of insulin ([place]) She prefers to work on diet and exercise to improve blood sugar rather than adding medication.  I do think she would respond well to GLP-1 in terms of weight loss and sugar control.  If she is not getting improvement in A1c in 3 months time, we will rediscuss.  2.  Essential hypertension Blood pressure has been somewhat labile.  She will continue to monitor at home.  She thinks she may have missed a test, which may be contributing to this.  Do suspect with healthier eating and regular exercise blood pressure will improve.  3. Dyslipidemia We discussed cholesterol goals with diabetes today, specifically LDL goals.  She has been above goal even on her pravastatin.  We discussed higher potency statin (Crestor) and she is willing to try this.  We reviewed her NMR LipoProfile. Discussed new medication(s) today with patient. Discussed potential side effects and patient verbalized understanding.    I discussed the assessment and treatment plan with the patient. The patient was provided an opportunity to ask questions and all were answered. The patient agreed with the plan and demonstrated an understanding of the instructions.   The patient was advised to call back or seek an in-person evaluation if the symptoms worsen or if the condition fails to improve as anticipated.  I provided 30 minutes of non-face-to-face time during this encounter.   Micheline Rough, MD

## 2021-07-16 ENCOUNTER — Telehealth: Payer: Self-pay | Admitting: *Deleted

## 2021-07-16 NOTE — Telephone Encounter (Signed)
Spoke with the patient and re/scheduled appts as below.

## 2021-07-16 NOTE — Telephone Encounter (Signed)
-----   Message from Caren Macadam, MD sent at 07/15/2021  1:42 PM EDT ----- Please set up lab visit prior to and reschedule physical for 3 months from today

## 2021-07-23 ENCOUNTER — Other Ambulatory Visit: Payer: Self-pay

## 2021-07-23 ENCOUNTER — Encounter (HOSPITAL_COMMUNITY)
Admission: RE | Admit: 2021-07-23 | Discharge: 2021-07-23 | Disposition: A | Discharge status: Home / Self Care | Payer: Managed Care, Other (non HMO) | Source: Ambulatory Visit | Attending: Surgery | Admitting: Surgery

## 2021-07-23 ENCOUNTER — Ambulatory Visit (HOSPITAL_COMMUNITY)
Admission: RE | Admit: 2021-07-23 | Discharge: 2021-07-23 | Disposition: A | Discharge status: Home / Self Care | Payer: Managed Care, Other (non HMO) | Source: Ambulatory Visit | Attending: Surgery | Admitting: Surgery

## 2021-07-23 DIAGNOSIS — E210 Primary hyperparathyroidism: Secondary | ICD-10-CM | POA: Insufficient documentation

## 2021-07-23 MED ORDER — TECHNETIUM TC 99M SESTAMIBI - CARDIOLITE
25.2000 | Freq: Once | INTRAVENOUS | Status: AC
  Administered 2021-07-23: 25.2 via INTRAVENOUS

## 2021-07-28 DIAGNOSIS — E213 Hyperparathyroidism, unspecified: Secondary | ICD-10-CM

## 2021-07-28 HISTORY — DX: Hyperparathyroidism, unspecified: E21.3

## 2021-07-28 NOTE — Progress Notes (Signed)
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  This is consistent with the patient's lab studies.  USN does not show any concurrent thyroid disease.  I would recommend proceeding with out-patient minimally invasive right parathyroidectomy.  Will send results to patient and wait to see how she would like to proceed.  She was also considering being seen at [facility] for a second opinion.  [place], MD [facility] [place] practice Office: [phone]

## 2021-07-31 ENCOUNTER — Other Ambulatory Visit: Payer: Self-pay | Admitting: Family Medicine

## 2021-07-31 ENCOUNTER — Other Ambulatory Visit: Payer: Self-pay | Admitting: Obstetrics & Gynecology

## 2021-07-31 DIAGNOSIS — N644 Mastodynia: Secondary | ICD-10-CM

## 2021-07-31 DIAGNOSIS — E1165 Type 2 diabetes mellitus with hyperglycemia: Secondary | ICD-10-CM

## 2021-08-03 ENCOUNTER — Other Ambulatory Visit: Payer: Self-pay | Admitting: Obstetrics and Gynecology

## 2021-08-03 DIAGNOSIS — N644 Mastodynia: Secondary | ICD-10-CM

## 2021-08-10 ENCOUNTER — Other Ambulatory Visit: Payer: Self-pay

## 2021-08-10 ENCOUNTER — Ambulatory Visit
Admission: RE | Admit: 2021-08-10 | Discharge: 2021-08-10 | Disposition: A | Discharge status: Home / Self Care | Payer: Managed Care, Other (non HMO) | Source: Ambulatory Visit | Attending: Obstetrics and Gynecology | Admitting: Obstetrics and Gynecology

## 2021-08-10 ENCOUNTER — Ambulatory Visit: Payer: Managed Care, Other (non HMO)

## 2021-08-10 DIAGNOSIS — N644 Mastodynia: Secondary | ICD-10-CM

## 2021-08-17 ENCOUNTER — Ambulatory Visit: Payer: Managed Care, Other (non HMO) | Admitting: Internal Medicine

## 2021-08-17 ENCOUNTER — Telehealth: Payer: Self-pay

## 2021-08-17 NOTE — Telephone Encounter (Signed)
Spoke with the patient and questioned if she was referring to the last EKG performed here which was in November 2021.  Patient stated she is certain that she had an EKG done when she was here in September and I informed her I do not see this in the chart.  Patient is aware the message will be forwarded to PCP for review on 11/7.

## 2021-08-17 NOTE — Telephone Encounter (Signed)
Patient called asking for a call back to discuss the results of her last EKG patient stated it was not indicated on AVS or Mychart and she has an appt to see Cardiologist soon.

## 2021-08-24 ENCOUNTER — Encounter: Payer: Self-pay | Admitting: Cardiology

## 2021-08-24 NOTE — Telephone Encounter (Addendum)
Pt would like to know the results of EKG she had in nov 2021 and is  the information  in her mychart and how can she get a copy of EKG. Pt also would like to know if we have access to other provider records

## 2021-08-24 NOTE — Telephone Encounter (Signed)
Left a detailed message with the information below at the patient's cell number. ?

## 2021-08-24 NOTE — Telephone Encounter (Signed)
Last one I see was from 08/2020 visit. We discussed chest pain at that time and she stated she hadn't had an ekg in awhile; and one was performed. I don't see one since then.

## 2021-08-25 ENCOUNTER — Ambulatory Visit: Payer: Managed Care, Other (non HMO) | Admitting: Cardiology

## 2021-08-25 ENCOUNTER — Telehealth: Payer: Self-pay | Admitting: Family Medicine

## 2021-08-25 ENCOUNTER — Other Ambulatory Visit: Payer: Self-pay

## 2021-08-25 ENCOUNTER — Encounter: Payer: Self-pay | Admitting: Cardiology

## 2021-08-25 VITALS — BP 120/70 | HR 63 | Ht 61.0 in | Wt 153.4 lb

## 2021-08-25 DIAGNOSIS — Z8249 Family history of ischemic heart disease and other diseases of the circulatory system: Secondary | ICD-10-CM

## 2021-08-25 DIAGNOSIS — I10 Essential (primary) hypertension: Secondary | ICD-10-CM

## 2021-08-25 DIAGNOSIS — Z79899 Other long term (current) drug therapy: Secondary | ICD-10-CM | POA: Diagnosis not present

## 2021-08-25 DIAGNOSIS — R0789 Other chest pain: Secondary | ICD-10-CM

## 2021-08-25 DIAGNOSIS — E785 Hyperlipidemia, unspecified: Secondary | ICD-10-CM | POA: Diagnosis not present

## 2021-08-25 DIAGNOSIS — E1165 Type 2 diabetes mellitus with hyperglycemia: Secondary | ICD-10-CM | POA: Diagnosis not present

## 2021-08-25 MED ORDER — METOPROLOL TARTRATE 50 MG PO TABS: ORAL_TABLET | ORAL | 0 refills | Status: DC

## 2021-08-25 NOTE — Telephone Encounter (Signed)
Patient is requesting to be prescribed for diabetes. She stated that her cardiologist advised her to speak with [facility].  Patient could be contacted at [phone].  Please advise.

## 2021-08-25 NOTE — Telephone Encounter (Signed)
She is at cardiology visit today. We can see their notes, they can see ours and can see prior ekg. Her last ekg was normal. Read as bradycardia which means that heart rate was a little lower than normal but otherwise no strain/stress abnormalities noted. I'm sure that the cardiologist will review this today with her. I thought since in her chart she could view in mychart? But if she can't we can print copy of this for her to have.

## 2021-08-25 NOTE — Patient Instructions (Addendum)
Medication Instructions:  Your physician recommends that you continue on your current medications as directed. Please refer to the Current Medication list given to you today.  *If you need a refill on your cardiac medications before your next appointment, please call your pharmacy*   Lab Work: Your physician recommends that you return for lab work in: 3-7 days before CT scan:  BMET, Mag If you have labs (blood work) drawn today and your tests are completely normal, you will receive your results only by: [place] (if you have MyChart) OR A paper copy in the mail If you have any lab test that is abnormal or we need to change your treatment, we will call you to review the results.   Testing/Procedures:   Your cardiac CT will be scheduled at one of the below locations:   [facility] [address] [place], [place] [address] [phone]   If scheduled at [facility], please arrive at the [facility] main entrance (entrance A) of [facility] 30 minutes prior to test start time. You can use the FREE valet parking offered at the main entrance (encouraged to control the heart rate for the test) Proceed to the [facility] Radiology Department (first floor) to check-in and test prep.   Please follow these instructions carefully (unless otherwise directed):   On the Night Before the Test: Be sure to Drink plenty of water. Do not consume any caffeinated/decaffeinated beverages or chocolate 12 hours prior to your test. Do not take any antihistamines 12 hours prior to your test.    On the Day of the Test: Drink plenty of water until 1 hour prior to the test. Do not eat any food 4 hours prior to the test. You may take your regular medications prior to the test.  Take metoprolol (Lopressor) two hours prior to test. FEMALES- please wear underwire-free bra if available, avoid dresses & tight clothing       After the Test: Drink plenty of  water. After receiving IV contrast, you may experience a mild flushed feeling. This is normal. On occasion, you may experience a mild rash up to 24 hours after the test. This is not dangerous. If this occurs, you can take Benadryl 25 mg and increase your fluid intake. If you experience trouble breathing, this can be serious. If it is severe call 911 IMMEDIATELY. If it is mild, please call our office. If you take any of these medications: Glipizide/Metformin, Avandament, Glucavance, please do not take 48 hours after completing test unless otherwise instructed.  Please allow 2-4 weeks for scheduling of routine cardiac CTs. Some insurance companies require a pre-authorization which may delay scheduling of this test.   For non-scheduling related questions, please contact the cardiac imaging nurse navigator should you have any questions/concerns: Marchia Bond, Cardiac Imaging Nurse Navigator Gordy Clement, Cardiac Imaging Nurse Navigator [facility] Direct Office Dial: [phone]   For scheduling needs, including cancellations and rescheduling, please call [place], [phone].    Follow-Up: At [facility], you and your health needs are our priority.  As part of our continuing mission to provide you with exceptional heart care, we have created designated Provider Care Teams.  These Care Teams include your primary Cardiologist (physician) and Advanced Practice Providers (APPs -  Physician Assistants and Nurse Practitioners) who all work together to provide you with the care you need, when you need it.  We recommend signing up for the patient portal called "MyChart".  Sign up information is provided on this After Visit Summary.  MyChart is used to connect with patients for Virtual Visits (Telemedicine).  Patients are able to view lab/test results, encounter notes, upcoming appointments, etc.  Non-urgent messages can be sent to your provider as well.   To learn more  about what you can do with MyChart, go to [email or web address].    Your next appointment:   3 month(s)  The format for your next appointment:   In Person  Provider:   Berniece Salines, DO   Other Instructions

## 2021-08-25 NOTE — Progress Notes (Signed)
Cardiology Office Note:    Date:  08/27/2021   ID:  Rebecca Paul, DOB [birth year], MRN [id number]  PCP:  Caren Macadam, MD  Cardiologist:  None  Electrophysiologist:  None   Referring MD: Linda Hedges, DO   " I am experiencing chest discomfort"  History of Present Illness:    Rebecca Paul is a 65 y.o. female with a hx of hypertension, diabetes mellitus, hyperlipidemia is here today to be evaluated for chest discomfort.  The patient tells me that she has been experiencing intermittent chest discomfort.  She describes this as a midsternal sometimes dull sensation.  She notes that when it first started it was midsternal did not radiate.  Most recently she has had this pain feels more under her baseline.  She denies any associated shortness of breath.  Past Medical History:  Diagnosis Date   Allergy    more sinus issues    Anemia    yrs past as child , with pregnancy    Arthritis    shoulders - not diagnosed    Headache(784.0)    Hyperlipidemia    Hyperparathyroidism ([place]) 07/28/2021   Hypertension    IGT (impaired glucose tolerance)    Kidney stone    Osteopenia    Plantar fasciitis     Past Surgical History:  Procedure Laterality Date   ABDOMINAL HYSTERECTOMY     fibroids   CESAREAN SECTION     COLONOSCOPY  01/2018   normal   OOPHORECTOMY     dermoid tumor   TONSILLECTOMY AND ADENOIDECTOMY      Current Medications: Current Meds  Medication Sig   amLODipine (NORVASC) 5 MG tablet TAKE 1 TABLET BY MOUTH EVERY DAY   Azelastine-Fluticasone 137-50 MCG/ACT SUSP Place 1 spray into the nose every 12 (twelve) hours.   blood glucose meter kit and supplies KIT Dispense based on patient and insurance preference. Use up to four times daily as directed. (FOR ICD-9 250.00, 250.01).   blood glucose meter kit and supplies Use as directed once a day   Blood Glucose Monitoring Suppl (CONTOUR BLOOD GLUCOSE SYSTEM) w/Device KIT by Does not apply route.   Cholecalciferol  (VITAMIN D) 50 MCG (2000 UT) tablet 1 tablet   Continuous Blood Gluc Sensor (FREESTYLE LIBRE 2 SENSOR) MISC 1 DEVICE BY DOES NOT APPLY ROUTE EVERY 14 (FOURTEEN) DAYS.   CONTOUR NEXT TEST test strip USE AS INSTRUCTED   famciclovir (FAMVIR) 250 MG tablet Take 1 tablet (250 mg total) by mouth 2 (two) times daily as needed.   losartan (COZAAR) 100 MG tablet TAKE 1 TABLET BY MOUTH EVERY DAY   metoprolol tartrate (LOPRESSOR) 50 MG tablet Take 2 hours before CT scan   pravastatin (PRAVACHOL) 40 MG tablet TAKE 1 TABLET BY MOUTH EVERY DAY   vitamin C (ASCORBIC ACID) 500 MG tablet Take 1,000 mg by mouth daily.    vitamin E 100 UNIT capsule Take by mouth daily.     Allergies:   Patient has no known allergies.   Social History   Socioeconomic History   Marital status: Widowed    Spouse name: Not on file   Number of children: Not on file   Years of education: Not on file   Highest education level: Not on file  Occupational History   Not on file  Tobacco Use   Smoking status: Never   Smokeless tobacco: Never  Vaping Use   Vaping Use: Never used  Substance and Sexual Activity   Alcohol use:  Yes    Comment: rare glass of wine    Drug use: No   Sexual activity: Not Currently  Other Topics Concern   Not on file  Social History Narrative   Not on file   Social Determinants of Health   Financial Resource Strain: Not on file  Food Insecurity: Not on file  Transportation Needs: Not on file  Physical Activity: Not on file  Stress: Not on file  Social Connections: Not on file     Family History: The patient's family history includes Cancer in her father and another family member; Diabetes in her maternal grandmother, mother, sister, and another family member; Heart attack in her sister; Hyperlipidemia in her mother and another family member; Hypertension in her mother and another family member; Kidney disease in an other family member; Obesity in her sister and sister; Thyroid disease in her  mother. There is no history of Colon cancer, Colon polyps, Rectal cancer, or Stomach cancer.  ROS:   Review of Systems  Constitution: Negative for decreased appetite, fever and weight gain.  HENT: Negative for congestion, ear discharge, hoarse voice and sore throat.   Eyes: Negative for discharge, redness, vision loss in right eye and visual halos.  Cardiovascular: Negative for chest pain, dyspnea on exertion, leg swelling, orthopnea and palpitations.  Respiratory: Negative for cough, hemoptysis, shortness of breath and snoring.   Endocrine: Negative for heat intolerance and polyphagia.  Hematologic/Lymphatic: Negative for bleeding problem. Does not bruise/bleed easily.  Skin: Negative for flushing, nail changes, rash and suspicious lesions.  Musculoskeletal: Negative for arthritis, joint pain, muscle cramps, myalgias, neck pain and stiffness.  Gastrointestinal: Negative for abdominal pain, bowel incontinence, diarrhea and excessive appetite.  Genitourinary: Negative for decreased libido, genital sores and incomplete emptying.  Neurological: Negative for brief paralysis, focal weakness, headaches and loss of balance.  Psychiatric/Behavioral: Negative for altered mental status, depression and suicidal ideas.  Allergic/Immunologic: Negative for HIV exposure and persistent infections.    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  Recent Labs: 12/08/2020: Hemoglobin 13.3; Platelets 271.0 07/03/2021: ALT 11; BUN 14; Creatinine, Ser 0.82; Potassium 4.1; Sodium 137  Recent Lipid Panel    Component Value Date/Time   CHOL 192 04/06/2021 1401   TRIG 107.0 04/06/2021 1401   TRIG 95 09/26/2006 1002   HDL 54.30 04/06/2021 1401   CHOLHDL 4 04/06/2021 1401   VLDL 21.4 04/06/2021 1401   LDLCALC 117 (H) 04/06/2021 1401    LDLCALC 116 (H) 08/25/2020 1158   LDLDIRECT 140.3 12/04/2012 1039    Physical Exam:    VS:  BP 120/70 (BP Location: Right Arm)   Pulse 63   Ht 5' 1"  (1.549 m)   Wt 153 lb 6.4 oz (69.6 kg)   SpO2 96%   BMI 28.98 kg/m     Wt Readings from Last 3 Encounters:  08/25/21 153 lb 6.4 oz (69.6 kg)  07/03/21 153 lb 4.8 oz (69.5 kg)  04/06/21 156 lb (70.8 kg)     GEN: Well nourished, well developed in no acute distress HEENT: Normal NECK: No JVD; No carotid bruits LYMPHATICS: No lymphadenopathy CARDIAC: S1S2 noted,RRR, no murmurs, rubs, gallops RESPIRATORY:  Clear to auscultation without rales, wheezing or rhonchi  ABDOMEN: Soft, non-tender, non-distended, +bowel sounds, no guarding. EXTREMITIES: No edema, No cyanosis, no clubbing MUSCULOSKELETAL:  No deformity  SKIN:  Warm and dry NEUROLOGIC:  Alert and oriented x 3, non-focal PSYCHIATRIC:  Normal affect, good insight  ASSESSMENT:    1. Other chest pain   2. Hyperlipidemia, unspecified hyperlipidemia type   3. Controlled type 2 diabetes mellitus with hyperglycemia, without long-term current use of insulin ([place])   4. Medication management   5. Essential hypertension   6. Family history of premature CAD    PLAN:     The symptoms chest pain is concerning, this patient does have intermediate to high risk for coronary artery disease which includes diabetes mellitus, premature family history of CAD, hypertension hyperlipidemia and at this time I would like to pursue an ischemic evaluation in this patient.  Shared decision a coronary CTA at this time is appropriate.  I have discussed with the patient about the testing.  The patient has no IV contrast allergy and is agreeable to proceed with this test.  Her hyperlipidemia is not treated her most recent lipid profile her most recent lipid profile showed HDL 55, LDL 130, total cholesterol 188, triglycerides 74.  The patient tells me that her PCP had recommended switching to rosuvastatin but  she had been hesitant.  Explained to the patient that there would be a good change moving from pravastatin to rosuvastatin to help with her LDL numbers.  She is agreeable to make this change now.  Hypertension- Blood pressure is acceptable, continue with current antihypertensive regimen.  Diabetes mellitus-this is being managed by his primary care doctor.  No adjustments for antidiabetic medications were made today.  The patient is in agreement with the above plan. The patient left the office in stable condition.  The patient will follow up in 3 months or sooner if needed.   Medication Adjustments/Labs and Tests Ordered: Current medicines are reviewed at length with the patient today.  Concerns regarding medicines are outlined above.  Orders Placed This Encounter  Procedures   CT CORONARY MORPH W/CTA COR W/SCORE W/CA W/CM &/OR WO/CM   Basic Metabolic Panel (BMET)   Magnesium   EKG 12-Lead   Meds ordered this encounter  Medications   metoprolol tartrate (LOPRESSOR) 50 MG tablet    Sig: Take 2 hours before CT scan    Dispense:  1 tablet    Refill:  0    Patient Instructions  Medication Instructions:  Your physician recommends that you continue on your current medications as directed. Please refer to the Current Medication list given to you today.  *If you need a refill on your cardiac medications before your next appointment, please call your pharmacy*   Lab Work: Your physician recommends that you return for lab work in: 3-7 days before CT scan:  BMET, Mag If you have labs (blood work) drawn today and your tests are completely normal, you will receive your results only by: [place] (if you have MyChart) OR A paper copy in the mail If you have any lab test that is abnormal or we need to change your treatment, we will call you to review the results.   Testing/Procedures:   Your cardiac CT will be scheduled at one of the below locations:   [facility] [address] [place], [place] [address] [phone]   If scheduled at [facility], please arrive at the [facility] main entrance (entrance A) of [facility] 30 minutes prior to test start time. You can use the FREE valet parking offered at the main entrance (encouraged to control the heart rate  for the test) Proceed to the [facility] Radiology Department (first floor) to check-in and test prep.   Please follow these instructions carefully (unless otherwise directed):   On the Night Before the Test: Be sure to Drink plenty of water. Do not consume any caffeinated/decaffeinated beverages or chocolate 12 hours prior to your test. Do not take any antihistamines 12 hours prior to your test.    On the Day of the Test: Drink plenty of water until 1 hour prior to the test. Do not eat any food 4 hours prior to the test. You may take your regular medications prior to the test.  Take metoprolol (Lopressor) two hours prior to test. FEMALES- please wear underwire-free bra if available, avoid dresses & tight clothing       After the Test: Drink plenty of water. After receiving IV contrast, you may experience a mild flushed feeling. This is normal. On occasion, you may experience a mild rash up to 24 hours after the test. This is not dangerous. If this occurs, you can take Benadryl 25 mg and increase your fluid intake. If you experience trouble breathing, this can be serious. If it is severe call 911 IMMEDIATELY. If it is mild, please call our office. If you take any of these medications: Glipizide/Metformin, Avandament, Glucavance, please do not take 48 hours after completing test unless otherwise instructed.  Please allow 2-4 weeks for scheduling of routine cardiac CTs. Some insurance companies require a pre-authorization which may delay scheduling of this test.   For non-scheduling related questions, please contact the cardiac imaging nurse navigator should you have any  questions/concerns: Marchia Bond, Cardiac Imaging Nurse Navigator Gordy Clement, Cardiac Imaging Nurse Navigator  Heart and Vascular Services Direct Office Dial: [phone]   For scheduling needs, including cancellations and rescheduling, please call [place], [phone].    Follow-Up: At [facility], you and your health needs are our priority.  As part of our continuing mission to provide you with exceptional heart care, we have created designated Provider Care Teams.  These Care Teams include your primary Cardiologist (physician) and Advanced Practice Providers (APPs -  Physician Assistants and Nurse Practitioners) who all work together to provide you with the care you need, when you need it.  We recommend signing up for the patient portal called "MyChart".  Sign up information is provided on this After Visit Summary.  MyChart is used to connect with patients for Virtual Visits (Telemedicine).  Patients are able to view lab/test results, encounter notes, upcoming appointments, etc.  Non-urgent messages can be sent to your provider as well.   To learn more about what you can do with MyChart, go to [email or web address].    Your next appointment:   3 month(s)  The format for your next appointment:   In Person  Provider:   Berniece Salines, DO   Other Instructions     Adopting a Healthy Lifestyle.  Know what a healthy weight is for you (roughly BMI <25) and aim to maintain this   Aim for 7+ servings of fruits and vegetables daily   65-80+ fluid ounces of water or unsweet tea for healthy kidneys   Limit to max 1 drink of alcohol per day; avoid smoking/tobacco   Limit animal fats in diet for cholesterol and heart health - choose grass fed whenever available   Avoid highly processed foods, and foods high in saturated/trans fats   Aim for low stress - take time to unwind and care for your mental health  Aim for 150 min of moderate intensity exercise weekly for  heart health, and weights twice weekly for bone health   Aim for 7-9 hours of sleep daily   When it comes to diets, agreement about the perfect plan isnt easy to find, even among the experts. Experts at the [employer] developed an idea known as the Healthy Eating Plate. Just imagine a plate divided into logical, healthy portions.   The emphasis is on diet quality:   Load up on vegetables and fruits - one-half of your plate: Aim for color and variety, and remember that potatoes dont count.   Go for whole grains - one-quarter of your plate: Whole wheat, barley, wheat berries, quinoa, oats, brown rice, and foods made with them. If you want pasta, go with whole wheat pasta.   Protein power - one-quarter of your plate: Fish, chicken, beans, and nuts are all healthy, versatile protein sources. Limit red meat.   The diet, however, does go beyond the plate, offering a few other suggestions.   Use healthy plant oils, such as olive, canola, soy, corn, sunflower and peanut. Check the labels, and avoid partially hydrogenated oil, which have unhealthy trans fats.   If youre thirsty, drink water. Coffee and tea are good in moderation, but skip sugary drinks and limit milk and dairy products to one or two daily servings.   The type of carbohydrate in the diet is more important than the amount. Some sources of carbohydrates, such as vegetables, fruits, whole grains, and beans-are healthier than others.   Finally, stay active  Signed, Berniece Salines, DO  08/27/2021 10:25 PM    [place]

## 2021-08-26 NOTE — Telephone Encounter (Signed)
Patient informed of the message below.  Patient stated she discussed results with the cardiologist at her appt yesterday.

## 2021-08-26 NOTE — Telephone Encounter (Signed)
Please see if Melonie can elaborate on that message. Prescribed what?? Note from cardiology is not yet complete so I cannot see what they discussed if anything specific. There are multiple diabetic medications that benefit both sugar control, cardiac health so if she is willing to try something we can have visit to discuss. Can't tell from this message if they went into detail on something specific.

## 2021-08-26 NOTE — Telephone Encounter (Signed)
Spoke with the patient and informed her of the message below.  Patient stated she asked the cardiologist if she would be a good candidate for diabetes medication  in general with her A1C reading and was advised to contact her PCP.  Appt scheduled for 11/11 and message sent to PCP.

## 2021-08-27 ENCOUNTER — Other Ambulatory Visit: Payer: Self-pay | Admitting: Urology

## 2021-08-27 ENCOUNTER — Other Ambulatory Visit (HOSPITAL_COMMUNITY): Payer: Self-pay | Admitting: Urology

## 2021-08-27 DIAGNOSIS — D49512 Neoplasm of unspecified behavior of left kidney: Secondary | ICD-10-CM

## 2021-08-28 ENCOUNTER — Encounter: Payer: Managed Care, Other (non HMO) | Admitting: Family Medicine

## 2021-08-28 ENCOUNTER — Encounter: Payer: Self-pay | Admitting: Family Medicine

## 2021-08-28 ENCOUNTER — Other Ambulatory Visit: Payer: Self-pay

## 2021-08-28 ENCOUNTER — Ambulatory Visit (INDEPENDENT_AMBULATORY_CARE_PROVIDER_SITE_OTHER): Payer: Managed Care, Other (non HMO) | Admitting: Family Medicine

## 2021-08-28 VITALS — BP 118/77 | HR 58 | Wt 153.0 lb

## 2021-08-28 DIAGNOSIS — E1165 Type 2 diabetes mellitus with hyperglycemia: Secondary | ICD-10-CM | POA: Diagnosis not present

## 2021-08-28 MED ORDER — METFORMIN HCL 500 MG PO TABS: 500.0000 mg | ORAL_TABLET | Freq: Two times a day (BID) | ORAL | 1 refills | Status: DC

## 2021-08-28 NOTE — Progress Notes (Signed)
Virtual Visit via Telephone Note  I connected with Rebecca Paul  on 08/28/21 at  9:30 AM EST by telephone and verified that I am speaking with the correct person using two identifiers.   I discussed the limitations, risks, security and privacy concerns of performing an evaluation and management service by telephone and the availability of in person appointments. I also discussed with the patient that there may be a patient responsible charge related to this service. The patient expressed understanding and agreed to proceed.  Location patient: home Location provider:  [facility]  [place], [place] [address]  Participants present for the call: patient, provider Patient did not have a visit in the prior 7 days to address this/these issue(s).   History of Present Illness: Rebecca Paul has history of diabetes/borderline diabetes dating back to 2016. She has elected in past to keep controlled with diet/exercise.   Recently she had visit with cardiology on 11/8 for follow up on anterior chest discomfort. Per cardiology notes she agreed to trial of crestor and per patient they discussed consideration for diabetic medication that may also benefit heart.   She is ready to start on diabetic medication at this point. Sugar average has been 132 baseline, but she still wants to do what she can to prevent problems down the road. Lower when fasting; higher when she has occasional indulgence. Is losing weight. Her scale this morning 152.   Went for regular mammogram and she had pain in left breast a couple of weeks ago. When she went for imaging and told them she was examined first. Didn't feel anything. Discussed follow up. Decided to get mammogram and they also were aware of chest pains and suggested cardiology for eval.   She doesn't want any injectable medications. She doesn't want to lose weight. Just wants to make sure that sugar is well controlled with her lifestyle changes  as well.    Observations/Objective: Patient sounds cheerful and well on the phone. I do not appreciate any SOB. Speech and thought processing are grossly intact. Patient reported vitals: entered above  Assessment and Plan:  1. Controlled type 2 diabetes mellitus with hyperglycemia, without long-term current use of insulin ([place]) Discussed options including glp-1 and sglt2 and she prefers to start with metformin. Discussed holding with imaging requiring contrast. Continue to monitor blood sugars.    Follow Up Instructions: Has regular visit already scheduled.   [id number] 5-10 [id number] 11-20 9443 21-30 I did not refer this patient for an OV in the next 24 hours for this/these issue(s).  I discussed the assessment and treatment plan with the patient. The patient was provided an opportunity to ask questions and all were answered. The patient agreed with the plan and demonstrated an understanding of the instructions.   The patient was advised to call back or seek an in-person evaluation if the symptoms worsen or if the condition fails to improve as anticipated.  I provided 40 minutes of non-face-to-face time during this encounter. We discussed different diabetic medications   Micheline Rough, MD

## 2021-09-03 ENCOUNTER — Other Ambulatory Visit (HOSPITAL_COMMUNITY): Payer: Managed Care, Other (non HMO)

## 2021-09-04 ENCOUNTER — Other Ambulatory Visit: Payer: Self-pay | Admitting: Urology

## 2021-09-04 DIAGNOSIS — D4102 Neoplasm of uncertain behavior of left kidney: Secondary | ICD-10-CM

## 2021-09-25 ENCOUNTER — Telehealth (HOSPITAL_COMMUNITY): Payer: Self-pay | Admitting: *Deleted

## 2021-09-25 NOTE — Telephone Encounter (Signed)
Attempted to call patient regarding upcoming cardiac CT appointment and to remind her about the need for blood work prior to appointment. Left message on voicemail with name and callback number  Gordy Clement RN Navigator Cardiac [place] [facility] [phone] Office [phone] Cell

## 2021-09-28 ENCOUNTER — Telehealth (HOSPITAL_COMMUNITY): Payer: Self-pay | Admitting: *Deleted

## 2021-09-28 NOTE — Telephone Encounter (Signed)
Reaching out to patient to offer assistance regarding upcoming cardiac imaging study; pt verbalizes understanding of appt date/time, parking situation and where to check in, pre-test NPO status and medications ordered, and verified current allergies; name and call back number provided for further questions should they arise  Gordy Clement RN Navigator Cardiac Imaging [facility] [phone] office [phone] cell  Patient aware to take 50mg  metoprolol tartrate two hours prior to cardiac CT scan if HR greater than 65bpm. She is aware to arrive at 2pm for her 2:30pm.

## 2021-09-29 ENCOUNTER — Ambulatory Visit (HOSPITAL_COMMUNITY)
Admission: RE | Admit: 2021-09-29 | Discharge: 2021-09-29 | Disposition: A | Discharge status: Home / Self Care | Payer: Managed Care, Other (non HMO) | Source: Ambulatory Visit | Attending: Cardiology | Admitting: Cardiology

## 2021-09-29 ENCOUNTER — Other Ambulatory Visit: Payer: Self-pay

## 2021-09-29 DIAGNOSIS — R0789 Other chest pain: Secondary | ICD-10-CM | POA: Insufficient documentation

## 2021-09-29 DIAGNOSIS — I2510 Atherosclerotic heart disease of native coronary artery without angina pectoris: Secondary | ICD-10-CM | POA: Diagnosis not present

## 2021-09-29 LAB — BASIC METABOLIC PANEL
BUN/Creatinine Ratio: 12 (ref 12–28)
BUN: 9 mg/dL (ref 8–27)
CO2: 20 mmol/L (ref 20–29)
Calcium: 11.3 mg/dL — ABNORMAL HIGH (ref 8.7–10.3)
Chloride: 100 mmol/L (ref 96–106)
Creatinine, Ser: 0.77 mg/dL (ref 0.57–1.00)
Glucose: 90 mg/dL (ref 70–99)
Potassium: 4.3 mmol/L (ref 3.5–5.2)
Sodium: 137 mmol/L (ref 134–144)
eGFR: 86 mL/min/{1.73_m2} (ref >59)

## 2021-09-29 LAB — MAGNESIUM: Magnesium: 2 mg/dL (ref 1.6–2.3)

## 2021-09-29 MED ORDER — NITROGLYCERIN 0.4 MG SL SUBL
SUBLINGUAL_TABLET | SUBLINGUAL | Status: AC
  Filled 2021-09-29: qty 2

## 2021-09-29 MED ORDER — IOHEXOL 350 MG/ML SOLN
95.0000 mL | Freq: Once | INTRAVENOUS | Status: AC | PRN
  Administered 2021-09-29: 95 mL via INTRAVENOUS

## 2021-09-29 MED ORDER — NITROGLYCERIN 0.4 MG SL SUBL
0.8000 mg | SUBLINGUAL_TABLET | Freq: Once | SUBLINGUAL | Status: AC
  Administered 2021-09-29: 0.8 mg via SUBLINGUAL

## 2021-09-29 MED ORDER — NITROGLYCERIN 0.4 MG SL SUBL
SUBLINGUAL_TABLET | SUBLINGUAL | Status: AC
  Filled 2021-09-29: qty 1

## 2021-10-01 ENCOUNTER — Telehealth: Payer: Self-pay | Admitting: Cardiology

## 2021-10-01 ENCOUNTER — Other Ambulatory Visit: Payer: Managed Care, Other (non HMO)

## 2021-10-01 NOTE — Telephone Encounter (Signed)
Patient was calling back for results. Please advise

## 2021-10-02 ENCOUNTER — Other Ambulatory Visit: Payer: Self-pay

## 2021-10-02 MED ORDER — ROSUVASTATIN CALCIUM 10 MG PO TABS: 10.0000 mg | ORAL_TABLET | Freq: Every day | ORAL | 3 refills | Status: DC

## 2021-10-02 NOTE — Telephone Encounter (Signed)
Spoke with patient, see chart.    

## 2021-10-02 NOTE — Progress Notes (Signed)
Prescription sent to pharmacy.

## 2021-10-05 ENCOUNTER — Other Ambulatory Visit (INDEPENDENT_AMBULATORY_CARE_PROVIDER_SITE_OTHER): Payer: Managed Care, Other (non HMO)

## 2021-10-05 DIAGNOSIS — E1165 Type 2 diabetes mellitus with hyperglycemia: Secondary | ICD-10-CM

## 2021-10-05 DIAGNOSIS — I10 Essential (primary) hypertension: Secondary | ICD-10-CM

## 2021-10-05 DIAGNOSIS — E785 Hyperlipidemia, unspecified: Secondary | ICD-10-CM | POA: Diagnosis not present

## 2021-10-05 LAB — HEMOGLOBIN A1C: Hgb A1c MFr Bld: 6.3 % (ref 4.6–6.5)

## 2021-10-05 LAB — LIPID PANEL
Cholesterol: 171 mg/dL (ref 0–200)
HDL: 54.8 mg/dL (ref >39.00)
LDL Cholesterol: 97 mg/dL (ref 0–99)
NonHDL: 116.66
Total CHOL/HDL Ratio: 3
Triglycerides: 100 mg/dL (ref 0.0–149.0)
VLDL: 20 mg/dL (ref 0.0–40.0)

## 2021-10-05 LAB — COMPREHENSIVE METABOLIC PANEL
ALT: 16 U/L (ref 0–35)
AST: 14 U/L (ref 0–37)
Albumin: 4.4 g/dL (ref 3.5–5.2)
Alkaline Phosphatase: 49 U/L (ref 39–117)
BUN: 15 mg/dL (ref 6–23)
CO2: 26 mEq/L (ref 19–32)
Calcium: 11.1 mg/dL — ABNORMAL HIGH (ref 8.4–10.5)
Chloride: 103 mEq/L (ref 96–112)
Creatinine, Ser: 0.79 mg/dL (ref 0.40–1.20)
GFR: 79.03 mL/min (ref >60.00)
Glucose, Bld: 97 mg/dL (ref 70–99)
Potassium: 3.9 mEq/L (ref 3.5–5.1)
Sodium: 136 mEq/L (ref 135–145)
Total Bilirubin: 0.5 mg/dL (ref 0.2–1.2)
Total Protein: 7.9 g/dL (ref 6.0–8.3)

## 2021-10-08 ENCOUNTER — Telehealth: Payer: Self-pay

## 2021-10-08 NOTE — Telephone Encounter (Signed)
Received a letter from [employer] for [employer]. 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 Called pt to see what medication she is taking, no answer. Left message for her to return call.

## 2021-10-08 NOTE — Telephone Encounter (Signed)
Called [facility] spoke with Maren Beach, he corrected pt's medication list in their system.

## 2021-10-08 NOTE — Telephone Encounter (Signed)
Pt called back. She states she is just taking Rosuvastatin  Calcium 10 mg once daily. Will call [facility] to get her medications corrected.

## 2021-10-16 ENCOUNTER — Ambulatory Visit (INDEPENDENT_AMBULATORY_CARE_PROVIDER_SITE_OTHER): Payer: Managed Care, Other (non HMO) | Admitting: Family Medicine

## 2021-10-16 ENCOUNTER — Encounter: Payer: Self-pay | Admitting: Family Medicine

## 2021-10-16 VITALS — BP 110/68 | HR 84 | Temp 98.3°F | Ht 60.5 in | Wt 150.8 lb

## 2021-10-16 DIAGNOSIS — E785 Hyperlipidemia, unspecified: Secondary | ICD-10-CM

## 2021-10-16 DIAGNOSIS — E1165 Type 2 diabetes mellitus with hyperglycemia: Secondary | ICD-10-CM

## 2021-10-16 DIAGNOSIS — I10 Essential (primary) hypertension: Secondary | ICD-10-CM | POA: Diagnosis not present

## 2021-10-16 DIAGNOSIS — Z0000 Encounter for general adult medical examination without abnormal findings: Secondary | ICD-10-CM | POA: Diagnosis not present

## 2021-10-16 NOTE — Progress Notes (Signed)
SANDAR KRINKE DOB: 1956-04-13 Encounter date: 10/16/2021  This is a 65 y.o. female who presents for complete physical   History of present illness/Additional concerns: Last visit with me was via telephone 08/28/21 where we discussed blood sugar management and started metformin. Last A1C was 6.3 on 12/19 down from prior 6.9 on 07/03/21. [id number].  Bp this morning was 117/76 for her and similar in both arms. Typically around that number.  HL: doing ok with the crestor. No problems with this.   Has been walking more regularly.   Hard for her to get in breakfast and not be eating late. Appetite is a little less. Has to remember to eat. Thinks this is related to the metformin. No diarrhea. Does have some indigestion that is bad.   Did get second opinion for hyperparathyroid - was told that indigestion could be side effect of this. She met with Dr. Georgina Peer and is going to meet with surgeon at [facility]. Sees surgeon 1/25.   She is still seeing urology for hematuria. Lesion was seen on kidneys. They did CT to find this. Suggested follow up MRI.she had MRI last week and she was late so has to reschedule visit.   Seeing dr. Renda Rolls. [facility] for derm care [facility] with [facility] Last mammogram 07/2021 normal.  Last colonosocpy 01/2018 with repeat suggested 01/2028.   Past Medical History:  Diagnosis Date   Allergy    more sinus issues    Anemia    yrs past as child , with pregnancy    Arthritis    shoulders - not diagnosed    Headache(784.0)    Hyperlipidemia    Hyperparathyroidism ([place]) 07/28/2021   Hypertension    IGT (impaired glucose tolerance)    Kidney stone    Osteopenia    Plantar fasciitis    Past Surgical History:  Procedure Laterality Date   ABDOMINAL HYSTERECTOMY     fibroids   CESAREAN SECTION     COLONOSCOPY  01/2018   normal   OOPHORECTOMY     dermoid tumor   TONSILLECTOMY AND ADENOIDECTOMY     No Known Allergies Current Meds  Medication Sig    amLODipine (NORVASC) 5 MG tablet TAKE 1 TABLET BY MOUTH EVERY DAY   Azelastine-Fluticasone 137-50 MCG/ACT SUSP Place 1 spray into the nose every 12 (twelve) hours.   blood glucose meter kit and supplies KIT Dispense based on patient and insurance preference. Use up to four times daily as directed. (FOR ICD-9 250.00, 250.01).   blood glucose meter kit and supplies Use as directed once a day   Blood Glucose Monitoring Suppl (CONTOUR BLOOD GLUCOSE SYSTEM) w/Device KIT by Does not apply route.   Cholecalciferol (VITAMIN D) 50 MCG (2000 UT) tablet 1 tablet   Continuous Blood Gluc Sensor (FREESTYLE LIBRE 2 SENSOR) MISC 1 DEVICE BY DOES NOT APPLY ROUTE EVERY 14 (FOURTEEN) DAYS.   CONTOUR NEXT TEST test strip USE AS INSTRUCTED   famciclovir (FAMVIR) 250 MG tablet Take 1 tablet (250 mg total) by mouth 2 (two) times daily as needed.   losartan (COZAAR) 100 MG tablet TAKE 1 TABLET BY MOUTH EVERY DAY   metFORMIN (GLUCOPHAGE) 500 MG tablet Take 1 tablet (500 mg total) by mouth 2 (two) times daily with a meal. Start with 1 tablet daily and increase to twice daily as tolerated.   rosuvastatin (CRESTOR) 10 MG tablet Take 1 tablet (10 mg total) by mouth daily.   [DISCONTINUED] metoprolol tartrate (LOPRESSOR) 50 MG  tablet Take 2 hours before CT scan   Social History   Tobacco Use   Smoking status: Never   Smokeless tobacco: Never  Substance Use Topics   Alcohol use: Yes    Comment: rare glass of wine    Family History  Problem Relation Age of Onset   Cancer Father        prostate   Hypertension Mother    Hyperlipidemia Mother    Thyroid disease Mother    Diabetes Mother    Obesity Sister    Diabetes Sister    Diabetes Other    Hyperlipidemia Other    Hypertension Other    Cancer Other        prostate   Kidney disease Other    Obesity Sister    Heart attack Sister    Diabetes Maternal Grandmother    Colon cancer Neg Hx    Colon polyps Neg Hx    Rectal cancer Neg Hx    Stomach cancer Neg Hx       Review of Systems  Constitutional:  Negative for activity change, appetite change, chills, fatigue, fever and unexpected weight change.  HENT:  Negative for congestion, ear pain, hearing loss, sinus pressure, sinus pain, sore throat and trouble swallowing.   Eyes:  Negative for pain and visual disturbance.  Respiratory:  Negative for cough, chest tightness, shortness of breath and wheezing.   Cardiovascular:  Negative for chest pain, palpitations and leg swelling.  Gastrointestinal:  Negative for abdominal pain, blood in stool, constipation, diarrhea, nausea and vomiting.  Genitourinary:  Negative for difficulty urinating and menstrual problem.  Musculoskeletal:  Negative for arthralgias and back pain.  Skin:  Negative for rash.  Neurological:  Negative for dizziness, weakness, numbness and headaches.  Hematological:  Negative for adenopathy. Does not bruise/bleed easily.  Psychiatric/Behavioral:  Negative for sleep disturbance and suicidal ideas. The patient is not nervous/anxious.    CBC:  Lab Results  Component Value Date   WBC 5.6 12/08/2020   HGB 13.3 12/08/2020   HCT 37.9 12/08/2020   MCH 27.3 08/25/2020   MCHC 34.9 12/08/2020   RDW 14.1 12/08/2020   PLT 271.0 12/08/2020   MPV 11.4 08/25/2020   CMP: Lab Results  Component Value Date   NA 136 10/05/2021   NA 137 09/28/2021   K 3.9 10/05/2021   CL 103 10/05/2021   CO2 26 10/05/2021   GLUCOSE 97 10/05/2021   GLUCOSE 102 (H) 09/26/2006   BUN 15 10/05/2021   BUN 9 09/28/2021   CREATININE 0.79 10/05/2021   CREATININE 0.72 08/25/2020   GFRAA 98 09/28/2007   CALCIUM 11.1 (H) 10/05/2021   PROT 7.9 10/05/2021   BILITOT 0.5 10/05/2021   ALKPHOS 49 10/05/2021   ALT 16 10/05/2021   AST 14 10/05/2021   LIPID: Lab Results  Component Value Date   CHOL 171 10/05/2021   TRIG 100.0 10/05/2021   TRIG 95 09/26/2006   HDL 54.80 10/05/2021   LDLCALC 97 10/05/2021   LDLCALC 116 (H) 08/25/2020    Objective:  BP  110/68    Pulse 84    Temp 98.3 F (36.8 C) (Oral)    Ht 5' 0.5" (1.537 m)    Wt 150 lb 12.8 oz (68.4 kg)    SpO2 96%    BMI 28.97 kg/m   Weight: 150 lb 12.8 oz (68.4 kg)   BP Readings from Last 3 Encounters:  10/16/21 110/68  09/29/21 137/70  08/28/21 118/77   Wt Readings  from Last 3 Encounters:  10/16/21 150 lb 12.8 oz (68.4 kg)  08/28/21 153 lb (69.4 kg)  08/25/21 153 lb 6.4 oz (69.6 kg)    Physical Exam Constitutional:      General: She is not in acute distress.    Appearance: She is well-developed.  HENT:     Head: Normocephalic and atraumatic.     Right Ear: External ear normal.     Left Ear: External ear normal.     Mouth/Throat:     Pharynx: No oropharyngeal exudate.  Eyes:     Conjunctiva/sclera: Conjunctivae normal.     Pupils: Pupils are equal, round, and reactive to light.  Neck:     Thyroid: No thyromegaly.  Cardiovascular:     Rate and Rhythm: Normal rate and regular rhythm.     Heart sounds: Normal heart sounds. No murmur heard.   No friction rub. No gallop.  Pulmonary:     Effort: Pulmonary effort is normal.     Breath sounds: Normal breath sounds.  Abdominal:     General: Bowel sounds are normal. There is no distension.     Palpations: Abdomen is soft. There is no mass.     Tenderness: There is no abdominal tenderness. There is no guarding.     Hernia: No hernia is present.  Musculoskeletal:        General: No tenderness or deformity. Normal range of motion.     Cervical back: Normal range of motion and neck supple.  Lymphadenopathy:     Cervical: No cervical adenopathy.  Skin:    General: Skin is warm and dry.     Findings: No rash.  Neurological:     Mental Status: She is alert and oriented to person, place, and time.     Deep Tendon Reflexes: Reflexes normal.     Reflex Scores:      Tricep reflexes are 2+ on the right side and 2+ on the left side.      Bicep reflexes are 2+ on the right side and 2+ on the left side.      Brachioradialis  reflexes are 2+ on the right side and 2+ on the left side.      Patellar reflexes are 2+ on the right side and 2+ on the left side. Psychiatric:        Speech: Speech normal.        Behavior: Behavior normal.        Thought Content: Thought content normal.    Assessment/Plan: Health Maintenance Due  Topic Date Due   Pneumococcal Vaccine 36-53 Years old (2 - PCV) 07/30/2020   FOOT EXAM  10/28/2020   COVID-19 Vaccine (6 - Booster for Moderna series) 09/14/2021   Health Maintenance reviewed.  Prefers to hold off on Prevnar 20.  Can consider at next office visit.  1. Preventative health care Encouraged regular exercise.  This will help with sugar control overall.  Encouraged her to keep up with healthy eating behaviors.  Blood sugar has significantly improved since last visit.  She is up-to-date with other health measures.  2. Essential hypertension Blood pressure well controlled.  Continue with amlodipine 5 mg, losartan 100 mg. - CBC with Differential/Platelet; Future - Comprehensive metabolic panel; Future  3. Controlled type 2 diabetes mellitus with hyperglycemia, without long-term current use of insulin ([place]) She is tolerating metformin well.  Continue this medication.  Sugars improved.  She has only been on metformin for 1 month, so I expect her A1c  to drop even further with next recheck. - Hemoglobin A1c; Future - Microalbumin / creatinine urine ratio; Future  4. Dyslipidemia Continue with Crestor 10 mg daily.  We discussed LDL goal of less than 80 given diabetes diagnosis.  She does not wish to increase Crestor dose, but will talk about this with cardiology at her follow-up visit. - Lipid panel; Future   Return for bloodwork in 3 months (if desired); CCV in 6 mo.  Micheline Rough, MD

## 2021-10-27 LAB — HM DIABETES EYE EXAM

## 2021-11-12 ENCOUNTER — Telehealth: Payer: Self-pay | Admitting: *Deleted

## 2021-11-12 ENCOUNTER — Encounter: Payer: Self-pay | Admitting: Family Medicine

## 2021-11-12 NOTE — Telephone Encounter (Signed)
-----   Message from Caren Macadam, MD sent at 11/07/2021  8:52 AM EST ----- I had made a note about eye exam from last visit. If she has been getting regular eye exams; we need to get records to put on file since she is diabetic. If not, we can recommend local ophthalmologist:  #Dr. Katy Fitch: [phone] [address] [place] #Dr. Delman Cheadle: [phone] (office just moved to [place], [place])

## 2021-11-12 NOTE — Telephone Encounter (Signed)
Spoke with the patient and she stated she sees Dr Laban Emperor.  I called [facility]-at [phone] and spoke with Erline Levine.  The report was faxed, abstracted and sent to be scanned.

## 2021-11-18 ENCOUNTER — Other Ambulatory Visit: Payer: Self-pay | Admitting: Internal Medicine

## 2021-11-18 ENCOUNTER — Other Ambulatory Visit: Payer: Self-pay | Admitting: Family Medicine

## 2021-11-25 ENCOUNTER — Encounter: Payer: Self-pay | Admitting: Cardiology

## 2021-11-25 ENCOUNTER — Ambulatory Visit: Payer: Managed Care, Other (non HMO) | Admitting: Cardiology

## 2021-11-25 ENCOUNTER — Other Ambulatory Visit: Payer: Self-pay

## 2021-11-25 VITALS — BP 140/70 | HR 67 | Ht 61.0 in | Wt 154.6 lb

## 2021-11-25 DIAGNOSIS — E785 Hyperlipidemia, unspecified: Secondary | ICD-10-CM

## 2021-11-25 DIAGNOSIS — E1165 Type 2 diabetes mellitus with hyperglycemia: Secondary | ICD-10-CM | POA: Diagnosis not present

## 2021-11-25 DIAGNOSIS — Z01810 Encounter for preprocedural cardiovascular examination: Secondary | ICD-10-CM

## 2021-11-25 DIAGNOSIS — I2510 Atherosclerotic heart disease of native coronary artery without angina pectoris: Secondary | ICD-10-CM | POA: Diagnosis not present

## 2021-11-25 NOTE — Patient Instructions (Signed)
Medication Instructions:  Your physician recommends that you continue on your current medications as directed. Please refer to the Current Medication list given to you today.  *If you need a refill on your cardiac medications before your next appointment, please call your pharmacy*   Lab Work: None If you have labs (blood work) drawn today and your tests are completely normal, you will receive your results only by: [place] (if you have MyChart) OR A paper copy in the mail If you have any lab test that is abnormal or we need to change your treatment, we will call you to review the results.   Testing/Procedures: None   Follow-Up: At [facility], you and your health needs are our priority.  As part of our continuing mission to provide you with exceptional heart care, we have created designated Provider Care Teams.  These Care Teams include your primary Cardiologist (physician) and Advanced Practice Providers (APPs -  Physician Assistants and Nurse Practitioners) who all work together to provide you with the care you need, when you need it.  We recommend signing up for the patient portal called "MyChart".  Sign up information is provided on this After Visit Summary.  MyChart is used to connect with patients for Virtual Visits (Telemedicine).  Patients are able to view lab/test results, encounter notes, upcoming appointments, etc.  Non-urgent messages can be sent to your provider as well.   To learn more about what you can do with MyChart, go to [email or web address].    Your next appointment:   6 month(s)  The format for your next appointment:   In Person  Provider:   Berniece Salines, DO     Other Instructions Please take your blood pressure daily as well as heart rate. Send in a MyChart message in 2 weeks with the readings.    Mediterranean Diet A Mediterranean diet refers to food and lifestyle choices that are based on the traditions of countries located on the  [employer]. It focuses on eating more fruits, vegetables, whole grains, beans, nuts, seeds, and heart-healthy fats, and eating less dairy, meat, eggs, and processed foods with added sugar, salt, and fat. This way of eating has been shown to help prevent certain conditions and improve outcomes for people who have chronic diseases, like kidney disease and heart disease. What are tips for following this plan? Reading food labels Check the serving size of packaged foods. For foods such as rice and pasta, the serving size refers to the amount of cooked product, not dry. Check the total fat in packaged foods. Avoid foods that have saturated fat or trans fats. Check the ingredient list for added sugars, such as corn syrup. Shopping  Buy a variety of foods that offer a balanced diet, including: Fresh fruits and vegetables (produce). Grains, beans, nuts, and seeds. Some of these may be available in unpackaged forms or large amounts (in bulk). Fresh seafood. Poultry and eggs. Low-fat dairy products. Buy whole ingredients instead of prepackaged foods. Buy fresh fruits and vegetables in-season from local farmers markets. Buy plain frozen fruits and vegetables. If you do not have access to quality fresh seafood, buy precooked frozen shrimp or canned fish, such as tuna, salmon, or sardines. Stock your pantry so you always have certain foods on hand, such as olive oil, canned tuna, canned tomatoes, rice, pasta, and beans. Cooking Cook foods with extra-virgin olive oil instead of using butter or other vegetable oils. Have meat as a side dish, and have vegetables or grains  as your main dish. This means having meat in small portions or adding small amounts of meat to foods like pasta or stew. Use beans or vegetables instead of meat in common dishes like chili or lasagna. Experiment with different cooking methods. Try roasting, broiling, steaming, and sauting vegetables. Add frozen vegetables to soups,  stews, pasta, or rice. Add nuts or seeds for added healthy fats and plant protein at each meal. You can add these to yogurt, salads, or vegetable dishes. Marinate fish or vegetables using olive oil, lemon juice, garlic, and fresh herbs. Meal planning Plan to eat one vegetarian meal one day each week. Try to work up to two vegetarian meals, if possible. Eat seafood two or more times a week. Have healthy snacks readily available, such as: Vegetable sticks with hummus. Greek yogurt. Fruit and nut trail mix. Eat balanced meals throughout the week. This includes: Fruit: 2-3 servings a day. Vegetables: 4-5 servings a day. Low-fat dairy: 2 servings a day. Fish, poultry, or lean meat: 1 serving a day. Beans and legumes: 2 or more servings a week. Nuts and seeds: 1-2 servings a day. Whole grains: 6-8 servings a day. Extra-virgin olive oil: 3-4 servings a day. Limit red meat and sweets to only a few servings a month. Lifestyle  Cook and eat meals together with your family, when possible. Drink enough fluid to keep your urine pale yellow. Be physically active every day. This includes: Aerobic exercise like running or swimming. Leisure activities like gardening, walking, or housework. Get 7-8 hours of sleep each night. If recommended by your health care provider, drink red wine in moderation. This means 1 glass a day for nonpregnant women and 2 glasses a day for men. A glass of wine equals 5 oz (150 mL). What foods should I eat? Fruits Apples. Apricots. Avocado. Berries. Bananas. Cherries. Dates. Figs. Grapes. Lemons. Melon. Oranges. Peaches. Plums. Pomegranate. Vegetables Artichokes. Beets. Broccoli. Cabbage. Carrots. Eggplant. Green beans. Chard. Kale. Spinach. Onions. Leeks. Peas. Squash. Tomatoes. Peppers. Radishes. Grains Whole-grain pasta. Brown rice. Bulgur wheat. Polenta. Couscous. Whole-wheat bread. [place]. Meats and other proteins Beans. Almonds. Sunflower seeds. Pine  nuts. Peanuts. [place]. Salmon. Scallops. Shrimp. [place]. Tilapia. Clams. Oysters. Eggs. Poultry without skin. Dairy Low-fat milk. Cheese. Greek yogurt. Fats and oils Extra-virgin olive oil. Avocado oil. Grapeseed oil. Beverages Water. Red wine. Herbal tea. Sweets and desserts Greek yogurt with honey. Baked apples. Poached pears. Trail mix. Seasonings and condiments Basil. Cilantro. Coriander. Cumin. Mint. Parsley. Sage. Rosemary. Tarragon. Garlic. Oregano. Thyme. Pepper. Balsamic vinegar. Tahini. Hummus. Tomato sauce. Olives. Mushrooms. The items listed above may not be a complete list of foods and beverages you can eat. Contact a dietitian for more information. What foods should I limit? This is a list of foods that should be eaten rarely or only on special occasions. Fruits Fruit canned in syrup. Vegetables Deep-fried potatoes (french fries). Grains Prepackaged pasta or rice dishes. Prepackaged cereal with added sugar. Prepackaged snacks with added sugar. Meats and other proteins Beef. Pork. Lamb. Poultry with skin. Hot dogs. [place] Salines. Dairy Ice cream. Sour cream. Whole milk. Fats and oils Butter. Canola oil. Vegetable oil. Beef fat (tallow). Lard. Beverages Juice. Sugar-sweetened soft drinks. Beer. Liquor and spirits. Sweets and desserts Cookies. Cakes. Pies. Candy. Seasonings and condiments Mayonnaise. Pre-made sauces and marinades. The items listed above may not be a complete list of foods and beverages you should limit. Contact a dietitian for more information. Summary The Mediterranean diet includes both food and lifestyle choices. Eat a  variety of fresh fruits and vegetables, beans, nuts, seeds, and whole grains. Limit the amount of red meat and sweets that you eat. If recommended by your health care provider, drink red wine in moderation. This means 1 glass a day for nonpregnant women and 2 glasses a day for men. A glass of wine equals 5 oz (150 mL). This information is not  intended to replace advice given to you by your health care provider. Make sure you discuss any questions you have with your health care provider. Document Revised: 11/09/2019 Document Reviewed: 09/06/2019 Elsevier Patient Education  2022 [employer].

## 2021-11-25 NOTE — Progress Notes (Signed)
Cardiology Office Note:    Date:  11/25/2021   ID:  Rebecca Paul, DOB [birth year], MRN [id number]  PCP:  Rebecca Macadam, MD  Cardiologist:  Rebecca Salines, DO  Electrophysiologist:  None   Referring MD: Rebecca Macadam, MD   " I am doing ok"  History of Present Illness:    Rebecca Paul is a 66 y.o. female with a hx of mild coronary artery disease seen on recent coronary CTA, hyperlipidemia, hypertension, hyperparathyroidism pending surgical intervention at [facility], type 2 diabetes here today for follow-up visit.  At her last visit which was on August 25, 2021 she was experiencing chest pain given her risk factors we proceeded with a coronary CTA.  She did get a coronary CTA which showed mild coronary artery disease.  These results were previously also discussed with the patient.  At the time I also increase her Crestor to 10 mg daily.  Today she has not she likes using any chest discomfort.  She is looking forward to her upcoming procedure at [facility].  She denies any shortness of breath as well.   Past Medical History:  Diagnosis Date   Allergy    more sinus issues    Anemia    yrs past as child , with pregnancy    Arthritis    shoulders - not diagnosed    Headache(784.0)    Hyperlipidemia    Hyperparathyroidism ([place]) 07/28/2021   Hypertension    IGT (impaired glucose tolerance)    Kidney stone    Osteopenia    Plantar fasciitis     Past Surgical History:  Procedure Laterality Date   ABDOMINAL HYSTERECTOMY     fibroids   CESAREAN SECTION     COLONOSCOPY  01/2018   normal   OOPHORECTOMY     dermoid tumor   TONSILLECTOMY AND ADENOIDECTOMY      Current Medications: Current Meds  Medication Sig   amLODipine (NORVASC) 5 MG tablet TAKE 1 TABLET BY MOUTH EVERY DAY   Azelastine-Fluticasone 137-50 MCG/ACT SUSP Place 1 spray into the nose every 12 (twelve) hours.   blood glucose meter kit and supplies KIT Dispense based on  patient and insurance preference. Use up to four times daily as directed. (FOR ICD-9 250.00, 250.01).   blood glucose meter kit and supplies Use as directed once a day   Blood Glucose Monitoring Suppl (CONTOUR BLOOD GLUCOSE SYSTEM) w/Device KIT by Does not apply route.   Cholecalciferol (VITAMIN D) 50 MCG (2000 UT) tablet 1 tablet   co-enzyme Q-10 30 MG capsule Take 30 mg by mouth 3 (three) times daily.   Continuous Blood Gluc Sensor (FREESTYLE LIBRE 2 SENSOR) MISC 1 DEVICE BY DOES NOT APPLY ROUTE EVERY 14 (FOURTEEN) DAYS.   CONTOUR NEXT TEST test strip USE AS INSTRUCTED   famciclovir (FAMVIR) 250 MG tablet Take 1 tablet (250 mg total) by mouth 2 (two) times daily as needed.   losartan (COZAAR) 100 MG tablet TAKE 1 TABLET BY MOUTH EVERY DAY   metFORMIN (GLUCOPHAGE) 500 MG tablet Take 1 tablet (500 mg total) by mouth 2 (two) times daily with a meal. Start with 1 tablet daily and increase to twice daily as tolerated.   rosuvastatin (CRESTOR) 10 MG tablet Take 1 tablet (10 mg total) by mouth daily.     Allergies:   Dust mite extract   Social History   Socioeconomic History   Marital status: Widowed    Spouse name: Not on file  Number of children: Not on file   Years of education: Not on file   Highest education level: Not on file  Occupational History   Not on file  Tobacco Use   Smoking status: Never   Smokeless tobacco: Never  Vaping Use   Vaping Use: Never used  Substance and Sexual Activity   Alcohol use: Yes    Comment: rare glass of wine    Drug use: No   Sexual activity: Not Currently  Other Topics Concern   Not on file  Social History Narrative   Not on file   Social Determinants of Health   Financial Resource Strain: Not on file  Food Insecurity: Not on file  Transportation Needs: Not on file  Physical Activity: Not on file  Stress: Not on file  Social Connections: Not on file     Family History: The patient's family history includes Cancer in her father and  another family member; Diabetes in her maternal grandmother, mother, sister, and another family member; Heart attack in her sister; Hyperlipidemia in her mother and another family member; Hypertension in her mother and another family member; Kidney disease in an other family member; Obesity in her sister and sister; Thyroid disease in her mother. There is no history of Colon cancer, Colon polyps, Rectal cancer, or Stomach cancer.  ROS:   Review of Systems  Constitution: Negative for decreased appetite, fever and weight gain.  HENT: Negative for congestion, ear discharge, hoarse voice and sore throat.   Eyes: Negative for discharge, redness, vision loss in right eye and visual halos.  Cardiovascular: Negative for chest pain, dyspnea on exertion, leg swelling, orthopnea and palpitations.  Respiratory: Negative for cough, hemoptysis, shortness of breath and snoring.   Endocrine: Negative for heat intolerance and polyphagia.  Hematologic/Lymphatic: Negative for bleeding problem. Does not bruise/bleed easily.  Skin: Negative for flushing, nail changes, rash and suspicious lesions.  Musculoskeletal: Negative for arthritis, joint pain, muscle cramps, myalgias, neck pain and stiffness.  Gastrointestinal: Negative for abdominal pain, bowel incontinence, diarrhea and excessive appetite.  Genitourinary: Negative for decreased libido, genital sores and incomplete emptying.  Neurological: Negative for brief paralysis, focal weakness, headaches and loss of balance.  Psychiatric/Behavioral: Negative for altered mental status, depression and suicidal ideas.  Allergic/Immunologic: Negative for HIV exposure and persistent infections.    EKGs/Labs/Other Studies Reviewed:    The following studies were reviewed today:   EKG:  None today   CCTA 09/29/2021 FINDINGS: Coronary calcium score: The patient's coronary artery calcium score is 92, which places the patient in the 86 percentile.   Coronary arteries:  Normal coronary origins.  Co- dominance.   Right Coronary Artery: Normal caliber vessel. Predominantly noncalcified plaque in the proximal to mid RCA with 25-49% stenosis.   Left Main Coronary Artery: Normal caliber vessel. No significant plaque or stenosis.   Left Anterior Descending Coronary Artery: Normal caliber vessel. Trivial focal calcified plaque in proximal and mid LAD with 1-24% stenosis. Gives rise to small first diagonal branch.   Left Circumflex Artery: Normal caliber vessel. Noncalcified plaque in proximal LCx near left atrial appendage with 25-49% stenosis. Mixed calcified and noncalcified plaque in the mid LCx with 25-49% stenosis. Gives rise to large branching first and normal second and third OM branches.   Aorta: Normal size, 27 mm at the mid ascending aorta (level of the PA bifurcation) measured double oblique. No calcifications. No dissection seen in visualized portions of the aorta.   Aortic Valve: No calcifications. Trileaflet.  Other findings:   Normal pulmonary vein drainage into the left atrium.   Normal left atrial appendage without a thrombus.   Normal size of the pulmonary artery.   Normal appearance of the pericardium.   IMPRESSION: 1.  Mild non-obstructive CAD, CADRADS = 2.   2. Coronary calcium score of 92. This was 86th percentile for age and sex matched control.   3. Normal coronary origin with co- dominance.   Moderate signal to noise artifact noted.   INTERPRETATION:   1. CAD-RADS 0: No evidence of CAD (0%). Consider non-atherosclerotic causes of chest pain.   2. CAD-RADS 1: Minimal non-obstructive CAD (0-24%). Consider non-atherosclerotic causes of chest pain. Consider preventive therapy and risk factor modification.   3. CAD-RADS 2: Mild non-obstructive CAD (25-49%). Consider non-atherosclerotic causes of chest pain. Consider preventive therapy and risk factor modification.   4. CAD-RADS 3: Moderate stenosis (50-69%).  Consider symptom-guided anti-ischemic pharmacotherapy as well as risk factor modification per guideline directed care. Additional analysis with CT FFR will be submitted.   5. CAD-RADS 4: Severe stenosis. (70-99% or > 50% left main). Cardiac catheterization or CT FFR is recommended. Consider symptom-guided anti-ischemic pharmacotherapy as well as risk factor modification per guideline directed care. Invasive coronary angiography recommended with revascularization per published guideline statements.   6. CAD-RADS 5: Total coronary occlusion (100%). Consider cardiac catheterization or viability assessment. Consider symptom-guided anti-ischemic pharmacotherapy as well as risk factor modification per guideline directed care.   7. CAD-RADS N: Non-diagnostic study. Obstructive CAD can't be excluded. Alternative evaluation is recommended.     Electronically Signed   By: Buford Dresser M.D.   On: 09/29/2021 22:15    Addended by Buford Dresser, MD on 09/29/2021 10:17 PM   Study Result  Narrative & Impression  EXAM: OVER-READ INTERPRETATION  CT CHEST   The following report is an over-read performed by radiologist Dr. Abigail Miyamoto of [facility] [place] on 09/29/2021. This over-read does not include interpretation of cardiac or coronary anatomy or pathology. The coronary CTA interpretation by the cardiologist is attached.   COMPARISON:  12/22/2011 chest radiograph   FINDINGS: Vascular: Tortuous thoracic aorta. Normal aortic caliber. No central pulmonary embolism, on this non-dedicated study.   Mediastinum/Nodes: No imaged thoracic adenopathy.   Lungs/Pleura: No pleural fluid.  Clear imaged lungs.   Upper Abdomen: Normal imaged portions of the liver, spleen, stomach.   Musculoskeletal: No acute osseous abnormality.   IMPRESSION: No acute findings in the imaged extracardiac chest.   Electronically Signed: By: Abigail Miyamoto M.D. On: 09/29/2021 15:12      Recent Labs: 12/08/2020: Hemoglobin 13.3; Platelets 271.0 09/28/2021: Magnesium 2.0 10/05/2021: ALT 16; BUN 15; Creatinine, Ser 0.79; Potassium 3.9; Sodium 136  Recent Lipid Panel    Component Value Date/Time   CHOL 171 10/05/2021 1015   TRIG 100.0 10/05/2021 1015   TRIG 95 09/26/2006 1002   HDL 54.80 10/05/2021 1015   CHOLHDL 3 10/05/2021 1015   VLDL 20.0 10/05/2021 1015   LDLCALC 97 10/05/2021 1015   LDLCALC 116 (H) 08/25/2020 1158   LDLDIRECT 140.3 12/04/2012 1039    Physical Exam:    VS:  BP 140/70 (BP Location: Left Arm, Patient Position: Sitting)    Pulse 67    Ht _0  (1.549 m)    Wt 154 lb 9.6 oz (70.1 kg)    SpO2 95%    BMI 29.21 kg/m     Wt Readings from Last 3 Encounters:  11/25/21 154 lb 9.6 oz (70.1 kg)  10/16/21 150 lb 12.8 oz (68.4 kg)  08/28/21 153 lb (69.4 kg)     GEN: Well nourished, well developed in no acute distress HEENT: Normal NECK: No JVD; No carotid bruits LYMPHATICS: No lymphadenopathy CARDIAC: S1S2 noted,RRR, no murmurs, rubs, gallops RESPIRATORY:  Clear to auscultation without rales, wheezing or rhonchi  ABDOMEN: Soft, non-tender, non-distended, +bowel sounds, no guarding. EXTREMITIES: No edema, No cyanosis, no clubbing MUSCULOSKELETAL:  No deformity  SKIN: Warm and dry NEUROLOGIC:  Alert and oriented x 3, non-focal PSYCHIATRIC:  Normal affect, good insight  ASSESSMENT:    1. Mild CAD   2. Hyperlipidemia, unspecified hyperlipidemia type   3. Controlled type 2 diabetes mellitus with hyperglycemia, without long-term current use of insulin ([place])   4. Preoperative cardiovascular examination    PLAN:    We discussed her coronary CT results which show mild coronary artery disease.  Aspirin 81 mg daily and Crestor 10 mg daily which she is tolerating well and will be continued.  We will get lipid profile to assess her LDL her new goal is less than 70.  If her blood work shows greater than 70 plan to increase Crestor to 20 mg  daily.  Diabetes mellitus is being managed by her primary provider.   Blood pressure is slightly elevated, continue with current antihypertensive regimen.  However she is can check her blood pressure daily and send me that information in 2 weeks.  She did continue to be greater than 130/80 mmHg we will adjust her antihypertensive medication.  The patient does not have any unstable cardiac conditions.  Upon evaluation today, she can achieve 4 METs or greater without anginal symptoms.  According to [facility] and AHA guidelines, she requires no further cardiac workup prior to her noncardiac surgery and should be at acceptable risk.  Our service is available as necessary in the perioperative period.  The patient is in agreement with the above plan. The patient left the office in stable condition.  The patient will follow up in   Medication Adjustments/Labs and Tests Ordered: Current medicines are reviewed at length with the patient today.  Concerns regarding medicines are outlined above.  Orders Placed This Encounter  Procedures   Lipid panel   No orders of the defined types were placed in this encounter.   Patient Instructions  Medication Instructions:  Your physician recommends that you continue on your current medications as directed. Please refer to the Current Medication list given to you today.  *If you need a refill on your cardiac medications before your next appointment, please call your pharmacy*   Lab Work: None If you have labs (blood work) drawn today and your tests are completely normal, you will receive your results only by: [place] (if you have MyChart) OR A paper copy in the mail If you have any lab test that is abnormal or we need to change your treatment, we will call you to review the results.   Testing/Procedures: None   Follow-Up: At [facility], you and your health needs are our priority.  As part of our continuing mission to provide you with  exceptional heart care, we have created designated Provider Care Teams.  These Care Teams include your primary Cardiologist (physician) and Advanced Practice Providers (APPs -  Physician Assistants and Nurse Practitioners) who all work together to provide you with the care you need, when you need it.  We recommend signing up for the patient portal called "MyChart".  Sign up information is provided on this  After Visit Summary.  MyChart is used to connect with patients for Virtual Visits (Telemedicine).  Patients are able to view lab/test results, encounter notes, upcoming appointments, etc.  Non-urgent messages can be sent to your provider as well.   To learn more about what you can do with MyChart, go to [email or web address].    Your next appointment:   6 month(s)  The format for your next appointment:   In Person  Provider:   Berniece Salines, DO     Other Instructions Please take your blood pressure daily as well as heart rate. Send in a MyChart message in 2 weeks with the readings.    Mediterranean Diet A Mediterranean diet refers to food and lifestyle choices that are based on the traditions of countries located on the [employer]. It focuses on eating more fruits, vegetables, whole grains, beans, nuts, seeds, and heart-healthy fats, and eating less dairy, meat, eggs, and processed foods with added sugar, salt, and fat. This way of eating has been shown to help prevent certain conditions and improve outcomes for people who have chronic diseases, like kidney disease and heart disease. What are tips for following this plan? Reading food labels Check the serving size of packaged foods. For foods such as rice and pasta, the serving size refers to the amount of cooked product, not dry. Check the total fat in packaged foods. Avoid foods that have saturated fat or trans fats. Check the ingredient list for added sugars, such as corn syrup. Shopping  Buy a variety of foods that offer a  balanced diet, including: Fresh fruits and vegetables (produce). Grains, beans, nuts, and seeds. Some of these may be available in unpackaged forms or large amounts (in bulk). Fresh seafood. Poultry and eggs. Low-fat dairy products. Buy whole ingredients instead of prepackaged foods. Buy fresh fruits and vegetables in-season from local farmers markets. Buy plain frozen fruits and vegetables. If you do not have access to quality fresh seafood, buy precooked frozen shrimp or canned fish, such as tuna, salmon, or sardines. Stock your pantry so you always have certain foods on hand, such as olive oil, canned tuna, canned tomatoes, rice, pasta, and beans. Cooking Cook foods with extra-virgin olive oil instead of using butter or other vegetable oils. Have meat as a side dish, and have vegetables or grains as your main dish. This means having meat in small portions or adding small amounts of meat to foods like pasta or stew. Use beans or vegetables instead of meat in common dishes like chili or lasagna. Experiment with different cooking methods. Try roasting, broiling, steaming, and sauting vegetables. Add frozen vegetables to soups, stews, pasta, or rice. Add nuts or seeds for added healthy fats and plant protein at each meal. You can add these to yogurt, salads, or vegetable dishes. Marinate fish or vegetables using olive oil, lemon juice, garlic, and fresh herbs. Meal planning Plan to eat one vegetarian meal one day each week. Try to work up to two vegetarian meals, if possible. Eat seafood two or more times a week. Have healthy snacks readily available, such as: Vegetable sticks with hummus. Greek yogurt. Fruit and nut trail mix. Eat balanced meals throughout the week. This includes: Fruit: 2-3 servings a day. Vegetables: 4-5 servings a day. Low-fat dairy: 2 servings a day. Fish, poultry, or lean meat: 1 serving a day. Beans and legumes: 2 or more servings a week. Nuts and seeds: 1-2  servings a day. Whole grains: 6-8 servings a day. Extra-virgin olive oil:  3-4 servings a day. Limit red meat and sweets to only a few servings a month. Lifestyle  Cook and eat meals together with your family, when possible. Drink enough fluid to keep your urine pale yellow. Be physically active every day. This includes: Aerobic exercise like running or swimming. Leisure activities like gardening, walking, or housework. Get 7-8 hours of sleep each night. If recommended by your health care provider, drink red wine in moderation. This means 1 glass a day for nonpregnant women and 2 glasses a day for men. A glass of wine equals 5 oz (150 mL). What foods should I eat? Fruits Apples. Apricots. Avocado. Berries. Bananas. Cherries. Dates. Figs. Grapes. Lemons. Melon. Oranges. Peaches. Plums. Pomegranate. Vegetables Artichokes. Beets. Broccoli. Cabbage. Carrots. Eggplant. Green beans. Chard. Kale. Spinach. Onions. Leeks. Peas. Squash. Tomatoes. Peppers. Radishes. Grains Whole-grain pasta. Brown rice. Bulgur wheat. Polenta. Couscous. Whole-wheat bread. [place]. Meats and other proteins Beans. Almonds. Sunflower seeds. Pine nuts. Peanuts. [place]. Salmon. Scallops. Shrimp. [place]. Tilapia. Clams. Oysters. Eggs. Poultry without skin. Dairy Low-fat milk. Cheese. Greek yogurt. Fats and oils Extra-virgin olive oil. Avocado oil. Grapeseed oil. Beverages Water. Red wine. Herbal tea. Sweets and desserts Greek yogurt with honey. Baked apples. Poached pears. Trail mix. Seasonings and condiments Basil. Cilantro. Coriander. Cumin. Mint. Parsley. Sage. Rosemary. Tarragon. Garlic. Oregano. Thyme. Pepper. Balsamic vinegar. Tahini. Hummus. Tomato sauce. Olives. Mushrooms. The items listed above may not be a complete list of foods and beverages you can eat. Contact a dietitian for more information. What foods should I limit? This is a list of foods that should be eaten rarely or only on special  occasions. Fruits Fruit canned in syrup. Vegetables Deep-fried potatoes (french fries). Grains Prepackaged pasta or rice dishes. Prepackaged cereal with added sugar. Prepackaged snacks with added sugar. Meats and other proteins Beef. Pork. Lamb. Poultry with skin. Hot dogs. Rebecca Paul. Dairy Ice cream. Sour cream. Whole milk. Fats and oils Butter. Canola oil. Vegetable oil. Beef fat (tallow). Lard. Beverages Juice. Sugar-sweetened soft drinks. Beer. Liquor and spirits. Sweets and desserts Cookies. Cakes. Pies. Candy. Seasonings and condiments Mayonnaise. Pre-made sauces and marinades. The items listed above may not be a complete list of foods and beverages you should limit. Contact a dietitian for more information. Summary The Mediterranean diet includes both food and lifestyle choices. Eat a variety of fresh fruits and vegetables, beans, nuts, seeds, and whole grains. Limit the amount of red meat and sweets that you eat. If recommended by your health care provider, drink red wine in moderation. This means 1 glass a day for nonpregnant women and 2 glasses a day for men. A glass of wine equals 5 oz (150 mL). This information is not intended to replace advice given to you by your health care provider. Make sure you discuss any questions you have with your health care provider. Document Revised: 11/09/2019 Document Reviewed: 09/06/2019 Elsevier Patient Education  2022 [place].    Adopting a Healthy Lifestyle.  Know what a healthy weight is for you (roughly BMI <25) and aim to maintain this   Aim for 7+ servings of fruits and vegetables daily   65-80+ fluid ounces of water or unsweet tea for healthy kidneys   Limit to max 1 drink of alcohol per day; avoid smoking/tobacco   Limit animal fats in diet for cholesterol and heart health - choose grass fed whenever available   Avoid highly processed foods, and foods high in saturated/trans fats   Aim for low stress - take time  to  unwind and care for your mental health   Aim for 150 min of moderate intensity exercise weekly for heart health, and weights twice weekly for bone health   Aim for 7-9 hours of sleep daily   When it comes to diets, agreement about the perfect plan isnt easy to find, even among the experts. Experts at the [employer] developed an idea known as the Healthy Eating Plate. Just imagine a plate divided into logical, healthy portions.   The emphasis is on diet quality:   Load up on vegetables and fruits - one-half of your plate: Aim for color and variety, and remember that potatoes dont count.   Go for whole grains - one-quarter of your plate: Whole wheat, barley, wheat berries, quinoa, oats, brown rice, and foods made with them. If you want pasta, go with whole wheat pasta.   Protein power - one-quarter of your plate: Fish, chicken, beans, and nuts are all healthy, versatile protein sources. Limit red meat.   The diet, however, does go beyond the plate, offering a few other suggestions.   Use healthy plant oils, such as olive, canola, soy, corn, sunflower and peanut. Check the labels, and avoid partially hydrogenated oil, which have unhealthy trans fats.   If youre thirsty, drink water. Coffee and tea are good in moderation, but skip sugary drinks and limit milk and dairy products to one or two daily servings.   The type of carbohydrate in the diet is more important than the amount. Some sources of carbohydrates, such as vegetables, fruits, whole grains, and beans-are healthier than others.   Finally, stay active  Signed, Rebecca Salines, DO  11/25/2021 12:11 PM    [facility]

## 2021-12-01 ENCOUNTER — Ambulatory Visit
Admission: RE | Admit: 2021-12-01 | Discharge: 2021-12-01 | Disposition: A | Discharge status: Home / Self Care | Payer: Managed Care, Other (non HMO) | Source: Ambulatory Visit | Attending: Urology | Admitting: Urology

## 2021-12-01 DIAGNOSIS — D4102 Neoplasm of uncertain behavior of left kidney: Secondary | ICD-10-CM

## 2021-12-01 MED ORDER — GADOBENATE DIMEGLUMINE 529 MG/ML IV SOLN
15.0000 mL | Freq: Once | INTRAVENOUS | Status: AC | PRN
  Administered 2021-12-01: 15 mL via INTRAVENOUS

## 2021-12-12 ENCOUNTER — Other Ambulatory Visit: Payer: Self-pay | Admitting: Internal Medicine

## 2021-12-15 LAB — LIPID PANEL
Chol/HDL Ratio: 3.2 ratio (ref 0.0–4.4)
Cholesterol, Total: 191 mg/dL (ref 100–199)
HDL: 60 mg/dL (ref >39)
LDL Chol Calc (NIH): 119 mg/dL — ABNORMAL HIGH (ref 0–99)
Triglycerides: 63 mg/dL (ref 0–149)
VLDL Cholesterol Cal: 12 mg/dL (ref 5–40)

## 2021-12-23 ENCOUNTER — Telehealth: Payer: Self-pay | Admitting: Cardiology

## 2021-12-23 NOTE — Telephone Encounter (Signed)
Patient was calling to get results from her labs 12/14/21 ?

## 2021-12-23 NOTE — Telephone Encounter (Signed)
Contacted patient, she was advised of LIPID panel- she had questions in regards to any changes to medications. I advised no changes were recommended based on the labs from recent LIPID.  ? ?Patient also mentions she will be sending over the information for her blood pressures that were requested. I advised I would notify nurse as this will come as mychart message.  ? ?Patient thankful for callback. ? ?

## 2021-12-28 ENCOUNTER — Other Ambulatory Visit: Payer: Self-pay | Admitting: Family Medicine

## 2022-01-01 ENCOUNTER — Other Ambulatory Visit (INDEPENDENT_AMBULATORY_CARE_PROVIDER_SITE_OTHER): Payer: Managed Care, Other (non HMO)

## 2022-01-01 DIAGNOSIS — I10 Essential (primary) hypertension: Secondary | ICD-10-CM | POA: Diagnosis not present

## 2022-01-01 DIAGNOSIS — E1165 Type 2 diabetes mellitus with hyperglycemia: Secondary | ICD-10-CM | POA: Diagnosis not present

## 2022-01-01 LAB — COMPREHENSIVE METABOLIC PANEL
ALT: 13 U/L (ref 0–35)
AST: 17 U/L (ref 0–37)
Albumin: 4.4 g/dL (ref 3.5–5.2)
Alkaline Phosphatase: 50 U/L (ref 39–117)
BUN: 13 mg/dL (ref 6–23)
CO2: 28 mEq/L (ref 19–32)
Calcium: 11 mg/dL — ABNORMAL HIGH (ref 8.4–10.5)
Chloride: 105 mEq/L (ref 96–112)
Creatinine, Ser: 0.78 mg/dL (ref 0.40–1.20)
GFR: 80.11 mL/min (ref >60.00)
Glucose, Bld: 100 mg/dL — ABNORMAL HIGH (ref 70–99)
Potassium: 4.1 mEq/L (ref 3.5–5.1)
Sodium: 138 mEq/L (ref 135–145)
Total Bilirubin: 0.5 mg/dL (ref 0.2–1.2)
Total Protein: 8 g/dL (ref 6.0–8.3)

## 2022-01-01 LAB — MICROALBUMIN / CREATININE URINE RATIO
Creatinine,U: 94.7 mg/dL
Microalb Creat Ratio: 1.1 mg/g (ref 0.0–30.0)
Microalb, Ur: 1 mg/dL (ref 0.0–1.9)

## 2022-01-01 LAB — CBC WITH DIFFERENTIAL/PLATELET
Basophils Absolute: 0 10*3/uL (ref 0.0–0.1)
Basophils Relative: 0.2 % (ref 0.0–3.0)
Eosinophils Absolute: 0.1 10*3/uL (ref 0.0–0.7)
Eosinophils Relative: 1.3 % (ref 0.0–5.0)
HCT: 38.1 % (ref 36.0–46.0)
Hemoglobin: 13 g/dL (ref 12.0–15.0)
Lymphocytes Relative: 33.5 % (ref 12.0–46.0)
Lymphs Abs: 2 10*3/uL (ref 0.7–4.0)
MCHC: 34 g/dL (ref 30.0–36.0)
MCV: 79.9 fl (ref 78.0–100.0)
Monocytes Absolute: 0.4 10*3/uL (ref 0.1–1.0)
Monocytes Relative: 7.4 % (ref 3.0–12.0)
Neutro Abs: 3.4 10*3/uL (ref 1.4–7.7)
Neutrophils Relative %: 57.6 % (ref 43.0–77.0)
Platelets: 260 10*3/uL (ref 150.0–400.0)
RBC: 4.77 Mil/uL (ref 3.87–5.11)
RDW: 14.2 % (ref 11.5–15.5)
WBC: 5.9 10*3/uL (ref 4.0–10.5)

## 2022-01-01 LAB — HEMOGLOBIN A1C: Hgb A1c MFr Bld: 6.2 % (ref 4.6–6.5)

## 2022-01-04 ENCOUNTER — Other Ambulatory Visit: Payer: Self-pay | Admitting: Internal Medicine

## 2022-01-04 ENCOUNTER — Telehealth: Payer: Self-pay | Admitting: Family Medicine

## 2022-01-04 NOTE — Telephone Encounter (Signed)
Pt request refills on Losartan 100 mg tablet. Pt prefers for PCP to fill these medications as she states she is no longer under care of prescribing physician.  ?

## 2022-01-04 NOTE — Telephone Encounter (Signed)
Pt call and want a call back  to talk about her bp medication . ?

## 2022-01-06 ENCOUNTER — Other Ambulatory Visit: Payer: Self-pay | Admitting: Family Medicine

## 2022-01-06 MED ORDER — LOSARTAN POTASSIUM 100 MG PO TABS: 100.0000 mg | ORAL_TABLET | Freq: Every day | ORAL | 1 refills | Status: DC

## 2022-01-06 NOTE — Telephone Encounter (Signed)
Refill sent.

## 2022-01-07 NOTE — Telephone Encounter (Signed)
Noted  

## 2022-01-15 ENCOUNTER — Other Ambulatory Visit: Payer: Managed Care, Other (non HMO)

## 2022-01-19 ENCOUNTER — Telehealth: Payer: Self-pay | Admitting: Cardiology

## 2022-01-19 NOTE — Telephone Encounter (Signed)
Pt returning call. Please advise. °

## 2022-01-19 NOTE — Telephone Encounter (Signed)
Called pt to update her addres.. The address is correct in the computer. Pt asked about her lab results she states her Lipids are actually worst than they were in Dec 2022. Pt made aware we can not see those results. I requested the labs from her PCP. Pt thanked me for calling her back, she states she will send her blood pressure readings in a MyChart message when she gets home from vacation.  ?

## 2022-01-19 NOTE — Telephone Encounter (Signed)
Returned mail for labs orders. Called pt 01/19/2022 LVM for patient to call back and update  address. Placed returned mail in box upfront.,awaiting returned call from patient .  ?

## 2022-01-27 ENCOUNTER — Telehealth: Payer: Self-pay | Admitting: Cardiology

## 2022-01-27 NOTE — Telephone Encounter (Signed)
Patient calling to find out if her lab work on her cholesterol has been received from her PCP.  ?

## 2022-01-27 NOTE — Telephone Encounter (Signed)
Contacted patient, unable to reach or leave a message.  ?Will route to primary nurse to see if labs were received.  ? ?Thanks! ?

## 2022-02-05 ENCOUNTER — Encounter: Payer: Self-pay | Admitting: Family Medicine

## 2022-02-05 ENCOUNTER — Telehealth (INDEPENDENT_AMBULATORY_CARE_PROVIDER_SITE_OTHER): Payer: Managed Care, Other (non HMO) | Admitting: Family Medicine

## 2022-02-05 DIAGNOSIS — E1165 Type 2 diabetes mellitus with hyperglycemia: Secondary | ICD-10-CM

## 2022-02-05 DIAGNOSIS — E785 Hyperlipidemia, unspecified: Secondary | ICD-10-CM

## 2022-02-05 DIAGNOSIS — E213 Hyperparathyroidism, unspecified: Secondary | ICD-10-CM

## 2022-02-05 DIAGNOSIS — M85852 Other specified disorders of bone density and structure, left thigh: Secondary | ICD-10-CM

## 2022-02-05 DIAGNOSIS — M85851 Other specified disorders of bone density and structure, right thigh: Secondary | ICD-10-CM

## 2022-02-05 DIAGNOSIS — I10 Essential (primary) hypertension: Secondary | ICD-10-CM | POA: Diagnosis not present

## 2022-02-05 MED ORDER — FAMCICLOVIR 250 MG PO TABS: 250.0000 mg | ORAL_TABLET | Freq: Two times a day (BID) | ORAL | 1 refills | Status: DC | PRN

## 2022-02-05 MED ORDER — METFORMIN HCL 500 MG PO TABS: 500.0000 mg | ORAL_TABLET | Freq: Two times a day (BID) | ORAL | 2 refills | Status: DC

## 2022-02-05 MED ORDER — ROSUVASTATIN CALCIUM 20 MG PO TABS: 20.0000 mg | ORAL_TABLET | Freq: Every day | ORAL | 2 refills | Status: DC

## 2022-02-05 NOTE — Progress Notes (Signed)
?Virtual Visit via Telephone Note ? ?I connected with Rebecca Paul on 02/05/22 at 11:30 AM EDT ?by telephone and verified that I am speaking with the correct person using two identifiers. ?  ?I discussed the limitations, risks, security and privacy concerns of performing an evaluation and management service by telephone and the availability of in person appointments. I also discussed with the patient that there may be a patient responsible charge related to this service. The patient expressed understanding and agreed to proceed. ? ?Location patient: home ?Location provider:  ?[place]  ?[address]  ?[place], [place] [address] ? ?Participants present for the call: patient, provider ?Patient did not have a visit in the prior 7 days to address this/these issue(s). ? ?Rebecca Paul ?DOB: [birth year] ?Encounter date: 02/05/2022 ? ?This is a 66 y.o. female who presents with ?Chief Complaint  ?Patient presents with  ? Results  ?  Patient requests to discuss lab test results performed here and at the cardiology office  ? ? ?History of present illness: ?Bloodwork 01/01/22: normal cbc, glucose at 100, normal renal function with GFR 80, calcium elevated at 11 (following with endo), A1C improved at 6.2 (down from 6.3 in December and down from 6.9 in September),normal microalbumin. ? ?Bp this morning 152/82, repeat 148/72. Has been recording last few months. Running a little high. Doesn't think it is machine.  ? ?Sometimes not hungry with metformin and has to force self to eat. Gets a little headache sometimes, but thinks more related to not eating rather than bp being elevated.  ? ?Has been consistently taking cholesterol medication since February. Surprised that numbers aren't looking better.  ? ?Hyperparathyroid: having surgery at [facility] 5/8. Had renal cyst, stones. MRI renal was ok. All records scanned into chart  ? ? ?Allergies  ?Allergen Reactions  ? Dust Mite Extract Other (See Comments)  ?  Congestion   ? ?Current Meds  ?Medication Sig  ? amLODipine (NORVASC) 5 MG tablet TAKE 1 TABLET BY MOUTH EVERY DAY  ? Azelastine-Fluticasone 137-50 MCG/ACT SUSP Place 1 spray into the nose every 12 (twelve) hours.  ? blood glucose meter kit and supplies KIT Dispense based on patient and insurance preference. Use up to four times daily as directed. (FOR ICD-9 250.00, 250.01).  ? blood glucose meter kit and supplies Use as directed once a day  ? Blood Glucose Monitoring Suppl (CONTOUR BLOOD GLUCOSE SYSTEM) w/Device KIT by Does not apply route.  ? Cholecalciferol (VITAMIN D) 50 MCG (2000 UT) tablet 1 tablet  ? co-enzyme Q-10 30 MG capsule Take 30 mg by mouth 3 (three) times daily.  ? Continuous Blood Gluc Sensor (FREESTYLE LIBRE 2 SENSOR) MISC 1 DEVICE BY DOES NOT APPLY ROUTE EVERY 14 (FOURTEEN) DAYS.  ? CONTOUR NEXT TEST test strip USE AS INSTRUCTED  ? losartan (COZAAR) 100 MG tablet Take 1 tablet (100 mg total) by mouth daily.  ? [DISCONTINUED] famciclovir (FAMVIR) 250 MG tablet Take 1 tablet (250 mg total) by mouth 2 (two) times daily as needed.  ? [DISCONTINUED] metFORMIN (GLUCOPHAGE) 500 MG tablet Take 1 tablet (500 mg total) by mouth 2 (two) times daily with a meal. Start with 1 tablet daily and increase to twice daily as tolerated.  ? rosuvastatin (CRESTOR) 20 MG tablet Take 1 tablet (20 mg total) by mouth daily.  ? ? ?Review of Systems  ?Constitutional:  Negative for chills, fatigue and fever.  ?Respiratory:  Negative for cough, chest tightness, shortness of breath and wheezing.   ?  Cardiovascular:  Negative for chest pain, palpitations and leg swelling.  ?Psychiatric/Behavioral:    ?     She is anxious about upcoming surgery.  ? ?Objective: ? ?There were no vitals taken for this visit.     ? ?BP Readings from Last 3 Encounters:  ?11/25/21 140/70  ?10/16/21 110/68  ?09/29/21 137/70  ? ?Wt Readings from Last 3 Encounters:  ?11/25/21 154 lb 9.6 oz (70.1 kg)  ?10/16/21 150 lb 12.8 oz (68.4 kg)  ?08/28/21 153 lb (69.4 kg)   ? ? ?Observations/Objective: ?Patient sounds cheerful and well on the phone. ?I do not appreciate any SOB. ?Speech and thought processing are grossly intact. ?Patient reported vitals:see hpi ? ? ?Assessment/Plan ?1. Essential hypertension ?Blood pressure has been more elevated.  She is not checking on a very regular basis, but has been instructed to do so by cardiology and plans to start.  She does not want to make changes to blood pressure medication prior to upcoming surgery.  She will continue to monitor blood pressure before surgery and after.  She was previously on hydrochlorothiazide, but this was held due to calcium evaluations for monitoring of hyperparathyroidism.  Blood pressure was well controlled when she was on losartan-hydrochlorothiazide combination.  We could increase amlodipine, but I do have concerns that she may have increased swelling in lower extremities as a side effect.  I have asked her to talk with endocrinology to see if HCTZ could be added back on board perhaps postoperatively if blood pressures remain elevated.  She would like to have a follow-up visit in 1 month to rediscuss blood pressure and see where her numbers are. ? ?2. Controlled type 2 diabetes mellitus with hyperglycemia, without long-term current use of insulin ([place]) ?Appetite is less, but still not maximizing lifestyle changes.  Feels she can do a little bit better.  She is tolerating metformin.  We discussed timing of taking medication today.  She would like to repeat A1c although it is a little bit early for recheck prior to next visit. ?- metFORMIN (GLUCOPHAGE) 500 MG tablet; Take 1 tablet (500 mg total) by mouth 2 (two) times daily with a meal. Start with 1 tablet daily and increase to twice daily as tolerated.  Dispense: 180 tablet; Refill: 2 ?- Hemoglobin A1c; Future ? ?3. Hyperparathyroidism ([place]) ?She is having parathyroidectomy May 8. ? ?4. Osteopenia of necks of both femurs ?Working towards weightbearing exercise.   This is monitored by gynecology regularly. ? ?5. Dyslipidemia ?LDL is elevated.  She has been taking her Crestor regularly for 2 months without significant improvement in LDL.  We discussed that with diabetes, LDL goal is actually much less.  Recommended she increase Crestor to 20 mg to meet this goal.  She will wait until after surgery to start this. ?- rosuvastatin (CRESTOR) 20 MG tablet; Take 1 tablet (20 mg total) by mouth daily.  Dispense: 90 tablet; Refill: 2 ? ? ?Return in about 1 month (around 03/07/2022). ? ? ? ?I discussed the assessment and treatment plan with the patient. The patient was provided an opportunity to ask questions and all were answered. The patient agreed with the plan and demonstrated an understanding of the instructions. ?  ?The patient was advised to call back or seek an in-person evaluation if the symptoms worsen or if the condition fails to improve as anticipated. ? ?I provided 40 minutes of face-to-face time during this encounter. ? ? ?Micheline Rough, MD  ? ? ?

## 2022-02-08 ENCOUNTER — Telehealth: Payer: Self-pay | Admitting: *Deleted

## 2022-02-08 NOTE — Telephone Encounter (Signed)
Spoke with the patient and attempted to schedule a visit as below.  Patient stated she will check her schedule and call back for an appt. ?

## 2022-02-08 NOTE — Telephone Encounter (Signed)
-----   Message from Caren Macadam, MD sent at 02/05/2022 12:28 PM EDT ----- ?Please set up virtual followup for patient next month. Ok to use same day (I think she prefers 24th) ?

## 2022-02-09 NOTE — Telephone Encounter (Signed)
Scheduled 03/10/22 at 130p ?

## 2022-02-16 NOTE — Telephone Encounter (Signed)
Sent pt a MyChart message I regards to this matter. See chart.  ?

## 2022-03-10 ENCOUNTER — Ambulatory Visit (INDEPENDENT_AMBULATORY_CARE_PROVIDER_SITE_OTHER): Payer: Managed Care, Other (non HMO) | Admitting: Family Medicine

## 2022-03-10 ENCOUNTER — Encounter: Payer: Self-pay | Admitting: Family Medicine

## 2022-03-10 VITALS — BP 120/88 | HR 79 | Temp 98.2°F | Ht 61.0 in | Wt 150.1 lb

## 2022-03-10 DIAGNOSIS — E1165 Type 2 diabetes mellitus with hyperglycemia: Secondary | ICD-10-CM

## 2022-03-10 DIAGNOSIS — I10 Essential (primary) hypertension: Secondary | ICD-10-CM

## 2022-03-10 LAB — HEMOGLOBIN A1C: Hgb A1c MFr Bld: 6.1 % (ref 4.6–6.5)

## 2022-03-10 NOTE — Progress Notes (Signed)
Rebecca Paul DOB: [birth year] Encounter date: 03/10/2022  This is a 66 y.o. female who presents with Chief Complaint  Patient presents with   Follow-up    History of present illness: She has had parathyroidectomy since last visit. She will follow up with endocrinology in 6 months, Dr. Claiborne Billings. They will follow bone density testing as well.   Hctz had been stopped prior to parathyroid surgery and endo does not want her back on that due to regulation of calcium levels. She forgot her blood pressure log today, but states that bp has changed overall. From recall: 136/79 (this was better of 2). She was seeing numbers in the 140's at home and even 150's. Hasn't checked bp in this last week. She is recalling now that she didn't take her bp medication today.  Has been doing well with blood sugar management she thinks. In the mornings if fasting she is getting low 100's and then 2 hours post prandial getting highest 140-150. Then in evening running 110.   Had a couple of headaches prior to surgery. She is sleeping better through night and fatigue is better.    Allergies  Allergen Reactions   Dust Mite Extract Other (See Comments)    Congestion   Current Meds  Medication Sig   amLODipine (NORVASC) 5 MG tablet TAKE 1 TABLET BY MOUTH EVERY DAY   Azelastine-Fluticasone 137-50 MCG/ACT SUSP Place 1 spray into the nose every 12 (twelve) hours.   blood glucose meter kit and supplies KIT Dispense based on patient and insurance preference. Use up to four times daily as directed. (FOR ICD-9 250.00, 250.01).   blood glucose meter kit and supplies Use as directed once a day   Blood Glucose Monitoring Suppl (CONTOUR BLOOD GLUCOSE SYSTEM) w/Device KIT by Does not apply route.   Cholecalciferol (VITAMIN D) 50 MCG (2000 UT) tablet 1 tablet   co-enzyme Q-10 30 MG capsule Take 30 mg by mouth 3 (three) times daily.   Continuous Blood Gluc Sensor (FREESTYLE LIBRE 2 SENSOR) MISC 1 DEVICE BY DOES NOT APPLY ROUTE  EVERY 14 (FOURTEEN) DAYS.   CONTOUR NEXT TEST test strip USE AS INSTRUCTED   famciclovir (FAMVIR) 250 MG tablet Take 1 tablet (250 mg total) by mouth 2 (two) times daily as needed.   losartan (COZAAR) 100 MG tablet Take 1 tablet (100 mg total) by mouth daily.   metFORMIN (GLUCOPHAGE) 500 MG tablet Take 1 tablet (500 mg total) by mouth 2 (two) times daily with a meal. Start with 1 tablet daily and increase to twice daily as tolerated.   OVER THE COUNTER MEDICATION Calcium with vitamin D daily   rosuvastatin (CRESTOR) 20 MG tablet Take 1 tablet (20 mg total) by mouth daily.    Review of Systems  Constitutional:  Negative for chills, fatigue and fever.  Respiratory:  Negative for cough, chest tightness, shortness of breath and wheezing.   Cardiovascular:  Negative for chest pain, palpitations and leg swelling.   Objective:  BP 120/88 (BP Location: Left Arm, Patient Position: Sitting, Cuff Size: Normal)   Pulse 79   Temp 98.2 F (36.8 C) (Oral)   Ht _0  (1.549 m)   Wt 150 lb 1.6 oz (68.1 kg)   SpO2 96%   BMI 28.36 kg/m   Weight: 150 lb 1.6 oz (68.1 kg)   BP Readings from Last 3 Encounters:  03/10/22 120/88  11/25/21 140/70  10/16/21 110/68   Wt Readings from Last 3 Encounters:  03/10/22 150 lb 1.6 oz (  68.1 kg)  11/25/21 154 lb 9.6 oz (70.1 kg)  10/16/21 150 lb 12.8 oz (68.4 kg)    Physical Exam Constitutional:      General: She is not in acute distress.    Appearance: She is well-developed.  Cardiovascular:     Rate and Rhythm: Normal rate and regular rhythm.     Heart sounds: Normal heart sounds. No murmur heard.   No friction rub.  Pulmonary:     Effort: Pulmonary effort is normal. No respiratory distress.     Breath sounds: Normal breath sounds. No wheezing or rales.  Musculoskeletal:     Right lower leg: No edema.     Left lower leg: No edema.  Neurological:     Mental Status: She is alert and oriented to person, place, and time.  Psychiatric:        Behavior:  Behavior normal.    Assessment/Plan  1. Controlled type 2 diabetes mellitus with hyperglycemia, without long-term current use of insulin ([place]) Has been working on diet control, doing well with metformin.  Plans to continue to work on healthy eating and regular activity level.  She is happy to have the stress of surgery behind her, and feels motivated to keep up with healthy lifestyle.  2. Essential hypertension Discussed blood pressure goals.  Discussed that if pressures at home are regularly above 125/75, she can add a 2.5 mg amlodipine to her daily dose.  As her stress levels improve when she focuses on health, blood pressure may improve without med adjustment.  She is committed to continue to check at home.  Pressures have improved since our last visit.   Return for pending lab or imaging results.     Micheline Rough, MD

## 2022-03-10 NOTE — Patient Instructions (Addendum)
Our goal for blood pressure would be around 110-125/65-75 average. Occasional outliers are normal. Too low if symptomatic and less than 100/65 regularly. If you are regularly above goal, then I would suggest adding 2.5 mg of amlodipine to your daily dose. You could take all at once or you could take a half tab of your '5mg'$  at bedtime.  Look at viactiv chews for your calcium/d supplement.

## 2022-03-10 NOTE — Progress Notes (Signed)
  Rebecca Paul DOB: 08-Feb-1956 Encounter date: 03/10/2022  This is a 66 y.o. female who presents with Chief Complaint  Patient presents with   Follow-up    History of present illness: She has had parathyroidectomy since last visit.   Hctz had been stopped prior to parathyroid surgery and endo does not want her back on that due to regulation of calcium levels. She forgot her blood pressure log today, but states that bp has changed overall. From recall: 136/79 (this was better of 2).     HPI   Allergies  Allergen Reactions   Dust Mite Extract Other (See Comments)    Congestion   Current Meds  Medication Sig   amLODipine (NORVASC) 5 MG tablet TAKE 1 TABLET BY MOUTH EVERY DAY   Azelastine-Fluticasone 137-50 MCG/ACT SUSP Place 1 spray into the nose every 12 (twelve) hours.   blood glucose meter kit and supplies KIT Dispense based on patient and insurance preference. Use up to four times daily as directed. (FOR ICD-9 250.00, 250.01).   blood glucose meter kit and supplies Use as directed once a day   Blood Glucose Monitoring Suppl (CONTOUR BLOOD GLUCOSE SYSTEM) w/Device KIT by Does not apply route.   Cholecalciferol (VITAMIN D) 50 MCG (2000 UT) tablet 1 tablet   co-enzyme Q-10 30 MG capsule Take 30 mg by mouth 3 (three) times daily.   Continuous Blood Gluc Sensor (FREESTYLE LIBRE 2 SENSOR) MISC 1 DEVICE BY DOES NOT APPLY ROUTE EVERY 14 (FOURTEEN) DAYS.   CONTOUR NEXT TEST test strip USE AS INSTRUCTED   famciclovir (FAMVIR) 250 MG tablet Take 1 tablet (250 mg total) by mouth 2 (two) times daily as needed.   losartan (COZAAR) 100 MG tablet Take 1 tablet (100 mg total) by mouth daily.   metFORMIN (GLUCOPHAGE) 500 MG tablet Take 1 tablet (500 mg total) by mouth 2 (two) times daily with a meal. Start with 1 tablet daily and increase to twice daily as tolerated.   OVER THE COUNTER MEDICATION Calcium with vitamin D daily   rosuvastatin (CRESTOR) 20 MG tablet Take 1 tablet (20 mg total) by  mouth daily.    Review of Systems  Objective:  BP 120/88 (BP Location: Left Arm, Patient Position: Sitting, Cuff Size: Normal)   Pulse 79   Temp 98.2 F (36.8 C) (Oral)   Ht _0  (1.549 m)   Wt 150 lb 1.6 oz (68.1 kg)   SpO2 96%   BMI 28.36 kg/m   Weight: 150 lb 1.6 oz (68.1 kg)   BP Readings from Last 3 Encounters:  03/10/22 120/88  11/25/21 140/70  10/16/21 110/68   Wt Readings from Last 3 Encounters:  03/10/22 150 lb 1.6 oz (68.1 kg)  11/25/21 154 lb 9.6 oz (70.1 kg)  10/16/21 150 lb 12.8 oz (68.4 kg)    Physical Exam  Assessment/Plan  There are no diagnoses linked to this encounter.       Micheline Rough, MD

## 2022-04-28 ENCOUNTER — Telehealth: Payer: Self-pay | Admitting: Family Medicine

## 2022-04-28 DIAGNOSIS — E1165 Type 2 diabetes mellitus with hyperglycemia: Secondary | ICD-10-CM

## 2022-04-28 MED ORDER — METFORMIN HCL 500 MG PO TABS: 500.0000 mg | ORAL_TABLET | Freq: Two times a day (BID) | ORAL | 0 refills | Status: DC

## 2022-04-28 NOTE — Telephone Encounter (Signed)
Pt reqeusting refill of metFORMIN (GLUCOPHAGE) 500 MG tablet to be sent to  [facility], [place] [address] Phone:  [phone]  Fax:  [phone]    Pt has a week's worth of meds left. Pls call with update

## 2022-05-03 NOTE — Telephone Encounter (Signed)
Left a detailed message informing the pt that rx was previously sent on 04/28/2022 and has been received by pharmacy.

## 2022-05-03 NOTE — Telephone Encounter (Signed)
Pt called stating she still has not received her refill for the Metformin that she called in last week. Pt was very upset and stated she does not understand what the problem is.   Pt is going out of town and needs her medication ASAP!!    Pt requesting refill of metFORMIN (GLUCOPHAGE) 500 MG tablet   Please send to:  [facility], [address]

## 2022-05-13 ENCOUNTER — Ambulatory Visit (INDEPENDENT_AMBULATORY_CARE_PROVIDER_SITE_OTHER): Payer: Medicare Other | Admitting: Family Medicine

## 2022-05-13 ENCOUNTER — Encounter: Payer: Self-pay | Admitting: Family Medicine

## 2022-05-13 VITALS — BP 130/80 | HR 75 | Temp 97.9°F | Wt 151.9 lb

## 2022-05-13 DIAGNOSIS — E782 Mixed hyperlipidemia: Secondary | ICD-10-CM

## 2022-05-13 DIAGNOSIS — I10 Essential (primary) hypertension: Secondary | ICD-10-CM | POA: Diagnosis not present

## 2022-05-13 DIAGNOSIS — E213 Hyperparathyroidism, unspecified: Secondary | ICD-10-CM | POA: Diagnosis not present

## 2022-05-13 DIAGNOSIS — E119 Type 2 diabetes mellitus without complications: Secondary | ICD-10-CM

## 2022-05-13 NOTE — Progress Notes (Signed)
Established Patient Office Visit  Subjective   Patient ID: Rebecca Paul, female    DOB: [birth year]  Age: 66 y.o. MRN: [id number]  Chief Complaint  Patient presents with   Establish Care   Diabetes   Hypertension   Hyperlipidemia    Patient is here for follow up. States she recently had her parathyroid surgery, states she had the surgery at [facility]. States that she is seeing the endocrinologist here and she was told she is doing well.    DM - has been taking her metformin as prescribed, states that she was initially trying to control it with her diet     Diabetes She presents for her follow-up diabetic visit. She has type 2 diabetes mellitus. Her disease course has been stable. There are no hypoglycemic associated symptoms. Pertinent negatives for hypoglycemia include no sweats. There are no diabetic associated symptoms. Symptoms are stable. Her weight is stable. She does not see a podiatrist.Eye exam is current.  Hypertension This is a chronic problem. The problem is unchanged. The problem is controlled. Pertinent negatives include no shortness of breath or sweats. There are no associated agents to hypertension. Risk factors for coronary artery disease include diabetes mellitus and dyslipidemia. Past treatments include calcium channel blockers and angiotensin blockers. There are no compliance problems.  There is no history of kidney disease or heart failure.  Hyperlipidemia This is a chronic problem. The current episode started more than 1 year ago. Pertinent negatives include no shortness of breath.   Outpatient Encounter Medications as of 05/13/2022  Medication Sig   amLODipine (NORVASC) 5 MG tablet TAKE 1 TABLET BY MOUTH EVERY DAY   Azelastine-Fluticasone 137-50 MCG/ACT SUSP Place 1 spray into the nose every 12 (twelve) hours.   blood glucose meter kit and supplies KIT Dispense based on patient and insurance preference. Use up to four times daily as directed. (FOR ICD-9  250.00, 250.01).   blood glucose meter kit and supplies Use as directed once a day   Blood Glucose Monitoring Suppl (CONTOUR BLOOD GLUCOSE SYSTEM) w/Device KIT by Does not apply route.   Cholecalciferol (VITAMIN D) 50 MCG (2000 UT) tablet 1 tablet   co-enzyme Q-10 30 MG capsule Take 30 mg by mouth 3 (three) times daily.   Continuous Blood Gluc Sensor (FREESTYLE LIBRE 2 SENSOR) MISC 1 DEVICE BY DOES NOT APPLY ROUTE EVERY 14 (FOURTEEN) DAYS.   CONTOUR NEXT TEST test strip USE AS INSTRUCTED   famciclovir (FAMVIR) 250 MG tablet Take 1 tablet (250 mg total) by mouth 2 (two) times daily as needed.   losartan (COZAAR) 100 MG tablet Take 1 tablet (100 mg total) by mouth daily.   metFORMIN (GLUCOPHAGE) 500 MG tablet Take 1 tablet (500 mg total) by mouth 2 (two) times daily with a meal. Start with 1 tablet daily and increase to twice daily as tolerated.   OVER THE COUNTER MEDICATION Calcium with vitamin D daily   rosuvastatin (CRESTOR) 20 MG tablet Take 1 tablet (20 mg total) by mouth daily.   No facility-administered encounter medications on file as of 05/13/2022.    Patient Active Problem List   Diagnosis Date Noted   Mild CAD 11/25/2021   Hyperlipidemia 11/25/2021   Preoperative cardiovascular examination 11/25/2021   Osteopenia 08/25/2020   Vitamin D deficiency 11/20/2019   Hyperparathyroidism ([place]) 11/20/2019   Hypercalcemia 11/20/2019   DM II (diabetes mellitus, type II), controlled ([place]) 04/09/2019   BACK PAIN 07/06/2010   Essential hypertension 04/22/2009   Dyslipidemia 04/02/2008  NEPHROLITHIASIS 10/30/2007      Review of Systems  Respiratory:  Negative for shortness of breath.   All other systems reviewed and are negative.     Objective:     BP 130/80 (BP Location: Right Arm, Patient Position: Sitting, Cuff Size: Normal)   Pulse 75   Temp 97.9 F (36.6 C) (Oral)   Wt 151 lb 14.4 oz (68.9 kg)   SpO2 98%   BMI 28.70 kg/m    Physical Exam Vitals reviewed.   Constitutional:      Appearance: Normal appearance. She is well-groomed and normal weight.  HENT:     Head: Normocephalic and atraumatic.  Eyes:     Extraocular Movements: Extraocular movements intact.     Conjunctiva/sclera: Conjunctivae normal.     Pupils: Pupils are equal, round, and reactive to light.  Cardiovascular:     Rate and Rhythm: Normal rate and regular rhythm.     Pulses: Normal pulses.     Heart sounds: S1 normal and S2 normal.  Pulmonary:     Effort: Pulmonary effort is normal.     Breath sounds: Normal breath sounds and air entry.  Abdominal:     General: Abdomen is flat. Bowel sounds are normal.     Palpations: Abdomen is soft.  Musculoskeletal:        General: Normal range of motion.     Cervical back: Normal range of motion and neck supple.     Right lower leg: No edema.     Left lower leg: No edema.  Skin:    General: Skin is warm and dry.  Neurological:     Mental Status: She is alert and oriented to person, place, and time. Mental status is at baseline.     Gait: Gait is intact.  Psychiatric:        Mood and Affect: Mood and affect normal.        Speech: Speech normal.        Behavior: Behavior normal.      No results found for any visits on 05/13/22.  Last metabolic panel Lab Results  Component Value Date   GLUCOSE 100 (H) 01/01/2022   NA 138 01/01/2022   K 4.1 01/01/2022   CL 105 01/01/2022   CO2 28 01/01/2022   BUN 13 01/01/2022   CREATININE 0.78 01/01/2022   EGFR 86 09/28/2021   CALCIUM 11.0 (H) 01/01/2022   PROT 8.0 01/01/2022   ALBUMIN 4.4 01/01/2022   BILITOT 0.5 01/01/2022   ALKPHOS 50 01/01/2022   AST 17 01/01/2022   ALT 13 01/01/2022   Last hemoglobin A1c Lab Results  Component Value Date   HGBA1C 6.1 03/10/2022      The 10-year ASCVD risk score (Arnett DK, et al., 2019) is: 20.1%    Assessment & Plan:   Problem List Items Addressed This Visit       Cardiovascular and Mediastinum   Essential hypertension     BP is well controlled today in office, pt had reported that it had increased at home after her surgery but it is coming down now. Continue norvasc 5 mg and cozaar 100 mg. Continue to monitor at each visit.        Endocrine   DM II (diabetes mellitus, type II), controlled ([place]) - Primary    A1C today is well controlled, continue metformin 500 mg. She is UTD on her foot and eye exams.      Relevant Orders   Hemoglobin A1c  Hyperparathyroidism [facility])    S/p parathyroidectomy in May 2023. She continues to follow with her endocrinologist. She needs a new BMP today to check her calcium level.      Relevant Orders   Basic Metabolic Panel     Other   Hyperlipidemia    Continue crestor - states she has not increased her dose to the 20 mg yet, wants to see what her cholesterol is today before increasing her dose to 20 mg, I have ordered her a new lipid panel today for surveillance and to see if she needs to increase the dose of her crestor.       Relevant Orders   Lipid Panel    Return in about 6 months (around 11/13/2022) for preventative health visit or initial medicare visit (not sure which one).    Farrel Conners, MD

## 2022-05-14 NOTE — Assessment & Plan Note (Signed)
BP is well controlled today in office, pt had reported that it had increased at home after her surgery but it is coming down now. Continue norvasc 5 mg and cozaar 100 mg. Continue to monitor at each visit.

## 2022-05-14 NOTE — Assessment & Plan Note (Signed)
S/p parathyroidectomy in May 2023. She continues to follow with her endocrinologist. She needs a new BMP today to check her calcium level.

## 2022-05-14 NOTE — Assessment & Plan Note (Signed)
A1C today is well controlled, continue metformin 500 mg. She is UTD on her foot and eye exams.

## 2022-05-14 NOTE — Assessment & Plan Note (Signed)
Continue crestor - states she has not increased her dose to the 20 mg yet, wants to see what her cholesterol is today before increasing her dose to 20 mg, I have ordered her a new lipid panel today for surveillance and to see if she needs to increase the dose of her crestor.

## 2022-05-24 ENCOUNTER — Telehealth: Payer: Self-pay | Admitting: Family Medicine

## 2022-05-24 NOTE — Telephone Encounter (Addendum)
Pt called saying that she called her insurance (BC/BS [employer]) and was told Dr. Legrand Como was not in network  She would like confirmation.  She is scheduled for a CPE with Dr. Jerilynn Mages on  09/20/22  She wants to speak to a supervisor.

## 2022-05-26 ENCOUNTER — Other Ambulatory Visit: Payer: Managed Care, Other (non HMO)

## 2022-06-03 DIAGNOSIS — M5416 Radiculopathy, lumbar region: Secondary | ICD-10-CM | POA: Diagnosis not present

## 2022-06-03 DIAGNOSIS — M5451 Vertebrogenic low back pain: Secondary | ICD-10-CM | POA: Diagnosis not present

## 2022-06-08 ENCOUNTER — Other Ambulatory Visit (INDEPENDENT_AMBULATORY_CARE_PROVIDER_SITE_OTHER): Payer: Medicare Other

## 2022-06-08 DIAGNOSIS — E119 Type 2 diabetes mellitus without complications: Secondary | ICD-10-CM

## 2022-06-08 DIAGNOSIS — E213 Hyperparathyroidism, unspecified: Secondary | ICD-10-CM | POA: Diagnosis not present

## 2022-06-08 DIAGNOSIS — E782 Mixed hyperlipidemia: Secondary | ICD-10-CM | POA: Diagnosis not present

## 2022-06-08 LAB — LIPID PANEL
Cholesterol: 175 mg/dL (ref 0–200)
HDL: 55.1 mg/dL (ref >39.00)
LDL Cholesterol: 100 mg/dL — ABNORMAL HIGH (ref 0–99)
NonHDL: 119.97
Total CHOL/HDL Ratio: 3
Triglycerides: 101 mg/dL (ref 0.0–149.0)
VLDL: 20.2 mg/dL (ref 0.0–40.0)

## 2022-06-08 LAB — BASIC METABOLIC PANEL
BUN: 14 mg/dL (ref 6–23)
CO2: 27 mEq/L (ref 19–32)
Calcium: 9.2 mg/dL (ref 8.4–10.5)
Chloride: 102 mEq/L (ref 96–112)
Creatinine, Ser: 0.79 mg/dL (ref 0.40–1.20)
GFR: 78.65 mL/min (ref >60.00)
Glucose, Bld: 97 mg/dL (ref 70–99)
Potassium: 3.7 mEq/L (ref 3.5–5.1)
Sodium: 136 mEq/L (ref 135–145)

## 2022-06-08 LAB — HEMOGLOBIN A1C: Hgb A1c MFr Bld: 6.4 % (ref 4.6–6.5)

## 2022-06-09 DIAGNOSIS — M5451 Vertebrogenic low back pain: Secondary | ICD-10-CM | POA: Diagnosis not present

## 2022-06-09 DIAGNOSIS — M5416 Radiculopathy, lumbar region: Secondary | ICD-10-CM | POA: Diagnosis not present

## 2022-06-23 DIAGNOSIS — M5451 Vertebrogenic low back pain: Secondary | ICD-10-CM | POA: Diagnosis not present

## 2022-06-23 DIAGNOSIS — M5416 Radiculopathy, lumbar region: Secondary | ICD-10-CM | POA: Diagnosis not present

## 2022-06-24 NOTE — Telephone Encounter (Signed)
Patient would like to follow up on previous conversation regarding Dr.Michael's submission into [employer]. Patient would like a call back from [place], as she is concerned about her upcoming appointments in office.     Please advise

## 2022-06-25 ENCOUNTER — Ambulatory Visit (INDEPENDENT_AMBULATORY_CARE_PROVIDER_SITE_OTHER): Payer: Medicare Other

## 2022-06-25 DIAGNOSIS — Z23 Encounter for immunization: Secondary | ICD-10-CM | POA: Diagnosis not present

## 2022-07-19 DIAGNOSIS — M8588 Other specified disorders of bone density and structure, other site: Secondary | ICD-10-CM | POA: Diagnosis not present

## 2022-07-19 DIAGNOSIS — Z01419 Encounter for gynecological examination (general) (routine) without abnormal findings: Secondary | ICD-10-CM | POA: Diagnosis not present

## 2022-07-19 DIAGNOSIS — Z1231 Encounter for screening mammogram for malignant neoplasm of breast: Secondary | ICD-10-CM | POA: Diagnosis not present

## 2022-07-19 DIAGNOSIS — Z1272 Encounter for screening for malignant neoplasm of vagina: Secondary | ICD-10-CM | POA: Diagnosis not present

## 2022-07-19 DIAGNOSIS — Z6829 Body mass index (BMI) 29.0-29.9, adult: Secondary | ICD-10-CM | POA: Diagnosis not present

## 2022-07-19 LAB — HM MAMMOGRAPHY

## 2022-07-20 LAB — HM PAP SMEAR

## 2022-07-21 ENCOUNTER — Telehealth: Payer: Self-pay | Admitting: Family Medicine

## 2022-07-21 MED ORDER — LOSARTAN POTASSIUM 100 MG PO TABS: 100.0000 mg | ORAL_TABLET | Freq: Every day | ORAL | 0 refills | Status: DC

## 2022-07-21 NOTE — Telephone Encounter (Signed)
Rx done. 

## 2022-07-21 NOTE — Telephone Encounter (Signed)
Ok to send refill  

## 2022-07-21 NOTE — Telephone Encounter (Signed)
Pt was last seen on July 2023 and needs a new rx losartan (COZAAR) 100 MG tablet #90 w/refills  to her new [place], [place] - [address] [address] Phone:  [phone]  Fax:  [phone]

## 2022-07-27 ENCOUNTER — Other Ambulatory Visit: Payer: Self-pay | Admitting: *Deleted

## 2022-07-27 MED ORDER — AMLODIPINE BESYLATE 5 MG PO TABS: 5.0000 mg | ORAL_TABLET | Freq: Every day | ORAL | 0 refills | Status: DC

## 2022-08-16 DIAGNOSIS — H5213 Myopia, bilateral: Secondary | ICD-10-CM | POA: Diagnosis not present

## 2022-08-17 ENCOUNTER — Other Ambulatory Visit: Payer: Self-pay | Admitting: Cardiology

## 2022-08-18 ENCOUNTER — Other Ambulatory Visit: Payer: Self-pay | Admitting: *Deleted

## 2022-08-18 DIAGNOSIS — E1165 Type 2 diabetes mellitus with hyperglycemia: Secondary | ICD-10-CM

## 2022-08-19 MED ORDER — METFORMIN HCL 500 MG PO TABS: 500.0000 mg | ORAL_TABLET | Freq: Two times a day (BID) | ORAL | 0 refills | Status: DC

## 2022-08-23 ENCOUNTER — Telehealth: Payer: Self-pay | Admitting: *Deleted

## 2022-08-23 DIAGNOSIS — E785 Hyperlipidemia, unspecified: Secondary | ICD-10-CM

## 2022-08-23 MED ORDER — ROSUVASTATIN CALCIUM 20 MG PO TABS: 20.0000 mg | ORAL_TABLET | Freq: Every day | ORAL | 1 refills | Status: DC

## 2022-08-26 NOTE — Telephone Encounter (Signed)
Pt called to say she was sent a 20 mg prescription and should have been sent a 10 mg prescription of the   rosuvastatin (CRESTOR) 20 MG tablet   Please advise.  [place], [place] - [address] Phone: [phone]  Fax: [phone]

## 2022-08-26 NOTE — Telephone Encounter (Signed)
Spoke with Nira Conn at [facility] and she stated the patient has not picked up the Rx.  Message sent to PCP.

## 2022-08-26 NOTE — Telephone Encounter (Signed)
Did she already pick it up? If so then she can cut the tablets in half and just take 1/2. If not then I can call in a new rx

## 2022-08-27 MED ORDER — ROSUVASTATIN CALCIUM 10 MG PO TABS: 10.0000 mg | ORAL_TABLET | Freq: Every day | ORAL | 3 refills | Status: DC

## 2022-08-27 NOTE — Addendum Note (Signed)
Addended by: Farrel Conners on: 08/27/2022 07:58 AM   Modules accepted: Orders

## 2022-08-27 NOTE — Telephone Encounter (Signed)
Noted  

## 2022-08-30 DIAGNOSIS — H524 Presbyopia: Secondary | ICD-10-CM | POA: Diagnosis not present

## 2022-09-20 ENCOUNTER — Ambulatory Visit (INDEPENDENT_AMBULATORY_CARE_PROVIDER_SITE_OTHER): Payer: Medicare Other | Admitting: Family Medicine

## 2022-09-20 ENCOUNTER — Encounter: Payer: Self-pay | Admitting: Family Medicine

## 2022-09-20 VITALS — BP 150/80 | HR 71 | Temp 97.9°F | Ht 61.0 in | Wt 149.9 lb

## 2022-09-20 DIAGNOSIS — Z23 Encounter for immunization: Secondary | ICD-10-CM | POA: Diagnosis not present

## 2022-09-20 DIAGNOSIS — E119 Type 2 diabetes mellitus without complications: Secondary | ICD-10-CM

## 2022-09-20 DIAGNOSIS — E785 Hyperlipidemia, unspecified: Secondary | ICD-10-CM | POA: Diagnosis not present

## 2022-09-20 LAB — POCT GLYCOSYLATED HEMOGLOBIN (HGB A1C): Hemoglobin A1C: 5.8 % — AB (ref 4.0–5.6)

## 2022-09-20 NOTE — Patient Instructions (Signed)
Debrox ear drops 2-3 times a week to help soften ear wax.

## 2022-09-20 NOTE — Assessment & Plan Note (Signed)
A1C much improved today, will continue her metformin 500 mg BID as prescribed. Will see her back in March 2024 for her annual visit.

## 2022-09-20 NOTE — Progress Notes (Signed)
Established Patient Office Visit  Subjective   Patient ID: Rebecca Paul, female    DOB: 1956/04/18  Age: 66 y.o. MRN: [id number]  No chief complaint on file.   Patient is here for follow up on her A1C. She reports no changes in diet or exercise since her last visit. A1C today is much improved, down to 5.8.   Pt is reporting occasional swelling in her legs, states this happens mostly when it is hot and when she is on a plane ride. Otherwise her feet do not swell on a regular basis. I explained that it is probably her BP medication amlodipine causing this side effect. BP today was elevated in office however pt states she checked it at home and it was in the 130's.   I reviewed her lipid panel with the patient today. She had questions about her blood work. We discussed her cholesterol medication and her CT.  Pt is reporting increased right ear itching and feels like it is clogged with wax. She reports that she feels like there is a cold coming on, post nasal drip and some scratchy throat   Pt had mammogram in October and also had her pap smear. Also had her bone density done and results with her today.    Current Outpatient Medications  Medication Instructions   amLODipine (NORVASC) 5 mg, Oral, Daily   Azelastine-Fluticasone 137-50 MCG/ACT SUSP 1 spray, Nasal, Every 12 hours   blood glucose meter kit and supplies KIT Dispense based on patient and insurance preference. Use up to four times daily as directed. (FOR ICD-9 250.00, 250.01).   blood glucose meter kit and supplies Use as directed once a day   Blood Glucose Monitoring Suppl (CONTOUR BLOOD GLUCOSE SYSTEM) w/Device KIT Does not apply   Cholecalciferol (VITAMIN D) 50 MCG (2000 UT) tablet 1 tablet   co-enzyme Q-10 30 mg, Oral, 3 times daily   Continuous Blood Gluc Sensor (FREESTYLE LIBRE 2 SENSOR) MISC 1 DEVICE BY DOES NOT APPLY ROUTE EVERY 14 (FOURTEEN) DAYS.   CONTOUR NEXT TEST test strip USE AS INSTRUCTED   famciclovir (FAMVIR)  250 mg, Oral, 2 times daily PRN   losartan (COZAAR) 100 mg, Oral, Daily   metFORMIN (GLUCOPHAGE) 500 mg, Oral, 2 times daily with meals, Start with 1 tablet daily and increase to twice daily as tolerated.   OVER THE COUNTER MEDICATION Calcium with vitamin D daily   rosuvastatin (CRESTOR) 10 mg, Oral, Daily    Patient Active Problem List   Diagnosis Date Noted   Mild CAD 11/25/2021   Hyperlipidemia 11/25/2021   Preoperative cardiovascular examination 11/25/2021   Osteopenia 08/25/2020   Vitamin D deficiency 11/20/2019   Hyperparathyroidism ([place]) 11/20/2019   Hypercalcemia 11/20/2019   DM II (diabetes mellitus, type II), controlled ([place]) 04/09/2019   BACK PAIN 07/06/2010   Essential hypertension 04/22/2009   Dyslipidemia 04/02/2008   NEPHROLITHIASIS 10/30/2007      Review of Systems  All other systems reviewed and are negative.     Objective:     BP (!) 150/80 (BP Location: Left Arm, Patient Position: Sitting, Cuff Size: Normal)   Pulse 71   Temp 97.9 F (36.6 C) (Oral)   Ht _0  (1.549 m)   Wt 149 lb 14.4 oz (68 kg)   SpO2 99%   BMI 28.32 kg/m  BP Readings from Last 3 Encounters:  09/20/22 (!) 150/80  05/13/22 130/80  03/10/22 120/88      Physical Exam Vitals reviewed.  Constitutional:  Appearance: Normal appearance. She is well-groomed and normal weight.  HENT:     Right Ear: Tympanic membrane normal.     Left Ear: Tympanic membrane normal.     Ears:     Comments: Slight wax in the right EAC but TM was easily visible. Eyes:     Conjunctiva/sclera: Conjunctivae normal.  Neck:     Thyroid: No thyromegaly.  Cardiovascular:     Rate and Rhythm: Normal rate and regular rhythm.     Pulses: Normal pulses.     Heart sounds: S1 normal and S2 normal.  Pulmonary:     Effort: Pulmonary effort is normal.     Breath sounds: Normal breath sounds and air entry.  Musculoskeletal:     Right lower leg: No edema.     Left lower leg: No edema.  Lymphadenopathy:      Cervical: No cervical adenopathy.  Neurological:     Mental Status: She is alert and oriented to person, place, and time. Mental status is at baseline.     Gait: Gait is intact.  Psychiatric:        Mood and Affect: Mood and affect normal.        Speech: Speech normal.        Behavior: Behavior normal.        Judgment: Judgment normal.      Results for orders placed or performed in visit on 09/20/22  POC HgB A1c  Result Value Ref Range   Hemoglobin A1C 5.8 (A) 4.0 - 5.6 %   HbA1c POC (<> result, manual entry)     HbA1c, POC (prediabetic range)     HbA1c, POC (controlled diabetic range)      Last lipids Lab Results  Component Value Date   CHOL 175 06/08/2022   HDL 55.10 06/08/2022   LDLCALC 100 (H) 06/08/2022   LDLDIRECT 140.3 12/04/2012   TRIG 101.0 06/08/2022   CHOLHDL 3 06/08/2022      The 10-year ASCVD risk score (Arnett DK, et al., 2019) is: 25.8%    Assessment & Plan:   Problem List Items Addressed This Visit       Unprioritized   Dyslipidemia    I reviewed her Lipid panel with her today, answered questions, I recommend we continue the current dose of rosuvastatin since her LDL is at 100. Will recheck labs in March at her regular visit.      DM II (diabetes mellitus, type II), controlled ([place]) - Primary    A1C much improved today, will continue her metformin 500 mg BID as prescribed. Will see her back in March 2024 for her annual visit.       Relevant Orders   POC HgB A1c (Completed)   Other Visit Diagnoses     Immunization due       Relevant Orders   Pneumococcal conjugate vaccine 20-valent (Prevnar 20) (Completed)   Need for pneumococcal 20-valent conjugate vaccination           Return in about 15 weeks (around 01/03/2023) for follow up.    Farrel Conners, MD

## 2022-09-20 NOTE — Assessment & Plan Note (Signed)
I reviewed her Lipid panel with her today, answered questions, I recommend we continue the current dose of rosuvastatin since her LDL is at 100. Will recheck labs in March at her regular visit.

## 2022-09-24 ENCOUNTER — Telehealth: Payer: Self-pay | Admitting: Family Medicine

## 2022-09-24 DIAGNOSIS — R051 Acute cough: Secondary | ICD-10-CM

## 2022-09-24 MED ORDER — BENZONATATE 100 MG PO CAPS: 100.0000 mg | ORAL_CAPSULE | Freq: Two times a day (BID) | ORAL | 0 refills | Status: DC | PRN

## 2022-09-24 NOTE — Telephone Encounter (Signed)
Pt seen dr Legrand Como on 09-20-2022 and she has a cough and per pt dr Legrand Como is aware and the cough is no better and pt has tried OTC and would like rx sent to  [place], [place] [address] Phone: [phone]  Fax: [phone]

## 2022-09-24 NOTE — Telephone Encounter (Signed)
Left detailed message informing pt that rx was sent

## 2022-09-28 DIAGNOSIS — E213 Hyperparathyroidism, unspecified: Secondary | ICD-10-CM | POA: Diagnosis not present

## 2022-10-18 ENCOUNTER — Other Ambulatory Visit: Payer: Self-pay | Admitting: Family Medicine

## 2022-11-09 ENCOUNTER — Other Ambulatory Visit: Payer: Self-pay | Admitting: Family Medicine

## 2022-11-11 ENCOUNTER — Telehealth: Payer: Self-pay | Admitting: Family Medicine

## 2022-11-11 DIAGNOSIS — E1165 Type 2 diabetes mellitus with hyperglycemia: Secondary | ICD-10-CM

## 2022-11-11 MED ORDER — FREESTYLE LIBRE 2 SENSOR MISC: 11 refills | Status: DC

## 2022-11-11 NOTE — Telephone Encounter (Signed)
Ok to refill 1 year supply

## 2022-11-11 NOTE — Telephone Encounter (Signed)
Prescription Request  11/11/2022  Is this a "Controlled Substance" medicine? No  LOV: 09/20/2022  What is the name of the medication or equipment? Continuous Blood Gluc Sensor (FREESTYLE LIBRE 2 SENSOR) MISC   Have you contacted your pharmacy to request a refill? No   Which pharmacy would you like this sent to?  [place], [place] - [address] [place] [address] Phone: [phone] Fax: [phone]    Patient notified that their request is being sent to the clinical staff for review and that they should receive a response within 2 business days.   Please advise at Mobile [phone] (mobile)

## 2022-11-11 NOTE — Telephone Encounter (Signed)
Rx done. 

## 2022-11-30 ENCOUNTER — Encounter: Payer: Self-pay | Admitting: Family Medicine

## 2022-11-30 ENCOUNTER — Ambulatory Visit (HOSPITAL_BASED_OUTPATIENT_CLINIC_OR_DEPARTMENT_OTHER)
Admission: RE | Admit: 2022-11-30 | Discharge: 2022-11-30 | Disposition: A | Discharge status: Home / Self Care | Payer: Medicare Other | Source: Ambulatory Visit | Attending: Family Medicine | Admitting: Family Medicine

## 2022-11-30 ENCOUNTER — Ambulatory Visit (INDEPENDENT_AMBULATORY_CARE_PROVIDER_SITE_OTHER): Payer: Medicare Other | Admitting: Family Medicine

## 2022-11-30 VITALS — BP 136/74 | HR 70 | Temp 98.4°F | Ht 61.0 in | Wt 154.8 lb

## 2022-11-30 DIAGNOSIS — M25521 Pain in right elbow: Secondary | ICD-10-CM | POA: Insufficient documentation

## 2022-11-30 NOTE — Progress Notes (Unsigned)
   Acute Office Visit  Subjective:     Patient ID: Rebecca Paul, female    DOB: [birth year], 67 y.o.   MRN: [id number]  Chief Complaint  Patient presents with   Fall    X3 days   Arm Injury    Patient complains of right forearm injury, x3 days     HPI Patient is in today for right arm pain, states she was in a store and she slipped on a rug and fell, broke her fall with her right elbow. Patient states that it is very sore and she just wants to make sure there isn't anything broken.  ROS      Objective:    BP 136/74 (BP Location: Left Arm, Patient Position: Sitting, Cuff Size: Normal)   Pulse 70   Temp 98.4 F (36.9 C) (Oral)   Ht '5\' 1"'$  (1.549 m)   Wt 154 lb 12.8 oz (70.2 kg)   SpO2 97%   BMI 29.25 kg/m  {Vitals History (Optional):23777}  Physical Exam  No results found for any visits on 11/30/22.      Assessment & Plan:   Problem List Items Addressed This Visit   None Visit Diagnoses     Right elbow pain    -  Primary   Relevant Orders   DG Elbow Complete Right       No orders of the defined types were placed in this encounter.   No follow-ups on file.  Farrel Conners, MD

## 2022-12-01 DIAGNOSIS — E213 Hyperparathyroidism, unspecified: Secondary | ICD-10-CM | POA: Diagnosis not present

## 2022-12-07 ENCOUNTER — Telehealth: Payer: Self-pay | Admitting: Family Medicine

## 2022-12-07 MED ORDER — CONTOUR NEXT TEST VI STRP: ORAL_STRIP | 1 refills | Status: DC

## 2022-12-07 MED ORDER — CONTOUR NEXT MONITOR W/DEVICE KIT: PACK | 1 refills | Status: DC

## 2022-12-07 MED ORDER — LANCETS MISC: 1 refills | Status: DC

## 2022-12-07 NOTE — Telephone Encounter (Signed)
Pt is calling and her insurance will now pay for new rx contour next test strip and pt needs new rx for contour meter and lancets. Pt is testing her blood sugar 3 times a day. [place], [place] - [id number] Phone: [phone]  Fax: [phone]

## 2022-12-07 NOTE — Telephone Encounter (Signed)
Rx done. 

## 2022-12-08 MED ORDER — CONTOUR NEXT TEST VI STRP: ORAL_STRIP | 1 refills | Status: DC

## 2022-12-08 MED ORDER — LANCETS MISC: 1 refills | Status: DC

## 2022-12-08 NOTE — Addendum Note (Signed)
Addended by: Agnes Lawrence on: 12/08/2022 04:27 PM   Modules accepted: Orders

## 2022-12-08 NOTE — Telephone Encounter (Signed)
Fax received from [facility] requesting new Rxs  with times per day added.  Rxs were re-sent with use per patient as stated below.

## 2022-12-16 ENCOUNTER — Ambulatory Visit (INDEPENDENT_AMBULATORY_CARE_PROVIDER_SITE_OTHER): Payer: Medicare Other | Admitting: Family Medicine

## 2022-12-16 VITALS — BP 117/68 | Temp 97.8°F | Wt 148.0 lb

## 2022-12-16 DIAGNOSIS — U071 COVID-19: Secondary | ICD-10-CM

## 2022-12-16 DIAGNOSIS — E1165 Type 2 diabetes mellitus with hyperglycemia: Secondary | ICD-10-CM | POA: Diagnosis not present

## 2022-12-16 DIAGNOSIS — J302 Other seasonal allergic rhinitis: Secondary | ICD-10-CM

## 2022-12-16 DIAGNOSIS — R059 Cough, unspecified: Secondary | ICD-10-CM

## 2022-12-16 LAB — POCT INFLUENZA A/B
Influenza A, POC: NEGATIVE
Influenza B, POC: NEGATIVE

## 2022-12-16 LAB — POC COVID19 BINAXNOW: SARS Coronavirus 2 Ag: POSITIVE — AB

## 2022-12-16 MED ORDER — AZELASTINE-FLUTICASONE 137-50 MCG/ACT NA SUSP: 1.0000 | Freq: Two times a day (BID) | NASAL | 5 refills | Status: DC

## 2022-12-16 MED ORDER — FREESTYLE PRECISION NEO TEST VI STRP: 1.0000 | ORAL_STRIP | Freq: Two times a day (BID) | 12 refills | Status: DC

## 2022-12-16 NOTE — Progress Notes (Deleted)
Will this patient be seen outside since she is positive?

## 2022-12-16 NOTE — Progress Notes (Signed)
Virtual Medical Office Visit  Patient:  Rebecca Paul      Age: 67 y.o.       Sex:  female  Date:   12/16/2022  PCP:    Farrel Conners, MD   [place] Provider: Farrel Conners, MD    Assessment/Plan:   Summary assessment:  Emerie was seen today for covid positive.  COVID-19 Patient is outside the window for Paxlovid therapy. I recommended continued use of OTC mucinex and she may use her nasal spray which I have refilled for her below. RTC if symptoms worsen or persist.  Cough, unspecified type -     POC COVID-19 BinaxNow -     POCT Influenza A/B  Seasonal allergies -     Azelastine-Fluticasone; Place 1 spray into the nose every 12 (twelve) hours.  Dispense: 23 g; Refill: 5  Controlled type 2 diabetes mellitus with hyperglycemia, without long-term current use of insulin (HCC) -     FreeStyle Precision Neo Test; 1 each by Other route 2 (two) times daily. Dx E11.9  Dispense: 100 each; Refill: 12     No follow-ups on file.   She was advised to call the office or go to ER if her condition worsens    Subjective:   Rebecca Paul is a 67 y.o. female with PMH significant for: Past Medical History:  Diagnosis Date   Allergy    more sinus issues    Anemia    yrs past as child , with pregnancy    Arthritis    shoulders - not diagnosed    Headache(784.0)    Hyperlipidemia    Hyperparathyroidism ([place]) 07/28/2021   Hypertension    IGT (impaired glucose tolerance)    Kidney stone    Osteopenia    Plantar fasciitis      Presenting today with: Chief Complaint  Patient presents with   Covid Positive    Onset Saturday - ST, cough, head congestion, no fever, some body aches.  "Feels like the flu".     She clarifies and reports that her condition: Patient has been positive for COVID for the past week. States it started about 7 days ago, started with sore throat, coughing, chills, nasal congestion, sneezing, sinus drainage continues down the back of her  throat. She reports that her symptoms are improving, no further chills or fever.  She denies having any: Chest pain, difficulty breathing,           Objective/Observations  Physical Exam:  Polite and friendly Gen: NAD, resting comfortably Pulm: Normal work of breathing Neuro: Grossly normal, moves all extremities Psych: Normal affect and thought content   No images are attached to the encounter or orders placed in the encounter.    Results: Results for orders placed or performed in visit on 12/16/22  POC COVID-19  Result Value Ref Range   SARS Coronavirus 2 Ag Positive (A) Negative  POC Influenza A/B  Result Value Ref Range   Influenza A, POC Negative Negative   Influenza B, POC Negative Negative     Recent Results (from the past 2160 hour(s))  POC HgB A1c     Status: Abnormal   Collection Time: 09/20/22  3:48 PM  Result Value Ref Range   Hemoglobin A1C 5.8 (A) 4.0 - 5.6 %   HbA1c POC (<> result, manual entry)     HbA1c, POC (prediabetic range)     HbA1c, POC (controlled diabetic range)    POC COVID-19  Status: Abnormal   Collection Time: 12/16/22  1:22 PM  Result Value Ref Range   SARS Coronavirus 2 Ag Positive (A) Negative  POC Influenza A/B     Status: None   Collection Time: 12/16/22  1:25 PM  Result Value Ref Range   Influenza A, POC Negative Negative   Influenza B, POC Negative Negative           Virtual Visit via Video   I connected with Levell July on 12/16/22 at  1:30 PM EST by a video enabled telemedicine application and verified that I am speaking with the correct person using two identifiers. The limitations of evaluation and management by telemedicine and the availability of in person appointments were discussed. The patient expressed understanding and agreed to proceed.   Percentage of appointment time on video:  100% Patient location: Home Provider location: [facility] Persons participating in the virtual visit:  Myself and Patient

## 2022-12-17 ENCOUNTER — Other Ambulatory Visit: Payer: Self-pay | Admitting: *Deleted

## 2022-12-17 NOTE — Telephone Encounter (Signed)
[facility] faxed a prior auth request for Azelastine-fluticasone [id number].  PA was sent to [id number] pending review by insurance.

## 2023-01-03 ENCOUNTER — Ambulatory Visit (INDEPENDENT_AMBULATORY_CARE_PROVIDER_SITE_OTHER): Payer: Medicare Other | Admitting: Family Medicine

## 2023-01-03 ENCOUNTER — Encounter: Payer: Self-pay | Admitting: Family Medicine

## 2023-01-03 VITALS — Ht 61.0 in | Wt 153.2 lb

## 2023-01-03 DIAGNOSIS — E119 Type 2 diabetes mellitus without complications: Secondary | ICD-10-CM

## 2023-01-03 NOTE — Patient Instructions (Signed)
You may want to schedule an appt with Dr. Valentina Lucks, our PharmD regarding use of your CGM.  You can do this at the front desk or by calling [phone].    Recommendations: - TAKE YOUR METFORMIN WITH BREAKFAST AND DINNER CONSISTENTLY.      - Aim for a more consistent dinner time (as well as other meal times).  This also means getting on to a more consistent sleep schedule.      - Internet search: Sleep and diabetes; consistency of sleep or eating schedule and blood sugar control; circadian rhythm and blood sugar control.   - Sleep schedule: Set a consistent getting up time, and force yourself to do this.  Never nap more than 30 minutes, and never later than 3 PM.   - Eat, rather than drink, you fruit.  Juice is a concentrated source of calories and sugar.  If you feel you MUST have some juice, try mixing it with seltzer or club soda.  Whole fruits and vegetables are MUCH better sources of vitamin C as well as other nutrients.   - Try vanilla soy milk in place of your coffee creamer.  This will provide a bit of protein and for very little carb.   Let's eliminate the term "cheat day" from your vocabulary.    Regularity of sleep schedule will be helpful in managing your blood sugar and blood pressure.  Diet for Diabetes  Carbohydrate includes starch, sugar, and fiber.  Of these, only sugar and starch raise blood glucose.  (Fiber is found in fruits, vegetables [especially skin, seeds, and stalks], whole grains, and beans.)   Starchy (carb) foods: Bread, rice, pasta, potatoes, corn, cereal, grits, crackers, bagels, muffins, all baked goods.  (Fruit, milk, and yogurt also have carbohydrate, but most of these foods will not spike your blood sugar as most starchy or sweet foods will.)  A few fruits do cause high blood sugars; use small portions of bananas (limit to 1/2 at a time), grapes, watermelon, and oranges.   Protein foods: Meat, fish, poultry, eggs, dairy foods, and beans such as pinto and kidney beans  (beans also provide carbohydrate).   Specific Behavioral Goals:  1. Eat at least 3 REAL meals and 1-2 snacks per day.  Eat breakfast within one hour of getting up.  Have something to eat at least every 5 hours while awake.  - A REAL breakfast needs to include both starch and protein foods.   - A REAL meal for lunch or dinner includes at least some protein, some starch, and vegetables and/or fruit.    2. Limit starchy foods to TWO portions per meal and ONE per snack. ONE portion of a starchy food is equal to the following:  - ONE slice of bread (or its equivalent, such as half of a hamburger bun).  - 1/2 cup of a "scoopable" starchy food such as potatoes or rice.  - 15 grams of Total Carbohydrate as shown on food label.  - 4 ounces of a sweet drink (including fruit juice).  3. Include vegetables in at least 8 meals per week.  - Fresh or frozen vegetables are best.  - Keep frozen vegetables on hand for a quick option.    - Another way to get vegetables:  If you ever used canned soup, first microwave some frozen veg's, then add the soup and reheat.    Follow-up appt on Thursday, April 4 at 3 PM.

## 2023-01-03 NOTE — Progress Notes (Signed)
Medical Nutrition Therapy PCP Rebecca Freshwater, MD, [facility] Appt start time: 1500 end time: 1600 (1 hour) Primary concerns today: Weight management and Blood sugar control.   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  Currently Metformin- & diet-controlled, and recently started CGM.  Other diagnoses include HTN, HLD, vit D deficiency, osteopenia, and CAD.  Diagnosed with COVID on 12/16/22.  Rebecca Paul has a family history of DM, and as a retired Education officer, museum, she wants to understand all she can do to manage her BG.  Both her mom and sister have DM2.   Med hx includes parathyroidectomy May 2023 (hyperparathyroidism dx'd with 2021 serum Ca of 11.2 & PTH of 76); and lithotripsy for kidney stones in 2000.  Post-parathyroidectomy she took 1200 mg supplemental Ca w/ vit D for 6 months.  Dietary Ca sources include dairy foods (cheese and occasional yogurt), leafy greens, broccoli, beans.  Also uses cream in coffee daily, but this is a negligible source of Ca (14 mg/tbsp).    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  She has enjoyed being able to set her own schedule, including sleep and eating times, which are now quite erratic.    Learning Readiness: Ready   Usual eating pattern: 2-3 meals and 2 snacks per day. Weight: 153.2 lb (148 lb on 12/16/22; ht is 61".)  CGM: Time in range has been 99% in past 7 days and 98% last 14 days.   Frequent foods and beverages: water, coffee with 2-3 tbsp diet flavored creamer, 4 oz orange juice ~3 X wk, 12 oz ginger ale ~1 X wk; pasta, bread, leafy greens 3-4 X wk, chx, ham, deli [place], fish, beans, cheese, eggs, fruit ~3 X wk.   Avoided foods: none.   Usual physical activity: Walks 60-90 min X ~2 wk.  Has not been to gym in a while.  Sleep: Estimates she gets ~5 hrs per night, bedtime ~12 AM, and up ~7-8 AM.  Often wakes ~3 AM; watches TV at this time 3-4 X  wk.  Naps ~60 minutes 4-5 X wk, starting ~3 PM or as late as 6 PM.    24-hr recall: (Up at 8 AM) B (9 AM)-   2 eggs, 1 oz corned beef, 1 c coffee, 2-3 tbsp regular flavored creamer, 4 oz o.j. Snk ( AM)-   --- L (2 PM)-  2 oz Kettle chips, 12 oz ginger ale Snk (4 PM)-  1/2 c tuna salad, 2 slc bread, water D (10 PM)-  ~1 c chx noodle soup, 2 tbsp rotisserie chx, water Snk ( PM)-  --- Typical day? No. Usually has a veg.    Nutritional Diagnosis:  NB-1.1 Food and nutrition-related knowledge deficit As related to BG management.  As evidenced by expressed interest in learning how to best manage BG levels.  Handouts given during visit include: After-Visit Summary (AVS)  Demonstrated degree of understanding via:  Teach Back  Barriers to learning/adherence to lifestyle change: Erratic sleep schedule, which leads to erratic eating schedule, and results in inadequate sleep.      Monitoring/Evaluation:  Dietary intake, exercise, BGs, and body weight in 2 week(s).

## 2023-01-05 DIAGNOSIS — H524 Presbyopia: Secondary | ICD-10-CM | POA: Diagnosis not present

## 2023-01-05 DIAGNOSIS — E119 Type 2 diabetes mellitus without complications: Secondary | ICD-10-CM | POA: Diagnosis not present

## 2023-01-05 LAB — HM DIABETES EYE EXAM

## 2023-01-18 ENCOUNTER — Ambulatory Visit: Payer: Self-pay | Admitting: *Deleted

## 2023-01-18 NOTE — Telephone Encounter (Signed)
  Chief Complaint: Pt called into the community line of the [facility] with questions regarding her Freestyle Glucose meter and sensor. Symptoms: N/A Frequency: N/A Pertinent Negatives: Patient denies N/A Disposition: [] ED /[] Urgent Care (no appt availability in office) / [] Appointment(In office/virtual)/ []  [employer] [facility]/ [] Home Care/ [] Refused Recommended Disposition /[] [facility]/ [x]  Follow-up with PCP Additional Notes: I encouraged her to follow up with her PCP since she is the one who prescribed the sensor.   She mentioned she did think her PCP knew much about these sensors and didn't feel comfortable discussing this with Dr. Legrand Como.   I also recommended talking with her pharmacist as they also could answer her questions and help her with her glucose ranges that she had questions about too.   She was agreeable to talking with her pharmacist and thanked me for my help.

## 2023-01-18 NOTE — Telephone Encounter (Signed)
Message from Shan Levans sent at 01/18/2023 11:04 AM EDT  Summary: Diabetic Monitor   Patient has questions about her Freestyle meter. Patient is not sure if it is reading correctly.          Call History   Type Contact Phone/Fax User  01/18/2023 11:02 AM EDT Phone (Incoming) Rebecca Paul, Rebecca Paul (Self) [phone] Lemmie Evens) Mabe, Sherie Don   Reason for Disposition  [1] Caller requesting NON-URGENT health information AND [2] PCP's office is the best resource    Needing advice on using her new Freestyle glucose meter and sensor.  Also about the glucose ranges she should be in.  Answer Assessment - Initial Assessment Questions 1. REASON FOR CALL or QUESTION: "What is your reason for calling today?" or "How can I best help you?" or "What question do you have that I can help answer?"     Pt called in on the community line with questions about her Freestyle meter. I have a question about my meter.        I've been on it for 3 sensors now.    My questions are related to my log in numbers.    My dietitian told me my numbers are looking good.    My average glucose and low glucose events are showing up too.    I've had 2 events of glucose going down.    My dietitian told me to talk with a pharmacist about the meter.    I called this line because I don't feel my PCP, Dr. Legrand Como is very knowledgeable about how to use these meters.   This is all new to me.    I let her know she needed to talk with her pharmacist because they could advise her on how to use it.   As far as her glucose ranges and events she would also need to talk with her PCP or pharmacist to see what they have prescribed for her as far as the ranges of her high and low glucose.    Pt. Said she was calling the "Get Care Now" line because she didn't feel comfortable talking with her PCP.   She doesn't seem to have much knowledge about the sensor and meter.  Pt was agreeable to calling her pharmacist for further guidance.  Protocols  used: Information Only Call - No Triage-A-AH

## 2023-01-20 ENCOUNTER — Ambulatory Visit (INDEPENDENT_AMBULATORY_CARE_PROVIDER_SITE_OTHER): Payer: Medicare Other | Admitting: Family Medicine

## 2023-01-20 DIAGNOSIS — E119 Type 2 diabetes mellitus without complications: Secondary | ICD-10-CM

## 2023-01-20 NOTE — Patient Instructions (Addendum)
Ask Dr. Legrand Como for a referral for Medical Nutrition Therapy.  Ask her to refer to me specifically, and if she is unsure how to do that, have her message me in Epic: Alease Medina, PhD, RD at [facility].  Reiterated from last appt:  - TAKE YOUR METFORMIN WITH BREAKFAST AND DINNER CONSISTENTLY.   - Aim for a more consistent dinner time (as well as other meal times).   - Sleep schedule: Set a consistent getting up time, and force yourself to do this.  Never nap more than 30 minutes, and never later than 3 PM.      - Do an Internet search on sleep and diabetes; regularity of sleep schedule will help manage your blood sugar and blood pressure. - Eat, rather than drink, you fruit.     Specific Behavioral Goals:  1. Eat at least 3 REAL meals and 1-2 snacks per day.  Eat breakfast within one hour of getting up.  Have something to eat at least every 5 hours while awake.  - A REAL breakfast needs to include both starch and protein foods.   - A REAL meal for lunch or dinner includes at least some protein, some starch, and vegetables and/or fruit.   2. Limit starchy foods to TWO portions per meal and ONE per snack. ONE portion of a starchy food is equal to the following:  - ONE slice of bread (or its equivalent, such as half of a hamburger bun).  - 1/2 cup of a "scoopable" starchy food such as potatoes or rice.  - 15 grams of Total Carbohydrate as shown on food label.  - 4 ounces of a sweet drink (including fruit juice). 3. Include vegetables in at least 8 meals per week.  - Fresh or frozen vegetables are best.  - Keep frozen vegetables on hand for a quick option.    - Another way to get vegetables:  If you ever used canned soup, first microwave some frozen veg's, then add the soup and reheat.    Document progress on your goals using the Goals Sheet provided today.    Follow-up appt on Thursday, April 25 at 3 PM (at which we'll address your question of probiotics).    Food and Nutrition  Websites Type in the exact website name below each choice, or the bold-print name identifying the site.    Dr. Assunta Gambles:  Created by pediatrician Riley Kill MD, MPH to help her patients improve their food choices, the site offers recipes and a wealth of information via videos and articles.   https://www.doctoryum.org/  [facility]: Lots of reliable information about food, nutrition, gardening, and physical activity.   https://[place].MarketLookup.fi  BestTheory.uy: The Academy of Nutrition and Dietetics provides lots of information about nutrition and other aspects of health, including for different segments of the population, i.e., kids, seniors, men, women, and LGBTQ.   PokerBag.at  Diabetes.org: The American Diabetes Association offers helpful nutrition, meal planning, and shopping tips for everyone whether you have diabetes or not.   https://diabetes.org/  Heart.org: In addition to heart-related health topics, this site offers excellent suggestions for healthy foods and behaviors for everyone, not just for those with or at risk of heart disease.   SatelliteSeeker.no

## 2023-01-20 NOTE — Progress Notes (Signed)
Medical Nutrition Therapy PCP Rebecca Freshwater, MD, [facility] Appt start time: 1500 end time: 1600 (1 hour) Primary concerns today: Weight management and Blood sugar control.   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  Currently Metformin- & diet-controlled, and recently started CGM.  Other diagnoses include HTN, HLD, vit D deficiency, osteopenia, and CAD.  Diagnosed with COVID on 12/16/22.  Rebecca Paul has a family history of DM, and as a retired Education officer, [employer], she wants to understand all she can do to manage her BG.  Both her mom and sister have DM2.   Med hx includes parathyroidectomy May 2023 (hyperparathyroidism dx'd with 2021 serum Ca of 11.2 & PTH of 76); and lithotripsy for kidney stones in 2000.  Post-parathyroidectomy she took 1200 mg supplemental Ca w/ vit D for 6 months.  Dietary Ca sources include dairy foods (cheese and occasional yogurt), leafy greens, broccoli, beans.  Also uses cream in coffee daily, but this is a negligible source of Ca (14 mg/tbsp).    Assessment:  Rebecca Paul has not followed up with a pharm appt with Dr. Valentina Lucks b/c she cannot be seen here as with a non-[facility] PCP.  I will message Dr. Legrand Como to ask if she has some provision for CGM education in their office.   She was provided a Goals Sheet today to document progress on goals established at 01/03/23 appt.   Usual eating pattern: 2-3 meals and 2 snacks per day. Weight: Did not check per pt request.  (153.2 lb on 01/03/23; ht is 61".)  CGM: Time in range has been 96%, and avg BG has been 107.  Has had 4 alarms for BG <70 in the past 7 days.   Usual physical activity: Walks 60-90 min ~2 wk; has done gym workouts ~1 X wk.  Sleep: Estimates she gets ~5 hrs per night, bedtime ~12 AM, and up ~7-8 AM.  Often wakes ~3 AM; watches TV at this time 3-4 X wk.  Naps ~30 minutes 3-4 X wk, starting before 4 PM.  Trying to get to bed earlier.  Still  watches TV sometimes when she can't sleep.    24-hr recall:  (Up at 7 AM; FBG 101 ) B (7:30 AM)-  --- - 10:40am BG was 136 -  Snk (11 AM)-  2 eggs, 1/3 c cooked grits, 1 c coffee, 2 tbsp diet flavored creamer L (2 PM)-  1 turkey&cheese sandwich, 1 tbsp mayo, handful Kettle chips, water Snk ( PM)-  --- D (7 PM)-  3/4 c mashed potatoes, 1 1/2 tsp butter, 1/2 cucumber, ranch dressing, 1/2 c greens, 1/2 [place] leg, water Snk ( PM)-  --- Typical day? Yes.     Nutritional Diagnosis:  NB-1.1 Food and nutrition-related knowledge deficit As related to BG management.  As evidenced by expressed interest in learning how to best manage BG levels.  Handouts given during visit include: After-Visit Summary (AVS)  Demonstrated degree of understanding via:  Teach Back  Barriers to learning/adherence to lifestyle change: Erratic sleep schedule, which leads to erratic eating schedule, and results in inadequate sleep.      Monitoring/Evaluation:  Dietary intake, exercise, CGM data, and body weight in 3 week(s).

## 2023-01-27 ENCOUNTER — Ambulatory Visit (INDEPENDENT_AMBULATORY_CARE_PROVIDER_SITE_OTHER): Payer: Medicare Other | Admitting: Family Medicine

## 2023-01-27 ENCOUNTER — Ambulatory Visit (INDEPENDENT_AMBULATORY_CARE_PROVIDER_SITE_OTHER): Payer: Medicare Other

## 2023-01-27 ENCOUNTER — Encounter: Payer: Self-pay | Admitting: Family Medicine

## 2023-01-27 VITALS — BP 130/80 | HR 93 | Temp 98.1°F | Ht 62.0 in | Wt 148.8 lb

## 2023-01-27 DIAGNOSIS — M25561 Pain in right knee: Secondary | ICD-10-CM | POA: Diagnosis not present

## 2023-01-27 DIAGNOSIS — Z0000 Encounter for general adult medical examination without abnormal findings: Secondary | ICD-10-CM | POA: Diagnosis not present

## 2023-01-27 DIAGNOSIS — Z87442 Personal history of urinary calculi: Secondary | ICD-10-CM | POA: Diagnosis not present

## 2023-01-27 DIAGNOSIS — E1165 Type 2 diabetes mellitus with hyperglycemia: Secondary | ICD-10-CM

## 2023-01-27 DIAGNOSIS — E785 Hyperlipidemia, unspecified: Secondary | ICD-10-CM | POA: Diagnosis not present

## 2023-01-27 DIAGNOSIS — I10 Essential (primary) hypertension: Secondary | ICD-10-CM | POA: Diagnosis not present

## 2023-01-27 DIAGNOSIS — M1711 Unilateral primary osteoarthritis, right knee: Secondary | ICD-10-CM | POA: Diagnosis not present

## 2023-01-27 LAB — TSH: TSH: 1.07 u[IU]/mL (ref 0.35–5.50)

## 2023-01-27 LAB — LIPID PANEL
Cholesterol: 207 mg/dL — ABNORMAL HIGH (ref 0–200)
HDL: 65.3 mg/dL (ref >39.00)
LDL Cholesterol: 123 mg/dL — ABNORMAL HIGH (ref 0–99)
NonHDL: 142.04
Total CHOL/HDL Ratio: 3
Triglycerides: 93 mg/dL (ref 0.0–149.0)
VLDL: 18.6 mg/dL (ref 0.0–40.0)

## 2023-01-27 LAB — COMPREHENSIVE METABOLIC PANEL
ALT: 8 U/L (ref 0–35)
AST: 15 U/L (ref 0–37)
Albumin: 4.7 g/dL (ref 3.5–5.2)
Alkaline Phosphatase: 51 U/L (ref 39–117)
BUN: 18 mg/dL (ref 6–23)
CO2: 27 mEq/L (ref 19–32)
Calcium: 10.1 mg/dL (ref 8.4–10.5)
Chloride: 102 mEq/L (ref 96–112)
Creatinine, Ser: 0.88 mg/dL (ref 0.40–1.20)
GFR: 68.79 mL/min (ref >60.00)
Glucose, Bld: 95 mg/dL (ref 70–99)
Potassium: 4.3 mEq/L (ref 3.5–5.1)
Sodium: 139 mEq/L (ref 135–145)
Total Bilirubin: 0.5 mg/dL (ref 0.2–1.2)
Total Protein: 8.5 g/dL — ABNORMAL HIGH (ref 6.0–8.3)

## 2023-01-27 LAB — MICROALBUMIN / CREATININE URINE RATIO
Creatinine,U: 100.5 mg/dL
Microalb Creat Ratio: 1.4 mg/g (ref 0.0–30.0)
Microalb, Ur: 1.4 mg/dL (ref 0.0–1.9)

## 2023-01-27 LAB — HEMOGLOBIN A1C: Hgb A1c MFr Bld: 6.3 % (ref 4.6–6.5)

## 2023-01-27 MED ORDER — FREESTYLE LIBRE 3 SENSOR MISC: 1.0000 | 11 refills | Status: DC

## 2023-01-27 NOTE — Progress Notes (Signed)
Complete physical exam  Patient: Rebecca Paul   DOB: 1956-02-26   67 y.o. Female  MRN: [id number]  Subjective:    Chief Complaint  Patient presents with   Annual Exam    Rebecca Paul is a 67 y.o. female who presents today for a complete physical exam. She reports consuming a general diet. Gym/ health club routine includes cardio and light weights. She generally feels well. She reports sleeping well. She does not have additional problems to discuss today.    Most recent fall risk assessment:    01/27/2023   12:55 PM  Fall Risk   Falls in the past year? 1  Number falls in past yr: 1  Injury with Fall? 0  Risk for fall due to : No Fall Risks  Follow up Falls evaluation completed     Most recent depression screenings:    01/27/2023   12:55 PM 12/16/2022    1:22 PM  PHQ 2/9 Scores  PHQ - 2 Score 0 0  PHQ- 9 Score 0 0    Vision:Within last year and Dental: No current dental problems and Receives regular dental care  Patient Active Problem List   Diagnosis Date Noted   Mild CAD 11/25/2021   Hyperlipidemia 11/25/2021   Preoperative cardiovascular examination 11/25/2021   Osteopenia 08/25/2020   Vitamin D deficiency 11/20/2019   Hyperparathyroidism 11/20/2019   Hypercalcemia 11/20/2019   DM II (diabetes mellitus, type II), controlled 04/09/2019   BACK PAIN 07/06/2010   Essential hypertension 04/22/2009   Dyslipidemia 04/02/2008   NEPHROLITHIASIS 10/30/2007      Patient Care Team: Karie Georges, MD as PCP - General (Family Medicine) Thomasene Ripple, DO as PCP - Cardiology (Cardiology) Campbell Stall, MD as Attending Physician (Dermatology) [facility], [facility] Darner, RD as Dietitian (Family Medicine)   Outpatient Medications Prior to Visit  Medication Sig   amLODipine (NORVASC) 5 MG tablet Take 1 tablet by mouth once daily   Azelastine-Fluticasone 137-50 MCG/ACT SUSP Place 1 spray into the nose every 12 (twelve) hours.   benzonatate  (TESSALON) 100 MG capsule Take 1 capsule (100 mg total) by mouth 2 (two) times daily as needed for cough.   blood glucose meter kit and supplies KIT Dispense based on patient and insurance preference. Use up to four times daily as directed. (FOR ICD-9 250.00, 250.01).   blood glucose meter kit and supplies Use as directed once a day   Blood Glucose Monitoring Suppl (CONTOUR BLOOD GLUCOSE SYSTEM) w/Device KIT by Does not apply route.   Blood Glucose Monitoring Suppl (CONTOUR NEXT MONITOR) w/Device KIT Use as directed   Cholecalciferol (VITAMIN D3) 50 MCG (2000 UT) CAPS Take 1,000 Units by mouth 1 day or 1 dose.   co-enzyme Q-10 30 MG capsule Take 30 mg by mouth 3 (three) times daily.   famciclovir (FAMVIR) 250 MG tablet Take 1 tablet (250 mg total) by mouth 2 (two) times daily as needed.   Lancets MISC Use as directed three times a day   losartan (COZAAR) 100 MG tablet Take 1 tablet by mouth once daily   metFORMIN (GLUCOPHAGE) 500 MG tablet Take 1 tablet (500 mg total) by mouth 2 (two) times daily with a meal. Start with 1 tablet daily and increase to twice daily as tolerated.   rosuvastatin (CRESTOR) 10 MG tablet Take 1 tablet (10 mg total) by mouth daily.   [DISCONTINUED] Continuous Blood Gluc Sensor (FREESTYLE LIBRE 2 SENSOR) MISC 1 DEVICE BY DOES  NOT APPLY ROUTE EVERY 14 (FOURTEEN) DAYS.   [DISCONTINUED] glucose blood (FREESTYLE PRECISION NEO TEST) test strip 1 each by Other route 2 (two) times daily. Dx E11.9   No facility-administered medications prior to visit.    Review of Systems  HENT:  Negative for hearing loss.   Eyes:  Negative for blurred vision.  Respiratory:  Negative for shortness of breath.   Cardiovascular:  Negative for chest pain.  Gastrointestinal: Negative.   Genitourinary: Negative.   Musculoskeletal:  Negative for back pain.  Neurological:  Negative for headaches.  Psychiatric/Behavioral:  Negative for depression.   All other systems reviewed and are  negative.         Objective:     BP 130/80 (BP Location: Left Arm, Patient Position: Sitting, Cuff Size: Normal) Comment: performed by Mykal--jaf  Pulse 93   Temp 98.1 F (36.7 C) (Oral)   Ht 5\' 2"  (1.575 m)   Wt 148 lb 12.8 oz (67.5 kg)   SpO2 97%   BMI 27.22 kg/m    Physical Exam Vitals reviewed.  Constitutional:      Appearance: Normal appearance. She is well-groomed and normal weight.  Eyes:     Conjunctiva/sclera: Conjunctivae normal.  Neck:     Thyroid: No thyromegaly.  Cardiovascular:     Rate and Rhythm: Normal rate and regular rhythm.     Pulses: Normal pulses.     Heart sounds: S1 normal and S2 normal.  Pulmonary:     Effort: Pulmonary effort is normal.     Breath sounds: Normal breath sounds and air entry.  Abdominal:     General: Bowel sounds are normal.  Musculoskeletal:     Right lower leg: No edema.     Left lower leg: No edema.  Neurological:     Mental Status: She is alert and oriented to person, place, and time. Mental status is at baseline.     Gait: Gait is intact.  Psychiatric:        Mood and Affect: Mood and affect normal.        Speech: Speech normal.        Behavior: Behavior normal.        Judgment: Judgment normal.      No results found for any visits on 01/27/23.     Assessment & Plan:    Routine Health Maintenance and Physical Exam  Immunization History  Administered Date(s) Administered   Fluad Quad(high Dose 65+) 06/25/2022   Influenza Split 07/22/2011   Influenza Whole 07/18/2002, 08/18/2007, 07/30/2009, 07/06/2010   Influenza,inj,Quad PF,6+ Mos 08/06/2013, 08/13/2013, 08/11/2015, 07/18/2017, 09/01/2018, 07/16/2019, 07/03/2021   Influenza,inj,quad, With Preservative 10/14/2017   Influenza-Unspecified 07/18/2020   Moderna Sars-Covid-2 Vaccination 12/20/2019, 01/22/2020, 08/05/2020, 02/21/2021, 07/20/2021   PNEUMOCOCCAL CONJUGATE-20 09/20/2022   Pneumococcal Polysaccharide-23 07/31/2019   Td 05/18/2001   Tdap  06/15/2013   Zoster Recombinat (Shingrix) 05/01/2018, 07/03/2018    Health Maintenance  Topic Date Due   Medicare Annual Wellness (AWV)  Never done   HIV Screening  Never done   FOOT EXAM  10/28/2020   COVID-19 Vaccine (6 - 2023-24 season) 06/18/2022   OPHTHALMOLOGY EXAM  10/27/2022   Diabetic kidney evaluation - Urine ACR  01/02/2023   HEMOGLOBIN A1C  03/22/2023   INFLUENZA VACCINE  05/19/2023   Diabetic kidney evaluation - eGFR measurement  06/09/2023   DTaP/Tdap/Td (3 - Td or Tdap) 06/16/2023   MAMMOGRAM  08/11/2023   PAP SMEAR-Modifier  07/28/2024   COLONOSCOPY (Pts 45-73yrs Insurance coverage will  need to be confirmed)  01/24/2028   Pneumonia Vaccine 765+ Years old  Completed   DEXA SCAN  Completed   Hepatitis C Screening  Completed   Zoster Vaccines- Shingrix  Completed   HPV VACCINES  Aged Out    Discussed health benefits of physical activity, and encouraged her to engage in regular exercise appropriate for her age and condition.  Problem List Items Addressed This Visit       Unprioritized   Dyslipidemia   Relevant Orders   Lipid Panel   Essential hypertension   Relevant Orders   CMP   TSH   DM II (diabetes mellitus, type II), controlled   Relevant Medications   Continuous Blood Gluc Sensor (FREESTYLE LIBRE 3 SENSOR) MISC   Other Relevant Orders   Microalbumin/Creatinine Ratio, Urine   Hemoglobin A1c   Other Visit Diagnoses     Routine general medical examination at a health care facility    -  Primary  Normal physical exam findings today, we discussed healthy eating patterns. Handouts given. She continues to see the nutritionist for diabetic diet education. She is due for her annual labs today which I have ordered.    Acute pain of right knee       Relevant Orders   DG Knee Complete 4 Views Right   History of kidney stones       Relevant Orders   Urinalysis with Reflex Microscopic      Return in 6 months (on 07/29/2023) for DM, HTN.     Karie GeorgesBarbara M  Tazia Illescas, MD

## 2023-01-27 NOTE — Patient Instructions (Signed)

## 2023-01-28 LAB — URINALYSIS, ROUTINE W REFLEX MICROSCOPIC
Bilirubin Urine: NEGATIVE
Hgb urine dipstick: NEGATIVE
Ketones, ur: NEGATIVE
Nitrite: NEGATIVE
RBC / HPF: NONE SEEN (ref 0–?)
Specific Gravity, Urine: 1.02 (ref 1.000–1.030)
Total Protein, Urine: NEGATIVE
Urine Glucose: NEGATIVE
Urobilinogen, UA: 0.2 (ref 0.0–1.0)
pH: 6 (ref 5.0–8.0)

## 2023-02-01 ENCOUNTER — Telehealth: Payer: Self-pay | Admitting: Family Medicine

## 2023-02-01 DIAGNOSIS — E1165 Type 2 diabetes mellitus with hyperglycemia: Secondary | ICD-10-CM

## 2023-02-01 MED ORDER — FREESTYLE LIBRE 3 READER DEVI: 1.0000 | 1 refills | Status: DC

## 2023-02-01 MED ORDER — FREESTYLE LIBRE 3 SENSOR MISC
1.0000 | 11 refills | Status: DC
Start: 2023-02-01 — End: 2023-08-30

## 2023-02-01 NOTE — Progress Notes (Unsigned)
Cardiology Office Note:    Date:  02/02/2023   ID:  Rebecca Paul, DOB [birth year], MRN [id number]  PCP:  Karie Georges, MD  Cardiologist:  Thomasene Ripple, DO  Electrophysiologist:  None   Referring MD: Karie Georges, MD   Chief Complaint: follow-up of CAD  History of Present Illness:    Rebecca Paul is a 67 y.o. female with a history of mild non-obstructive CAD on coronary CTA in 09/2021, hypertension, hyperlipidemia, type 2 diabetes mellitus, hyperparathyroidism, and nephrolithiasis who is followed by Dr. Servando Salina and presents today for routine follow-up.   Patient was referred to Dr. Servando Salina in 08/2021 for further evaluation of intermittent chest pain. A coronary CTA was ordered for further evaluation and showed a coronary calcium score of 92 (86th percentile for age and sex) and mild non-obstructive CAD. She was last seen by Dr. Servando Salina in 11/2021 at which time she was doing well from a cardiac standpoint.   Patient presents today for follow-up. Overall doing well from a cardiac standpoint. She still has occasional sharp chest pain that last only a couple of seconds at a time and then resolves on its own. This is the same type of pain that initially led to her coronary CTA. This is stable. She states she is a mouth breathing and it will sometimes noticed so heavy mouth breathing with exertion but no significant shortness of breath. No orthopnea or PND. She notes some mild lower extremity edema when she flies for which she wears compression stockings. No other edema. She states she is sometimes aware of her heart beat at night but denies any heart racing or irregular heart beats. No lightheadedness, dizziness, or syncope.  Of note, she states she stopped taking Aspirin a few weeks ago due to some sharp left sided abdominal pain. She does not improvement in the pain after this but is not sure if it is related because she also has known kidney stones. She also reports intermittent reflux that  improves with certain Ginger Ale. She denies any known history of gastric ulcers.  Past Medical History:  Diagnosis Date   Allergy    more sinus issues    Anemia    yrs past as child , with pregnancy    Arthritis    shoulders - not diagnosed    Headache(784.0)    Hyperlipidemia    Hyperparathyroidism 07/28/2021   Hypertension    Kidney stone    Non-obstructive CAD    a. coronary CTA in 09/2021 showed a coronary calcium score of 92 (86th percentile for age and sex) and mild non-obstructive CAD   Osteopenia    Plantar fasciitis    Type 2 diabetes mellitus     Past Surgical History:  Procedure Laterality Date   ABDOMINAL HYSTERECTOMY     fibroids   CESAREAN SECTION     COLONOSCOPY  01/2018   normal   OOPHORECTOMY     dermoid tumor   TONSILLECTOMY AND ADENOIDECTOMY      Current Medications: Current Meds  Medication Sig   amLODipine (NORVASC) 5 MG tablet Take 1 tablet by mouth once daily   aspirin EC 81 MG tablet Take 1 tablet (81 mg total) by mouth daily. Swallow whole.   Azelastine-Fluticasone 137-50 MCG/ACT SUSP Place 1 spray into the nose every 12 (twelve) hours.   benzonatate (TESSALON) 100 MG capsule Take 1 capsule (100 mg total) by mouth 2 (two) times daily as needed for cough.   blood glucose meter  kit and supplies KIT Dispense based on patient and insurance preference. Use up to four times daily as directed. (FOR ICD-9 250.00, 250.01).   blood glucose meter kit and supplies Use as directed once a day   Blood Glucose Monitoring Suppl (CONTOUR BLOOD GLUCOSE SYSTEM) w/Device KIT by Does not apply route.   Blood Glucose Monitoring Suppl (CONTOUR NEXT MONITOR) w/Device KIT Use as directed   Cholecalciferol (VITAMIN D3) 50 MCG (2000 UT) CAPS Take 1,000 Units by mouth 1 day or 1 dose.   co-enzyme Q-10 30 MG capsule Take 30 mg by mouth 3 (three) times daily.   Continuous Glucose Receiver (FREESTYLE LIBRE 3 READER) DEVI 1 each by Does not apply route as directed.    Continuous Glucose Sensor (FREESTYLE LIBRE 3 SENSOR) MISC 1 each by Does not apply route every 14 (fourteen) days. Place 1 sensor on the skin every 14 days. Use to check glucose continuously   famciclovir (FAMVIR) 250 MG tablet Take 1 tablet (250 mg total) by mouth 2 (two) times daily as needed.   Lancets MISC Use as directed three times a day   losartan (COZAAR) 100 MG tablet Take 1 tablet by mouth once daily   metFORMIN (GLUCOPHAGE) 500 MG tablet Take 1 tablet (500 mg total) by mouth 2 (two) times daily with a meal. Start with 1 tablet daily and increase to twice daily as tolerated.   rosuvastatin (CRESTOR) 10 MG tablet Take 1 tablet (10 mg total) by mouth daily.     Allergies:   Dust mite extract   Social History   Socioeconomic History   Marital status: Widowed    Spouse name: Not on file   Number of children: Not on file   Years of education: Not on file   Highest education level: Bachelor's degree (e.g., BA, AB, BS)  Occupational History   Not on file  Tobacco Use   Smoking status: Never   Smokeless tobacco: Never  Vaping Use   Vaping Use: Never used  Substance and Sexual Activity   Alcohol use: Yes    Comment: rare glass of wine    Drug use: No   Sexual activity: Not Currently  Other Topics Concern   Not on file  Social History Narrative   Not on file   Social Determinants of Health   Financial Resource Strain: Low Risk  (12/16/2022)   Overall Financial Resource Strain (CARDIA)    Difficulty of Paying Living Expenses: Not hard at all  Food Insecurity: No Food Insecurity (12/16/2022)   Hunger Vital Sign    Worried About Running Out of Food in the Last Year: Never true    Ran Out of Food in the Last Year: Never true  Transportation Needs: No Transportation Needs (12/16/2022)   PRAPARE - Administrator, Civil Service (Medical): No    Lack of Transportation (Non-Medical): No  Physical Activity: Sufficiently Active (12/16/2022)   Exercise Vital Sign    Days  of Exercise per Week: 3 days    Minutes of Exercise per Session: 60 min  Stress: No Stress Concern Present (12/16/2022)   [employer] of Occupational Health - Occupational Stress Questionnaire    Feeling of Stress : Not at all  Social Connections: Unknown (12/16/2022)   Social Connection and Isolation Panel [NHANES]    Frequency of Communication with Friends and Family: Patient declined    Frequency of Social Gatherings with Friends and Family: Patient declined    Attends Religious Services: Patient declined  Active Member of Clubs or Organizations: Patient declined    Attends Banker Meetings: Not on file    Marital Status: Widowed     Family History: The patient's family history includes Cancer in her father and another family member; Diabetes in her maternal grandmother, mother, sister, and another family member; Heart attack in her sister; Hyperlipidemia in her mother and another family member; Hypertension in her mother and another family member; Kidney disease in an other family member; Obesity in her sister and sister; Thyroid disease in her mother. There is no history of Colon cancer, Colon polyps, Rectal cancer, or Stomach cancer.  ROS:   Please see the history of present illness.     EKGs/Labs/Other Studies Reviewed:    The following studies were reviewed:  Coronary CTA 09/29/2021: Impression: 1.  Mild non-obstructive CAD, CADRADS = 2. 2. Coronary calcium score of 92. This was 86th percentile for age and sex matched control. 3. Normal coronary origin with co- dominance.  EKG:  EKG ordered today. EKG personally reviewed and demonstrates normal sinus rhythm, rate 87 bpm, with no acute ST/T changes. Normal axis. Normal PR and QRS intervals. QTc 430 ms.  Recent Labs: 01/27/2023: ALT 8; BUN 18; Creatinine, Ser 0.88; Potassium 4.3; Sodium 139; TSH 1.07  Recent Lipid Panel    Component Value Date/Time   CHOL 207 (H) 01/27/2023 1222   CHOL 191 12/14/2021  1211   TRIG 93.0 01/27/2023 1222   TRIG 95 09/26/2006 1002   HDL 65.30 01/27/2023 1222   HDL 60 12/14/2021 1211   CHOLHDL 3 01/27/2023 1222   VLDL 18.6 01/27/2023 1222   LDLCALC 123 (H) 01/27/2023 1222   LDLCALC 119 (H) 12/14/2021 1211   LDLCALC 116 (H) 08/25/2020 1158   LDLDIRECT 140.3 12/04/2012 1039    Physical Exam:    Vital Signs: BP 120/86   Pulse 87   Ht  (1.549 m)   Wt 150 lb (68 kg)   SpO2 96%   BMI 28.34 kg/m     Wt Readings from Last 3 Encounters:  02/02/23 150 lb (68 kg)  01/27/23 148 lb 12.8 oz (67.5 kg)  01/03/23 153 lb 3.2 oz (69.5 kg)     General: 67 y.o. female in no acute distress. HEENT: Normocephalic and atraumatic. Sclera clear.  Neck: Supple. No carotid bruits. No JVD. Heart: RRR. Distinct S1 and S2. No murmurs, gallops, or rubs. Radial pulses 2+ and equal bilaterally. Lungs: No increased work of breathing. Clear to ausculation bilaterally. No wheezes, rhonchi, or rales.  Abdomen: Soft, non-distended, and non-tender to palpation.  Extremities: No lower extremity edema.    Skin: Warm and dry. Neuro: Alert and oriented x3. No focal deficits. Psych: Normal affect. Responds appropriately.  Assessment:    1. Coronary artery disease involving native coronary artery of native heart without angina pectoris   2. Primary hypertension   3. Hyperlipidemia, unspecified hyperlipidemia type   4. Type 2 diabetes mellitus with complication, without long-term current use of insulin     Plan:    Mild Non-Obstructive CAD Coronary CTA in 09/2021 showed a coronary calcium score of 92 (86th percentile for age and sex) and mild non-obstructive CAD. - She describes occasional very atypical chest pain but no angina. Does not sound cardiac in nature. - She stopped her Aspirin a few weeks ago due to left sided abdominal. Pain did improve with this but she also has known kidney stones so unclear if it was actually related to the Aspirin.  She also has a history of  GERD but no known gastric ulcers. Recommended retrying enteric coated Aspirin 81mg  daily and letting us know if she has recurrent abdominal pain. - Continue Crestor 10mg  daily.  - No additional ischemic work-up necessary at this time.  Hypertension BP well controlled.  - Continue Amlodipine 5mg  daily and Losartan 100mg  daily.  Hyperlipidemia Lipid panel on 4/11/204: Total Cholesterol 207, Triglycerides 93, HDL 65.3, LDL 123. LDL goal <70 given CAD. - Currently on Crestor 10mg  daily. - Recommended increasing Crestor but patient is hesitant to do this. She would like to try diet modifications first. She has started seeing a Nurtritionist. Therefore, will continue current dose of Crestor for now and then recheck lipid panel and LFTs in 3 months.   Type 2 Diabetes Mellitus Hemoglobin A1c 6.3% on 01/27/2023.  - On Metformin.  - Management per PCP.   Disposition: Follow up in 1 year.    Medication Adjustments/Labs and Tests Ordered: Current medicines are reviewed at length with the patient today.  Concerns regarding medicines are outlined above.  Orders Placed This Encounter  Procedures   Lipid panel   Hepatic function panel   EKG 12-Lead   Meds ordered this encounter  Medications   aspirin EC 81 MG tablet    Sig: Take 1 tablet (81 mg total) by mouth daily. Swallow whole.    Dispense:  30 tablet    Refill:  12    Patient Instructions  Medication Instructions:   RESTART Aspirin 81 mg daily   *If you need a refill on your cardiac medications before your next appointment, please call your pharmacy*  Lab Work: Your physician recommends that you return for lab work in 3 months:  Fasting Lipid Panel-DO NOT eat or drink past midnight. Okay to have water and/or black coffee only the morning lab work. Hepatic (Liver) Function Test  If you have labs (blood work) drawn today and your tests are completely normal, you will receive your results only by: MyChart Message (if you have  MyChart) OR A paper copy in the mail If you have any lab test that is abnormal or we need to change your treatment, we will call you to review the results.   Testing/Procedures: NONE ordered at this time of appointment   Follow-Up: At [facility], you and your health needs are our priority.  As part of our continuing mission to provide you with exceptional heart care, we have created designated Provider Care Teams.  These Care Teams include your primary Cardiologist (physician) and Advanced Practice Providers (APPs -  Physician Assistants and Nurse Practitioners) who all work together to provide you with the care you need, when you need it.    Your next appointment:   1 year(s)  Provider:   Thomasene Ripple, DO  or Marjie Skiff, PA-C        Other Instructions     Signed, Corrin Parker, PA-C  02/02/2023 10:16 AM    [facility]

## 2023-02-01 NOTE — Telephone Encounter (Signed)
Pt is calling and believe she has UTI according to her urine results and pt has viewed her results on mychart please call pt at home number if unable to reach pt please call cell

## 2023-02-01 NOTE — Telephone Encounter (Signed)
Rx done. 

## 2023-02-01 NOTE — Telephone Encounter (Signed)
Pt is calling back and would like to come in person to speak with md and the next opening is not until 5-3 and pt does not want to wait that long . Please advise

## 2023-02-01 NOTE — Telephone Encounter (Signed)
Pt is calling and need new rx for freestyle libre 3 reader  [facility] [address], [place] - [address] Phone: [phone]  Fax: [phone]

## 2023-02-01 NOTE — Addendum Note (Signed)
Addended by: Johnella Moloney on: 02/01/2023 02:55 PM   Modules accepted: Orders

## 2023-02-02 ENCOUNTER — Ambulatory Visit: Payer: Medicare Other | Attending: Student | Admitting: Student

## 2023-02-02 ENCOUNTER — Encounter: Payer: Self-pay | Admitting: Student

## 2023-02-02 ENCOUNTER — Encounter: Payer: Self-pay | Admitting: Family Medicine

## 2023-02-02 VITALS — BP 120/86 | HR 87 | Ht 61.0 in | Wt 150.0 lb

## 2023-02-02 DIAGNOSIS — E118 Type 2 diabetes mellitus with unspecified complications: Secondary | ICD-10-CM

## 2023-02-02 DIAGNOSIS — I2510 Atherosclerotic heart disease of native coronary artery without angina pectoris: Secondary | ICD-10-CM | POA: Diagnosis not present

## 2023-02-02 DIAGNOSIS — I10 Essential (primary) hypertension: Secondary | ICD-10-CM

## 2023-02-02 DIAGNOSIS — E785 Hyperlipidemia, unspecified: Secondary | ICD-10-CM

## 2023-02-02 MED ORDER — ASPIRIN 81 MG PO TBEC: 81.0000 mg | DELAYED_RELEASE_TABLET | Freq: Every day | ORAL | 12 refills | Status: AC

## 2023-02-02 NOTE — Patient Instructions (Signed)
Medication Instructions:   RESTART Aspirin 81 mg daily   *If you need a refill on your cardiac medications before your next appointment, please call your pharmacy*  Lab Work: Your physician recommends that you return for lab work in 3 months:  Fasting Lipid Panel-DO NOT eat or drink past midnight. Okay to have water and/or black coffee only the morning lab work. Hepatic (Liver) Function Test  If you have labs (blood work) drawn today and your tests are completely normal, you will receive your results only by: MyChart Message (if you have MyChart) OR A paper copy in the mail If you have any lab test that is abnormal or we need to change your treatment, we will call you to review the results.   Testing/Procedures: NONE ordered at this time of appointment   Follow-Up: At [facility], you and your health needs are our priority.  As part of our continuing mission to provide you with exceptional heart care, we have created designated Provider Care Teams.  These Care Teams include your primary Cardiologist (physician) and Advanced Practice Providers (APPs -  Physician Assistants and Nurse Practitioners) who all work together to provide you with the care you need, when you need it.    Your next appointment:   1 year(s)  Provider:   Thomasene Ripple, DO  or Marjie Skiff, PA-C        Other Instructions

## 2023-02-02 NOTE — Telephone Encounter (Signed)
Called and spoke with patient, reassurance given, ok to close task

## 2023-02-03 ENCOUNTER — Other Ambulatory Visit: Payer: Self-pay | Admitting: Family Medicine

## 2023-02-09 ENCOUNTER — Telehealth: Payer: Self-pay | Admitting: *Deleted

## 2023-02-09 DIAGNOSIS — E1165 Type 2 diabetes mellitus with hyperglycemia: Secondary | ICD-10-CM

## 2023-02-09 NOTE — Telephone Encounter (Signed)
Referral placed.

## 2023-02-09 NOTE — Telephone Encounter (Signed)
-----   Message from Karie Georges, MD sent at 02/09/2023  2:07 PM EDT ----- Regarding: FW: Nutrition referral & CGM training Hey! Can you place this referral for the patient? Thanks! ----- Message ----- From: Linna Darner, RD Sent: 01/20/2023   6:06 PM EDT To: Karie Georges, MD Subject: Nutrition referral & CGM training              Iver Nestle has seen me for medical nutrition therapy (MNT), and although an insurance rep told her she doesn't need a referral, I don't believe this is accurate for Medicare.  Below are instructions for making a referral to me for MNT at [facility].   Meanwhile, I tried to get her to see our PharmD for additional help with her CGM (she has many legit Qs), but he can't see pts from outside PCPs.  Do you have any provision for CGM education in your practice?  Otherwise, you could refer her to the Nutrition & DM Education Services (NDES) for Diabetes Self-Management Training.  This is separate from MNT, and Medicare covers both 100% for pts with DM.   Ms. Hughston is scheduled to see you next Thur, 01/27/23.    Epic Referral: Diabetes Self-management Training/Education (DSMT/E) Medicare covers 10 hrs initial DSMT in 84-month period from the date of first class or visit  and 2 hours annually for each subsequent year. STEP 1. Order Ambulatory Referral to Nutrition (REF 120) STEP 2. Choose DEPARTMENT NDM-NUTRI DIAB MGT CTR STEP 3. Choose Type of Training Services and Number of Hours Requested  o Initial DSME/T - 10 hours o Annual Follow-up DSME/T - 2 hours o Telehealth STEP 4. Choose Special Needs (drop-down menu) STEP 5. Specify DSMT Content (drop-down menu) STEP 6. Under Type of MNT (drop-down menu), indicate DOES NOT APPLY  STEP 7. Certify medically necessary; click YES NOTE: Type in Comments if you want specific dietary concerns to be addressed.   ___________________________________________________________  Referral to Dr. Gerilyn Pilgrim for MNT  (Ambulatory Referral to Nutrition [REF50]): STEP 1. Choose DEPARTMENT SPECIALTY  FAMILY MEDICINE STEP 2. Choose DEPARTMENT  [facility] (becomes visible once you choose Dept. Specialty).   STEP 3. Choose Type of MNT & Number of Hours Medicare covers:  o Initial MNT (first year) - 3 hours o Initial MNT: enter hours in Comments o Annual Follow-up MNT - 2 hours o Annual Follow-up MNT: enter hours in Comments o Telehealth o Additional MNT services, same calendar year per RD: enter hours - and specify reason in Comments (change in medical condition, treatment, and/or diagnosis) " For example: "2 hours; additional education is medically necessary."  STEP 4. Choose ACCEPT; ask your patient to call Dr. Gerilyn Pilgrim for all initial appts: [phone].  NOTE: Type in Comments if you have specific dietary concerns to be addressed.    Let me know if any Qs.  Thanks, [employer]

## 2023-02-10 ENCOUNTER — Ambulatory Visit: Payer: Medicare Other | Admitting: Family Medicine

## 2023-03-03 ENCOUNTER — Other Ambulatory Visit: Payer: Self-pay | Admitting: Family Medicine

## 2023-03-04 ENCOUNTER — Other Ambulatory Visit: Payer: Self-pay | Admitting: Family Medicine

## 2023-03-04 DIAGNOSIS — E1165 Type 2 diabetes mellitus with hyperglycemia: Secondary | ICD-10-CM

## 2023-03-08 ENCOUNTER — Other Ambulatory Visit: Payer: Self-pay | Admitting: *Deleted

## 2023-03-08 NOTE — Telephone Encounter (Signed)
[facility] faxed a prior auth request for Freestyle Precision Neo Mis test strips.  PA was sent to [email or web address]-Key: [id number] pending review by insurance.

## 2023-04-01 DIAGNOSIS — N200 Calculus of kidney: Secondary | ICD-10-CM | POA: Diagnosis not present

## 2023-04-01 DIAGNOSIS — N281 Cyst of kidney, acquired: Secondary | ICD-10-CM | POA: Diagnosis not present

## 2023-04-01 DIAGNOSIS — R351 Nocturia: Secondary | ICD-10-CM | POA: Diagnosis not present

## 2023-04-11 ENCOUNTER — Ambulatory Visit (INDEPENDENT_AMBULATORY_CARE_PROVIDER_SITE_OTHER): Payer: Medicare Other | Admitting: Family Medicine

## 2023-04-11 ENCOUNTER — Encounter: Payer: Self-pay | Admitting: Family Medicine

## 2023-04-11 VITALS — BP 130/78 | HR 82 | Temp 98.2°F | Wt 154.4 lb

## 2023-04-11 DIAGNOSIS — I10 Essential (primary) hypertension: Secondary | ICD-10-CM | POA: Diagnosis not present

## 2023-04-11 DIAGNOSIS — Z0189 Encounter for other specified special examinations: Secondary | ICD-10-CM | POA: Diagnosis not present

## 2023-04-11 DIAGNOSIS — E119 Type 2 diabetes mellitus without complications: Secondary | ICD-10-CM | POA: Diagnosis not present

## 2023-04-11 NOTE — Progress Notes (Signed)
Established Patient Office Visit   Subjective  Patient ID: Rebecca Paul, female    DOB: 05-Mar-1956  Age: 67 y.o. MRN: [id number]  Chief Complaint  Patient presents with   Hemoglobin A1c Screening    Did check with ins, has been having low reading on freestyle libre 3, and headaches. Wants blood draw. Having issues with the numbers     Patient is a 67 year old female followed with Dr. Casimiro Needle and seen for follow-up.  Patient states she would like to have her A1c checked via blood draw.  States CGM has been giving her several low readings under 70s as well as several high readings over 200.  Taking metformin 500 mg twice daily.  States A1c done as POC testing has never been accurate for her.  Patient states last time it was done via POC it was 5.7% which did not seem right to her.  Patient also mentions it was listed as an indeterminate reading however no notes about the reading are listed in chart.    Past Medical History:  Diagnosis Date   Allergy    more sinus issues    Anemia    yrs past as child , with pregnancy    Arthritis    shoulders - not diagnosed    Headache(784.0)    Hyperlipidemia    Hyperparathyroidism (HCC) 07/28/2021   Hypertension    Kidney stone    Non-obstructive CAD    a. coronary CTA in 09/2021 showed a coronary calcium score of 92 (86th percentile for age and sex) and mild non-obstructive CAD   Osteopenia    Plantar fasciitis    Type 2 diabetes mellitus (HCC)    Past Surgical History:  Procedure Laterality Date   ABDOMINAL HYSTERECTOMY     fibroids   CESAREAN SECTION     COLONOSCOPY  01/2018   normal   OOPHORECTOMY     dermoid tumor   TONSILLECTOMY AND ADENOIDECTOMY     Social History   Tobacco Use   Smoking status: Never   Smokeless tobacco: Never  Vaping Use   Vaping Use: Never used  Substance Use Topics   Alcohol use: Yes    Comment: rare glass of wine    Drug use: No   Family History  Problem Relation Age of Onset   Cancer Father         prostate   Hypertension Mother    Hyperlipidemia Mother    Thyroid disease Mother    Diabetes Mother    Obesity Sister    Diabetes Sister    Diabetes Other    Hyperlipidemia Other    Hypertension Other    Cancer Other        prostate   Kidney disease Other    Obesity Sister    Heart attack Sister    Diabetes Maternal Grandmother    Colon cancer Neg Hx    Colon polyps Neg Hx    Rectal cancer Neg Hx    Stomach cancer Neg Hx    Allergies  Allergen Reactions   Dust Mite Extract Other (See Comments)    Congestion      ROS Negative unless stated above    Objective:     BP (!) 142/90 (BP Location: Left Arm, Patient Position: Sitting, Cuff Size: Normal)   Pulse 82   Temp 98.2 F (36.8 C) (Oral)   Wt 154 lb 6.4 oz (70 kg)   SpO2 98%   BMI 29.17 kg/m  Physical Exam Constitutional:      Appearance: Normal appearance.  HENT:     Head: Normocephalic and atraumatic.     Nose: Nose normal.     Mouth/Throat:     Mouth: Mucous membranes are moist.  Eyes:     Extraocular Movements: Extraocular movements intact.     Conjunctiva/sclera: Conjunctivae normal.  Cardiovascular:     Rate and Rhythm: Normal rate.  Pulmonary:     Effort: Pulmonary effort is normal.  Skin:    General: Skin is warm and dry.  Neurological:     Mental Status: She is alert and oriented to person, place, and time.      No results found for any visits on 04/11/23.    Assessment & Plan:  Controlled type 2 diabetes mellitus without complication, without long-term current use of insulin (HCC) -     Hemoglobin A1c  Essential hypertension  Patient requested diagnostic testing  Obtain A1c via blood draw.  Continue current meds.  Discussed importance of lifestyle modifications.  BP elevated.  Follow-up with PCP.  Return if symptoms worsen or fail to improve.   Deeann Saint, MD

## 2023-04-12 LAB — HEMOGLOBIN A1C: Hgb A1c MFr Bld: 6.2 % (ref 4.6–6.5)

## 2023-04-13 DIAGNOSIS — K080 Exfoliation of teeth due to systemic causes: Secondary | ICD-10-CM | POA: Diagnosis not present

## 2023-05-05 ENCOUNTER — Other Ambulatory Visit: Payer: Self-pay | Admitting: Family Medicine

## 2023-05-05 DIAGNOSIS — E1165 Type 2 diabetes mellitus with hyperglycemia: Secondary | ICD-10-CM

## 2023-05-18 ENCOUNTER — Encounter (INDEPENDENT_AMBULATORY_CARE_PROVIDER_SITE_OTHER): Payer: Self-pay

## 2023-06-01 ENCOUNTER — Ambulatory Visit: Payer: Medicare Other

## 2023-06-02 ENCOUNTER — Telehealth: Payer: Self-pay | Admitting: Family Medicine

## 2023-06-02 MED ORDER — FAMCICLOVIR 250 MG PO TABS: 250.0000 mg | ORAL_TABLET | Freq: Two times a day (BID) | ORAL | 1 refills | Status: AC | PRN

## 2023-06-02 NOTE — Telephone Encounter (Signed)
Prescription Request  06/02/2023  LOV: 01/27/2023  What is the name of the medication or equipment?  famciclovir (FAMVIR) 250 MG tablet   Have you contacted your pharmacy to request a refill? No   Which pharmacy would you like this sent to?  [facility] [address], [place] - [address] [place] [address] Phone: [phone] Fax: [phone]    Patient notified that their request is being sent to the clinical staff for review and that they should receive a response within 2 business days.   Please advise at Mobile [phone] (mobile)

## 2023-06-19 DIAGNOSIS — E119 Type 2 diabetes mellitus without complications: Secondary | ICD-10-CM | POA: Diagnosis not present

## 2023-06-22 ENCOUNTER — Ambulatory Visit (INDEPENDENT_AMBULATORY_CARE_PROVIDER_SITE_OTHER): Payer: Medicare Other

## 2023-06-22 VITALS — Ht 61.0 in | Wt 149.0 lb

## 2023-06-22 DIAGNOSIS — Z0000 Encounter for general adult medical examination without abnormal findings: Secondary | ICD-10-CM

## 2023-06-22 NOTE — Patient Instructions (Addendum)
Ms. Bodamer , Thank you for taking time to come for your Medicare Wellness Visit. I appreciate your ongoing commitment to your health goals. Please review the following plan we discussed and let me know if I can assist you in the future.   Referrals/Orders/Follow-Ups/Clinician Recommendations:   This is a list of the screening recommended for you and due dates:  Health Maintenance  Topic Date Due   Complete foot exam   10/28/2020   Flu Shot  05/19/2023   DTaP/Tdap/Td vaccine (3 - Td or Tdap) 06/16/2023   COVID-19 Vaccine (6 - 2023-24 season) 06/19/2023   Hemoglobin A1C  10/11/2023   Eye exam for diabetics  01/05/2024   Yearly kidney function blood test for diabetes  01/27/2024   Yearly kidney health urinalysis for diabetes  01/27/2024   Medicare Annual Wellness Visit  06/21/2024   Mammogram  07/19/2024   Colon Cancer Screening  01/24/2028   Pneumonia Vaccine  Completed   DEXA scan (bone density measurement)  Completed   Hepatitis C Screening  Completed   Zoster (Shingles) Vaccine  Completed   HPV Vaccine  Aged Out    Advanced directives: (Declined) Advance directive discussed with you today. Even though you declined this today, please call our office should you change your mind, and we can give you the proper paperwork for you to fill out.  Next Medicare Annual Wellness Visit scheduled for next year: Yes

## 2023-06-22 NOTE — Progress Notes (Signed)
Subjective:   Rebecca Paul is a 67 y.o. female who presents for Medicare Annual (Subsequent) preventive examination.  Visit Complete: Virtual  I connected with  Rebecca Paul on 06/22/23 by a audio enabled telemedicine application and verified that I am speaking with the correct person using two identifiers.  Patient Location: Home  Provider Location: Home Office  I discussed the limitations of evaluation and management by telemedicine. The patient expressed understanding and agreed to proceed.    Review of Systems    Vital Signs: Unable to obtain new vitals due to this being a telehealth visit.  Cardiac Risk Factors include: advanced age (>75men, >105 women);diabetes mellitus;hypertension     Objective:    Today's Vitals   06/22/23 1034  Weight: 149 lb (67.6 kg)  Height: 5\' 1"  (1.549 m)   Body mass index is 28.15 kg/m.     06/22/2023   10:43 AM 09/24/2019   10:29 AM 01/23/2018   10:48 AM  Advanced Directives  Does Patient Have a Medical Advance Directive? No No No  Would patient like information on creating a medical advance directive? No - Patient declined No - Patient declined     Current Medications (verified) Outpatient Encounter Medications as of 06/22/2023  Medication Sig   amLODipine (NORVASC) 5 MG tablet Take 1 tablet by mouth once daily   aspirin EC 81 MG tablet Take 1 tablet (81 mg total) by mouth daily. Swallow whole.   Azelastine-Fluticasone 137-50 MCG/ACT SUSP Place 1 spray into the nose every 12 (twelve) hours.   benzonatate (TESSALON) 100 MG capsule Take 1 capsule (100 mg total) by mouth 2 (two) times daily as needed for cough.   blood glucose meter kit and supplies KIT Dispense based on patient and insurance preference. Use up to four times daily as directed. (FOR ICD-9 250.00, 250.01).   blood glucose meter kit and supplies Use as directed once a day   Blood Glucose Monitoring Suppl (CONTOUR BLOOD GLUCOSE SYSTEM) w/Device KIT by Does not apply route.    Blood Glucose Monitoring Suppl (CONTOUR NEXT MONITOR) w/Device KIT Use as directed   Cholecalciferol (VITAMIN D3) 50 MCG (2000 UT) CAPS Take 1,000 Units by mouth 1 day or 1 dose.   co-enzyme Q-10 30 MG capsule Take 30 mg by mouth 3 (three) times daily.   Continuous Glucose Receiver (FREESTYLE LIBRE 3 READER) DEVI 1 each by Does not apply route as directed.   Continuous Glucose Sensor (FREESTYLE LIBRE 3 SENSOR) MISC 1 each by Does not apply route every 14 (fourteen) days. Place 1 sensor on the skin every 14 days. Use to check glucose continuously   famciclovir (FAMVIR) 250 MG tablet Take 1 tablet (250 mg total) by mouth 2 (two) times daily as needed.   Lancets MISC Use as directed three times a day   losartan (COZAAR) 100 MG tablet Take 1 tablet by mouth once daily   metFORMIN (GLUCOPHAGE) 500 MG tablet TAKE 1 TABLET BY MOUTH TWICE DAILY WITH A MEAL.  START WITH 1 TABLET DAILY AND INCREASE TO TWICE  DAILY AS TOLERATED   rosuvastatin (CRESTOR) 10 MG tablet Take 1 tablet (10 mg total) by mouth daily.   No facility-administered encounter medications on file as of 06/22/2023.    Allergies (verified) Dust mite extract   History: Past Medical History:  Diagnosis Date   Allergy    more sinus issues    Anemia    yrs past as child , with pregnancy    Arthritis  shoulders - not diagnosed    Headache(784.0)    Hyperlipidemia    Hyperparathyroidism (HCC) 07/28/2021   Hypertension    Kidney stone    Non-obstructive CAD    a. coronary CTA in 09/2021 showed a coronary calcium score of 92 (86th percentile for age and sex) and mild non-obstructive CAD   Osteopenia    Plantar fasciitis    Type 2 diabetes mellitus (HCC)    Past Surgical History:  Procedure Laterality Date   ABDOMINAL HYSTERECTOMY     fibroids   CESAREAN SECTION     COLONOSCOPY  01/2018   normal   OOPHORECTOMY     dermoid tumor   TONSILLECTOMY AND ADENOIDECTOMY     Family History  Problem Relation Age of Onset    Cancer Father        prostate   Hypertension Mother    Hyperlipidemia Mother    Thyroid disease Mother    Diabetes Mother    Obesity Sister    Diabetes Sister    Diabetes Other    Hyperlipidemia Other    Hypertension Other    Cancer Other        prostate   Kidney disease Other    Obesity Sister    Heart attack Sister    Diabetes Maternal Grandmother    Colon cancer Neg Hx    Colon polyps Neg Hx    Rectal cancer Neg Hx    Stomach cancer Neg Hx    Social History   Socioeconomic History   Marital status: Widowed    Spouse name: Not on file   Number of children: Not on file   Years of education: Not on file   Highest education level: Bachelor's degree (e.g., BA, AB, BS)  Occupational History   Not on file  Tobacco Use   Smoking status: Never   Smokeless tobacco: Never  Vaping Use   Vaping status: Never Used  Substance and Sexual Activity   Alcohol use: Yes    Comment: rare glass of wine    Drug use: No   Sexual activity: Not Currently  Other Topics Concern   Not on file  Social History Narrative   Not on file   Social Determinants of Health   Financial Resource Strain: Low Risk  (06/22/2023)   Overall Financial Resource Strain (CARDIA)    Difficulty of Paying Living Expenses: Not hard at all  Food Insecurity: No Food Insecurity (06/22/2023)   Hunger Vital Sign    Worried About Running Out of Food in the Last Year: Never true    Ran Out of Food in the Last Year: Never true  Transportation Needs: No Transportation Needs (06/22/2023)   PRAPARE - Administrator, Civil Service (Medical): No    Lack of Transportation (Non-Medical): No  Physical Activity: Sufficiently Active (06/22/2023)   Exercise Vital Sign    Days of Exercise per Week: 5 days    Minutes of Exercise per Session: 30 min  Stress: No Stress Concern Present (06/22/2023)   [employer] of Occupational Health - Occupational Stress Questionnaire    Feeling of Stress : Not at all  Social  Connections: Moderately Integrated (06/22/2023)   Social Connection and Isolation Panel [NHANES]    Frequency of Communication with Friends and Family: More than three times a week    Frequency of Social Gatherings with Friends and Family: More than three times a week    Attends Religious Services: More than 4 times per year  Active Member of Clubs or Organizations: Yes    Attends Banker Meetings: More than 4 times per year    Marital Status: Widowed    Tobacco Counseling Counseling given: Not Answered   Clinical Intake:  Pre-visit preparation completed: Yes  Pain : No/denies pain     BMI - recorded: 28.15 Nutritional Status: BMI 25 -29 Overweight Nutritional Risks: None Diabetes: Yes CBG done?: No Did pt. bring in CBG monitor from home?: No  How often do you need to have someone help you when you read instructions, pamphlets, or other written materials from your doctor or pharmacy?: 1 - Never  Interpreter Needed?: No  Information entered by :: Theresa Mulligan LPN   Activities of Daily Living    06/22/2023   10:42 AM  In your present state of health, do you have any difficulty performing the following activities:  Hearing? 0  Vision? 0  Difficulty concentrating or making decisions? 0  Walking or climbing stairs? 0  Dressing or bathing? 0  Doing errands, shopping? 0  Preparing Food and eating ? N  Using the Toilet? N  In the past six months, have you accidently leaked urine? N  Do you have problems with loss of bowel control? N  Managing your Medications? N  Managing your Finances? N  Housekeeping or managing your Housekeeping? N    Patient Care Team: Karie Georges, MD as PCP - General (Family Medicine) Thomasene Ripple, DO as PCP - Cardiology (Cardiology) Campbell Stall, MD as Attending Physician (Dermatology) [facility], [facility] Darner, RD as Dietitian (Family Medicine)  Indicate any recent Medical Services you may have  received from other than [facility] providers in the past year (date may be approximate).     Assessment:   This is a routine wellness examination for Rebecca Paul.  Hearing/Vision screen Hearing Screening - Comments:: Denies hearing difficulties   Vision Screening - Comments:: Wears rx glasses - up to date with routine eye exams with  [facility]  Dietary issues and exercise activities discussed:     Goals Addressed               This Visit's Progress     Increase physical activity (pt-stated)        Maintain good health.       Depression Screen    06/22/2023   10:41 AM 01/27/2023   12:55 PM 12/16/2022    1:22 PM 11/30/2022    4:04 PM 09/20/2022    3:44 PM 03/10/2022    2:34 PM 10/16/2021    3:39 PM  PHQ 2/9 Scores  PHQ - 2 Score 0 0 0 0 0 0 0  PHQ- 9 Score  0 0 0 0 0 0    Fall Risk    06/22/2023   10:42 AM 01/27/2023   12:55 PM 12/16/2022    9:09 AM 09/20/2022    2:47 PM 09/18/2018   10:34 AM  Fall Risk   Falls in the past year? 0 1 1 0 1  Number falls in past yr: 0 1 0 0 0  Injury with Fall? 0 0 0 0 1  Risk for fall due to : No Fall Risks No Fall Risks  No Fall Risks   Follow up Falls prevention discussed Falls evaluation completed  Falls evaluation completed     MEDICARE RISK AT HOME: Medicare Risk at Home Any stairs in or around the home?: Yes If so, are there any without  handrails?: No Home free of loose throw rugs in walkways, pet beds, electrical cords, etc?: Yes Adequate lighting in your home to reduce risk of falls?: Yes Life alert?: No Use of a cane, walker or w/c?: No Grab bars in the bathroom?: Yes Shower chair or bench in shower?: Yes Elevated toilet seat or a handicapped toilet?: Yes  TIMED UP AND GO:  Was the test performed?  No    Cognitive Function:        06/22/2023   10:43 AM  6CIT Screen  What Year? 0 points  What month? 0 points  What time? 0 points  Count back from 20 0 points  Months in reverse 0 points  Repeat phrase 0 points   Total Score 0 points    Immunizations Immunization History  Administered Date(s) Administered   Fluad Quad(high Dose 65+) 06/25/2022   Influenza Split 07/22/2011   Influenza Whole 07/18/2002, 08/18/2007, 07/30/2009, 07/06/2010   Influenza,inj,Quad PF,6+ Mos 08/06/2013, 08/13/2013, 08/11/2015, 07/18/2017, 09/01/2018, 07/16/2019, 07/03/2021   Influenza,inj,quad, With Preservative 10/14/2017   Influenza-Unspecified 07/18/2020   Moderna Sars-Covid-2 Vaccination 12/20/2019, 01/22/2020, 08/05/2020, 02/21/2021, 07/20/2021   PNEUMOCOCCAL CONJUGATE-20 09/20/2022   Pneumococcal Polysaccharide-23 07/31/2019   Td 05/18/2001   Tdap 06/15/2013   Zoster Recombinant(Shingrix) 05/01/2018, 07/03/2018    TDAP status: Due, Education has been provided regarding the importance of this vaccine. Advised may receive this vaccine at local pharmacy or Health Dept. Aware to provide a copy of the vaccination record if obtained from local pharmacy or Health Dept. Verbalized acceptance and understanding.  Flu Vaccine status: Due, Education has been provided regarding the importance of this vaccine. Advised may receive this vaccine at local pharmacy or Health Dept. Aware to provide a copy of the vaccination record if obtained from local pharmacy or Health Dept. Verbalized acceptance and understanding.  Pneumococcal vaccine status: Up to date  Covid-19 vaccine status: Declined, Education has been provided regarding the importance of this vaccine but patient still declined. Advised may receive this vaccine at local pharmacy or Health Dept.or vaccine clinic. Aware to provide a copy of the vaccination record if obtained from local pharmacy or Health Dept. Verbalized acceptance and understanding.  Qualifies for Shingles Vaccine? Yes   Zostavax completed Yes   Shingrix Completed?: Yes  Screening Tests Health Maintenance  Topic Date Due   FOOT EXAM  10/28/2020   INFLUENZA VACCINE  05/19/2023   DTaP/Tdap/Td (3 - Td  or Tdap) 06/16/2023   COVID-19 Vaccine (6 - 2023-24 season) 06/19/2023   HEMOGLOBIN A1C  10/11/2023   OPHTHALMOLOGY EXAM  01/05/2024   Diabetic kidney evaluation - eGFR measurement  01/27/2024   Diabetic kidney evaluation - Urine ACR  01/27/2024   Medicare Annual Wellness (AWV)  06/21/2024   MAMMOGRAM  07/19/2024   Colonoscopy  01/24/2028   Pneumonia Vaccine 44+ Years old  Completed   DEXA SCAN  Completed   Hepatitis C Screening  Completed   Zoster Vaccines- Shingrix  Completed   HPV VACCINES  Aged Out    Health Maintenance  Health Maintenance Due  Topic Date Due   FOOT EXAM  10/28/2020   INFLUENZA VACCINE  05/19/2023   DTaP/Tdap/Td (3 - Td or Tdap) 06/16/2023   COVID-19 Vaccine (6 - 2023-24 season) 06/19/2023    Colorectal cancer screening: Type of screening: Colonoscopy. Completed 01/23/18. Repeat every 10 years  Mammogram status: Completed 07/19/22. Repeat every year  Bone Density status: Completed 07/19/22. Results reflect: Bone density results: OSTEOPENIA. Repeat every   years.  Lung Cancer  Screening: (Low Dose CT Chest recommended if Age 65-80 years, 20 pack-year currently smoking OR have quit w/in 15years.) does not qualify.     Additional Screening:  Hepatitis C Screening: does qualify; Completed 07/18/17  Vision Screening: Recommended annual ophthalmology exams for early detection of glaucoma and other disorders of the eye. Is the patient up to date with their annual eye exam?  Yes  Who is the provider or what is the name of the office in which the patient attends annual eye exams? [facility] If pt is not established with a provider, would they like to be referred to a provider to establish care? No .   Dental Screening: Recommended annual dental exams for proper oral hygiene  Diabetic Foot Exam: Diabetic Foot Exam: Overdue, Pt has been advised about the importance in completing this exam. Pt is scheduled for diabetic foot exam on Followed by PCP.  Community  Resource Referral / Chronic Care Management:  CRR required this visit?  No   CCM required this visit?  No     Plan:     I have personally reviewed and noted the following in the patient's chart:   Medical and social history Use of alcohol, tobacco or illicit drugs  Current medications and supplements including opioid prescriptions. Patient is not currently taking opioid prescriptions. Functional ability and status Nutritional status Physical activity Advanced directives List of other physicians Hospitalizations, surgeries, and ER visits in previous 12 months Vitals Screenings to include cognitive, depression, and falls Referrals and appointments  In addition, I have reviewed and discussed with patient certain preventive protocols, quality metrics, and best practice recommendations. A written personalized care plan for preventive services as well as general preventive health recommendations were provided to patient.     Tillie Rung, LPN   05/21/6961   After Visit Summary: (MyChart) Due to this being a telephonic visit, the after visit summary with patients personalized plan was offered to patient via MyChart   Nurse Notes: None

## 2023-07-06 DIAGNOSIS — K080 Exfoliation of teeth due to systemic causes: Secondary | ICD-10-CM | POA: Diagnosis not present

## 2023-07-08 ENCOUNTER — Other Ambulatory Visit: Payer: Self-pay | Admitting: Student

## 2023-07-08 DIAGNOSIS — E785 Hyperlipidemia, unspecified: Secondary | ICD-10-CM | POA: Diagnosis not present

## 2023-07-09 LAB — LIPID PANEL
Chol/HDL Ratio: 3.2 ratio (ref 0.0–4.4)
Cholesterol, Total: 206 mg/dL — ABNORMAL HIGH (ref 100–199)
HDL: 64 mg/dL (ref >39)
LDL Chol Calc (NIH): 127 mg/dL — ABNORMAL HIGH (ref 0–99)
Triglycerides: 85 mg/dL (ref 0–149)
VLDL Cholesterol Cal: 15 mg/dL (ref 5–40)

## 2023-07-09 LAB — HEPATIC FUNCTION PANEL
ALT: 11 IU/L (ref 0–32)
AST: 11 IU/L (ref 0–40)
Albumin: 4.6 g/dL (ref 3.9–4.9)
Alkaline Phosphatase: 55 IU/L (ref 44–121)
Bilirubin Total: 0.4 mg/dL (ref 0.0–1.2)
Bilirubin, Direct: 0.11 mg/dL (ref 0.00–0.40)
Total Protein: 7.7 g/dL (ref 6.0–8.5)

## 2023-07-09 LAB — SPECIMEN STATUS REPORT

## 2023-07-11 ENCOUNTER — Other Ambulatory Visit: Payer: Self-pay

## 2023-07-11 DIAGNOSIS — E785 Hyperlipidemia, unspecified: Secondary | ICD-10-CM

## 2023-07-11 MED ORDER — ROSUVASTATIN CALCIUM 20 MG PO TABS
20.0000 mg | ORAL_TABLET | Freq: Every day | ORAL | 1 refills | Status: DC
Start: 2023-07-11 — End: 2023-12-29

## 2023-07-29 ENCOUNTER — Ambulatory Visit: Payer: Medicare Other | Admitting: Family Medicine

## 2023-07-29 ENCOUNTER — Encounter: Payer: Self-pay | Admitting: Family Medicine

## 2023-07-29 VITALS — BP 130/84 | HR 62 | Temp 98.7°F | Ht 61.0 in | Wt 151.9 lb

## 2023-07-29 DIAGNOSIS — Z23 Encounter for immunization: Secondary | ICD-10-CM | POA: Diagnosis not present

## 2023-07-29 DIAGNOSIS — I10 Essential (primary) hypertension: Secondary | ICD-10-CM

## 2023-07-29 DIAGNOSIS — E213 Hyperparathyroidism, unspecified: Secondary | ICD-10-CM

## 2023-07-29 DIAGNOSIS — E119 Type 2 diabetes mellitus without complications: Secondary | ICD-10-CM | POA: Diagnosis not present

## 2023-07-29 DIAGNOSIS — E559 Vitamin D deficiency, unspecified: Secondary | ICD-10-CM

## 2023-07-29 DIAGNOSIS — E785 Hyperlipidemia, unspecified: Secondary | ICD-10-CM | POA: Diagnosis not present

## 2023-07-29 DIAGNOSIS — Z7984 Long term (current) use of oral hypoglycemic drugs: Secondary | ICD-10-CM

## 2023-07-29 DIAGNOSIS — E1165 Type 2 diabetes mellitus with hyperglycemia: Secondary | ICD-10-CM

## 2023-07-29 MED ORDER — METFORMIN HCL 500 MG PO TABS: 500.0000 mg | ORAL_TABLET | Freq: Every day | ORAL | 1 refills | Status: DC

## 2023-07-29 MED ORDER — LOSARTAN POTASSIUM 100 MG PO TABS
100.0000 mg | ORAL_TABLET | Freq: Every day | ORAL | 1 refills | Status: DC
Start: 2023-07-29 — End: 2023-11-23

## 2023-07-29 MED ORDER — AMLODIPINE BESYLATE 5 MG PO TABS
5.0000 mg | ORAL_TABLET | Freq: Every day | ORAL | 1 refills | Status: DC
Start: 2023-07-29 — End: 2023-12-26

## 2023-07-29 NOTE — Progress Notes (Signed)
Established Patient Office Visit  Subjective   Patient ID: Rebecca Paul, female    DOB: 02-26-1956  Age: 67 y.o. MRN: [id number]  Chief Complaint  Patient presents with   Medical Management of Chronic Issues    A1c check. Pt would like to discuss with provider about her blood glucose. Would like to discuss Q-10    Pt is here for 6 month follow up today, she reports that she is getting some low blood sugar readings at night for the last few weeks, about 6 lows in the last 30 days. Usually around midnight. Patient reports that she is eating later in the evening, is taking her medication around 8 pm.   Pt reports that her average blood sugar is around 104, is using a freestyle libre 3, states that she only gets a rarely elevated when she has a meal, but then it comes back down.   HTN -- BP in office performed and is well controlled. She  reports no side effects to the medications, no chest pain, SOB, dizziness or headaches. She has a BP cuff at home and is checking BP regularly, reports they are in the normal range.        Current Outpatient Medications  Medication Instructions   amLODipine (NORVASC) 5 mg, Oral, Daily   aspirin EC 81 mg, Oral, Daily, Swallow whole.   Azelastine-Fluticasone 137-50 MCG/ACT SUSP 1 spray, Nasal, Every 12 hours   blood glucose meter kit and supplies KIT Dispense based on patient and insurance preference. Use up to four times daily as directed. (FOR ICD-9 250.00, 250.01).   blood glucose meter kit and supplies Use as directed once a day   Blood Glucose Monitoring Suppl (CONTOUR BLOOD GLUCOSE SYSTEM) w/Device KIT Does not apply   Blood Glucose Monitoring Suppl (CONTOUR NEXT MONITOR) w/Device KIT Use as directed   Cholecalciferol (VITAMIN D3) 1000 units CAPS Oral   co-enzyme Q-10 30 mg, 3 times daily   Continuous Glucose Receiver (FREESTYLE LIBRE 3 READER) DEVI 1 each, Does not apply, As directed   Continuous Glucose Sensor (FREESTYLE LIBRE 3 SENSOR) MISC 1  each, Does not apply, Every 14 days, Place 1 sensor on the skin every 14 days. Use to check glucose continuously   famciclovir (FAMVIR) 250 mg, Oral, 2 times daily PRN   Lancets MISC Use as directed three times a day   losartan (COZAAR) 100 mg, Oral, Daily   metFORMIN (GLUCOPHAGE) 500 mg, Oral, Daily with breakfast   rosuvastatin (CRESTOR) 20 mg, Oral, Daily    Patient Active Problem List   Diagnosis Date Noted   Mild CAD 11/25/2021   Hyperlipidemia 11/25/2021   Osteopenia 08/25/2020   Vitamin D deficiency 11/20/2019   Hyperparathyroidism (HCC) 11/20/2019   Hypercalcemia 11/20/2019   DM II (diabetes mellitus, type II), controlled (HCC) 04/09/2019   Backache 07/06/2010   Essential hypertension 04/22/2009   NEPHROLITHIASIS 10/30/2007      Review of Systems  All other systems reviewed and are negative.     Objective:     BP 130/84 (BP Location: Left Arm, Patient Position: Sitting, Cuff Size: Normal)   Pulse 62   Temp 98.7 F (37.1 C) (Oral)   Ht 5\' 1"  (1.549 m)   Wt 151 lb 14.4 oz (68.9 kg)   SpO2 96%   BMI 28.70 kg/m    Physical Exam Vitals reviewed.  Constitutional:      Appearance: Normal appearance. She is well-groomed and normal weight.  Eyes:  Conjunctiva/sclera: Conjunctivae normal.  Neck:     Thyroid: No thyromegaly.  Cardiovascular:     Rate and Rhythm: Normal rate and regular rhythm.     Pulses: Normal pulses.     Heart sounds: S1 normal and S2 normal.  Pulmonary:     Effort: Pulmonary effort is normal.     Breath sounds: Normal breath sounds and air entry.  Abdominal:     General: Bowel sounds are normal.  Musculoskeletal:     Right lower leg: No edema.     Left lower leg: No edema.  Neurological:     Mental Status: She is alert and oriented to person, place, and time. Mental status is at baseline.     Gait: Gait is intact.  Psychiatric:        Mood and Affect: Mood and affect normal.        Speech: Speech normal.        Behavior:  Behavior normal.        Judgment: Judgment normal.       The 10-year ASCVD risk score (Arnett DK, et al., 2019) is: 22.4%    Assessment & Plan:  Controlled type 2 diabetes mellitus without complication, without long-term current use of insulin (HCC) Assessment & Plan: Needs labs drawn today including A1C, refilled her metformin.   Orders: -     metFORMIN HCl; Take 1 tablet (500 mg total) by mouth daily with breakfast.  Dispense: 90 tablet; Refill: 1 -     Hemoglobin A1c  Influenza vaccine needed -     Flu Vaccine Trivalent High Dose (Fluad)  Essential hypertension Assessment & Plan: Current hypertension medications:       Sig   amLODipine (NORVASC) 5 MG tablet Take 1 tablet (5 mg total) by mouth daily.   losartan (COZAAR) 100 MG tablet Take 1 tablet (100 mg total) by mouth daily.      BP is well controlled on the above medications, will continue as prescribed, refills sent,  Orders: -     Losartan Potassium; Take 1 tablet (100 mg total) by mouth daily.  Dispense: 90 tablet; Refill: 1 -     amLODIPine Besylate; Take 1 tablet (5 mg total) by mouth daily.  Dispense: 90 tablet; Refill: 1 -     Comprehensive metabolic panel  Hyperlipidemia, unspecified hyperlipidemia type Assessment & Plan: On 20 mg daily of crestor, will refill this. I have ordered her labs for the next visit.   Orders: -     Hepatic function panel; Future -     Lipid panel; Future -     Lipid panel; Future -     Hepatic function panel; Future  Vitamin D deficiency Assessment & Plan: Needs new level drawn today. Orders placed   Orders: -     VITAMIN D 25 Hydroxy (Vit-D Deficiency, Fractures)     Return in about 6 months (around 01/27/2024) for DM.    Karie Georges, MD

## 2023-07-30 LAB — COMPREHENSIVE METABOLIC PANEL
AG Ratio: 1.4 (calc) (ref 1.0–2.5)
ALT: 10 U/L (ref 6–29)
AST: 12 U/L (ref 10–35)
Albumin: 4.3 g/dL (ref 3.6–5.1)
Alkaline phosphatase (APISO): 48 U/L (ref 37–153)
BUN: 9 mg/dL (ref 7–25)
CO2: 25 mmol/L (ref 20–32)
Calcium: 9.5 mg/dL (ref 8.6–10.4)
Chloride: 105 mmol/L (ref 98–110)
Creat: 0.77 mg/dL (ref 0.50–1.05)
Globulin: 3.1 g/dL (ref 1.9–3.7)
Glucose, Bld: 94 mg/dL (ref 65–99)
Potassium: 4.2 mmol/L (ref 3.5–5.3)
Sodium: 140 mmol/L (ref 135–146)
Total Bilirubin: 0.4 mg/dL (ref 0.2–1.2)
Total Protein: 7.4 g/dL (ref 6.1–8.1)

## 2023-07-30 LAB — HEMOGLOBIN A1C
Hgb A1c MFr Bld: 6 %{Hb} — ABNORMAL HIGH (ref <5.7)
Mean Plasma Glucose: 126 mg/dL
eAG (mmol/L): 7 mmol/L

## 2023-07-30 LAB — VITAMIN D 25 HYDROXY (VIT D DEFICIENCY, FRACTURES): Vit D, 25-Hydroxy: 30 ng/mL (ref 30–100)

## 2023-08-08 ENCOUNTER — Other Ambulatory Visit: Payer: Self-pay | Admitting: Family Medicine

## 2023-08-08 DIAGNOSIS — E785 Hyperlipidemia, unspecified: Secondary | ICD-10-CM

## 2023-08-10 ENCOUNTER — Telehealth: Payer: Self-pay | Admitting: Family Medicine

## 2023-08-10 ENCOUNTER — Other Ambulatory Visit: Payer: Self-pay | Admitting: Family Medicine

## 2023-08-10 DIAGNOSIS — I10 Essential (primary) hypertension: Secondary | ICD-10-CM

## 2023-08-10 NOTE — Telephone Encounter (Signed)
Please advise patient that I did not order this for her, we just checked her kidney function on 10/11 and it was normal

## 2023-08-10 NOTE — Telephone Encounter (Signed)
Pt received a kidney test kit possibly from [employer], by way of "[employer].  Pt is asking if MD actually ordered this kit for her, or is it a scam?  Please call Pt back to discuss.

## 2023-08-10 NOTE — Telephone Encounter (Signed)
Left detailed message on mobile phone voicemail informing patient of the message below and to call back with any questions.

## 2023-08-15 NOTE — Assessment & Plan Note (Signed)
Current hypertension medications:       Sig   amLODipine (NORVASC) 5 MG tablet Take 1 tablet (5 mg total) by mouth daily.   losartan (COZAAR) 100 MG tablet Take 1 tablet (100 mg total) by mouth daily.      BP is well controlled on the above medications, will continue as prescribed, refills sent,

## 2023-08-15 NOTE — Assessment & Plan Note (Signed)
On 20 mg daily of crestor, will refill this. I have ordered her labs for the next visit.

## 2023-08-15 NOTE — Assessment & Plan Note (Signed)
Needs new level drawn today. Orders placed

## 2023-08-15 NOTE — Assessment & Plan Note (Signed)
Needs labs drawn today including A1C, refilled her metformin.

## 2023-08-19 DIAGNOSIS — E119 Type 2 diabetes mellitus without complications: Secondary | ICD-10-CM | POA: Diagnosis not present

## 2023-08-25 DIAGNOSIS — Z1231 Encounter for screening mammogram for malignant neoplasm of breast: Secondary | ICD-10-CM | POA: Diagnosis not present

## 2023-08-25 DIAGNOSIS — Z124 Encounter for screening for malignant neoplasm of cervix: Secondary | ICD-10-CM | POA: Diagnosis not present

## 2023-08-25 DIAGNOSIS — Z01419 Encounter for gynecological examination (general) (routine) without abnormal findings: Secondary | ICD-10-CM | POA: Diagnosis not present

## 2023-08-25 DIAGNOSIS — Z6829 Body mass index (BMI) 29.0-29.9, adult: Secondary | ICD-10-CM | POA: Diagnosis not present

## 2023-08-29 ENCOUNTER — Telehealth: Payer: Self-pay | Admitting: Family Medicine

## 2023-08-29 DIAGNOSIS — H524 Presbyopia: Secondary | ICD-10-CM | POA: Diagnosis not present

## 2023-08-29 DIAGNOSIS — E1165 Type 2 diabetes mellitus with hyperglycemia: Secondary | ICD-10-CM

## 2023-08-29 NOTE — Telephone Encounter (Signed)
Requesting refill for Continuous Glucose Sensor (FREESTYLE LIBRE 3 PLUS SENSOR) MISC   [facility] [place], [place] - [address] Phone: [phone]  Fax: [phone]

## 2023-08-30 MED ORDER — FREESTYLE LIBRE 3 PLUS SENSOR MISC
11 refills | Status: DC
Start: 2023-08-30 — End: 2024-03-14

## 2023-08-30 NOTE — Telephone Encounter (Signed)
Script sent  

## 2023-08-30 NOTE — Addendum Note (Signed)
Addended by: Karie Georges on: 08/30/2023 08:13 AM   Modules accepted: Orders

## 2023-10-04 ENCOUNTER — Telehealth: Payer: Self-pay | Admitting: Student

## 2023-10-04 NOTE — Telephone Encounter (Signed)
Patient identification verified by 2 forms. Rebecca Rail, RN    Called and spoke to patient  Patient states:   -at night time has weird chest sensation   -unsure if she can describe it as fluttering   -checks pulse in neck, does not feel like heart is skipping a beat   -at times does feel like heart is beating fast (has not check heart rate)   -symptoms mainly occur at night when laying down   -symptoms on going for a few weeks  Patient denies:   -chest pain   -SOB/difficulty   -Palpitations (heart skipping a beat)  Advised patient: -to keep 1/17 follow up appointment  -Continue monitor symptoms at home  Reviewed ED warning signs/precautions  Patient verbalized understanding, no questions at this time

## 2023-10-04 NOTE — Telephone Encounter (Signed)
Patient c/o Palpitations:  STAT if patient reporting lightheadedness, shortness of breath, or chest pain  How long have you had palpitations/irregular HR/ Afib? Are you having the symptoms now?  Fluttering, she says she really can not describe it  Are you currently experiencing lightheadedness, SOB or CP? no  Do you have a history of afib (atrial fibrillation) or irregular heart rhythm?   Have you checked your BP or HR? (document readings if available):  Blood pressure have been high for her the last 2 days  Are you experiencing any other symptoms? Headache off and on for the last 2 days, - patient wanted an appointment- I made her the first available appointment with [facility] - that who patient wanted to see- please call to evaluate

## 2023-10-17 ENCOUNTER — Encounter: Payer: Self-pay | Admitting: Cardiology

## 2023-10-17 ENCOUNTER — Other Ambulatory Visit: Payer: Self-pay | Admitting: Family Medicine

## 2023-10-17 DIAGNOSIS — E119 Type 2 diabetes mellitus without complications: Secondary | ICD-10-CM

## 2023-10-20 ENCOUNTER — Telehealth: Payer: Self-pay

## 2023-10-20 NOTE — Telephone Encounter (Signed)
 Copied from CRM [id number]. Topic: Clinical - Prescription Issue >> Oct 17, 2023 12:47 PM Leotis ORN wrote: Reason for CRM: metFORMIN  (GLUCOPHAGE ) 500 MG tablet Pt needs 2x a day instead of 1 time a day Pt would like updated Rx, the 1 time a day did not work she's been taking 2x a day and almost out of Rx

## 2023-10-20 NOTE — Telephone Encounter (Signed)
Rx previously sent by PCP. °

## 2023-10-26 DIAGNOSIS — R351 Nocturia: Secondary | ICD-10-CM | POA: Diagnosis not present

## 2023-10-26 DIAGNOSIS — N200 Calculus of kidney: Secondary | ICD-10-CM | POA: Diagnosis not present

## 2023-10-26 DIAGNOSIS — N281 Cyst of kidney, acquired: Secondary | ICD-10-CM | POA: Diagnosis not present

## 2023-11-03 ENCOUNTER — Encounter: Payer: Self-pay | Admitting: Family Medicine

## 2023-11-03 ENCOUNTER — Ambulatory Visit (INDEPENDENT_AMBULATORY_CARE_PROVIDER_SITE_OTHER): Payer: Medicare Other | Admitting: Family Medicine

## 2023-11-03 VITALS — BP 146/82 | HR 83 | Temp 98.2°F | Wt 156.0 lb

## 2023-11-03 DIAGNOSIS — E1165 Type 2 diabetes mellitus with hyperglycemia: Secondary | ICD-10-CM | POA: Diagnosis not present

## 2023-11-03 DIAGNOSIS — Z7984 Long term (current) use of oral hypoglycemic drugs: Secondary | ICD-10-CM | POA: Diagnosis not present

## 2023-11-03 DIAGNOSIS — J302 Other seasonal allergic rhinitis: Secondary | ICD-10-CM | POA: Diagnosis not present

## 2023-11-03 DIAGNOSIS — E785 Hyperlipidemia, unspecified: Secondary | ICD-10-CM | POA: Diagnosis not present

## 2023-11-03 MED ORDER — FREESTYLE LIBRE 3 READER DEVI: 1.0000 | 0 refills | Status: DC

## 2023-11-03 MED ORDER — AZELASTINE-FLUTICASONE 137-50 MCG/ACT NA SUSP: 1.0000 | Freq: Two times a day (BID) | NASAL | 5 refills | Status: DC

## 2023-11-03 NOTE — Assessment & Plan Note (Signed)
Sent new rx for her reader for the freestyle libre, pt requesting new A1C today. Orders placed.

## 2023-11-03 NOTE — Assessment & Plan Note (Signed)
On crestor 20 mg daily since Sept 2024, needs new lipid panel and LFT's before seeing her cardiologist later this month, orders placed.

## 2023-11-03 NOTE — Assessment & Plan Note (Signed)
Chronic, Symptoms worsened during this winter, no fever/chills, no facial pain, no ear pain, no signs of acute infection, will refill her nasal spray, continue as prescribed, see below

## 2023-11-03 NOTE — Progress Notes (Signed)
Established Patient Office Visit  Subjective   Patient ID: [name] Paul, female    DOB: 11-03-1955  Age: 68 y.o. MRN: [id number]  Chief Complaint  Patient presents with   Medical Management of Chronic Issues    Pt is here for A1C check. She reports that she feels like her sinuses feel like they are always stopped up. States that the OTC medications are not working and the nasal spray usually helps.   HTN -- pt is concerned about her blood pressure. She reports that her home readings are elevated as well, more recently. She denies any SOB, no chest pain or headaches. She reports that maybe she is eating more salt. Is going to see the cardiologist next month.   HLD-- pt had her rosuvastatin increased to 20 mg daily in Sept, states she was told she needed a new lipid panel and LFT's. She denies any new symptoms, no muscle aches, weakness, no side effects.   DM-- pt requesting A1C today, she reports she is getting variable readings on her freestyle libre 3, would like a new reader for her sensors. Reports that her sugars are usually well controlled.     Current Outpatient Medications  Medication Instructions   amLODipine (NORVASC) 5 mg, Oral, Daily   aspirin EC 81 mg, Oral, Daily, Swallow whole.   Azelastine-Fluticasone 137-50 MCG/ACT SUSP 1 spray, Nasal, Every 12 hours   blood glucose meter kit and supplies KIT Dispense based on patient and insurance preference. Use up to four times daily as directed. (FOR ICD-9 250.00, 250.01).   blood glucose meter kit and supplies Use as directed once a day   Blood Glucose Monitoring Suppl (CONTOUR BLOOD GLUCOSE SYSTEM) w/Device KIT by Does not apply route.   Blood Glucose Monitoring Suppl (CONTOUR NEXT MONITOR) w/Device KIT Use as directed   Cholecalciferol (VITAMIN D3) 1000 units CAPS Take by mouth.   co-enzyme Q-10 30 mg, 3 times daily   Continuous Glucose Receiver (FREESTYLE LIBRE 3 READER) DEVI 1 each, Does not apply, As directed    Continuous Glucose Sensor (FREESTYLE LIBRE 3 PLUS SENSOR) MISC Change sensor every 15 days.   famciclovir (FAMVIR) 250 mg, Oral, 2 times daily PRN   Lancets MISC Use as directed three times a day   losartan (COZAAR) 100 mg, Oral, Daily   metFORMIN (GLUCOPHAGE) 500 MG tablet TAKE 1 TABLET BY MOUTH TWICE DAILY WITH A MEAL.  START WITH 1 TABLET DAILY AND INCREASE TO TWICE  DAILY AS TOLERATED   rosuvastatin (CRESTOR) 20 mg, Oral, Daily    Patient Active Problem List   Diagnosis Date Noted   Seasonal allergies 11/03/2023   Mild CAD 11/25/2021   Hyperlipidemia 11/25/2021   Osteopenia 08/25/2020   Vitamin D deficiency 11/20/2019   Hyperparathyroidism (HCC) 11/20/2019   Hypercalcemia 11/20/2019   DM II (diabetes mellitus, type II), controlled (HCC) 04/09/2019   Backache 07/06/2010   Essential hypertension 04/22/2009   NEPHROLITHIASIS 10/30/2007      Review of Systems  All other systems reviewed and are negative.     Objective:     BP (!) 146/82   Pulse 83   Temp 98.2 F (36.8 C) (Oral)   Wt 156 lb (70.8 kg)   SpO2 96%   BMI 29.48 kg/m    Physical Exam Vitals reviewed.  Constitutional:      Appearance: Normal appearance. She is normal weight.  Cardiovascular:     Rate and Rhythm: Normal rate and regular rhythm.  Heart sounds: Normal heart sounds. No murmur heard. Pulmonary:     Effort: Pulmonary effort is normal.     Breath sounds: Normal breath sounds. No wheezing.  Musculoskeletal:     Right lower leg: No edema.     Left lower leg: No edema.  Neurological:     Mental Status: She is alert and oriented to person, place, and time. Mental status is at baseline.  Psychiatric:        Mood and Affect: Mood normal.        Behavior: Behavior normal.      No results found for any visits on 11/03/23.    The 10-year ASCVD risk score (Arnett DK, et al., 2019) is: 28.3%    Assessment & Plan:  Controlled type 2 diabetes mellitus with hyperglycemia, without  long-term current use of insulin (HCC) Assessment & Plan: Sent new rx for her reader for the freestyle libre, pt requesting new A1C today. Orders placed.   Orders: -     FreeStyle Libre 3 Reader; 1 each by Does not apply route as directed.  Dispense: 1 each; Refill: 0 -     Hemoglobin A1c  Seasonal allergies Assessment & Plan: Chronic, Symptoms worsened during this winter, no fever/chills, no facial pain, no ear pain, no signs of acute infection, will refill her nasal spray, continue as prescribed, see below  Orders: -     Azelastine-Fluticasone; Place 1 spray into the nose every 12 (twelve) hours.  Dispense: 23 g; Refill: 5  Hyperlipidemia, unspecified hyperlipidemia type Assessment & Plan: On crestor 20 mg daily since Sept 2024, needs new lipid panel and LFT's before seeing her cardiologist later this month, orders placed.   Orders: -     Lipid panel -     Hepatic function panel    Pt has follow up with me in April for her annual physical. No follow-ups on file.    Karie Georges, MD

## 2023-11-04 ENCOUNTER — Ambulatory Visit: Payer: Medicare Other | Admitting: Student

## 2023-11-04 LAB — HEPATIC FUNCTION PANEL
ALT: 11 U/L (ref 0–35)
AST: 13 U/L (ref 0–37)
Albumin: 4.8 g/dL (ref 3.5–5.2)
Alkaline Phosphatase: 56 U/L (ref 39–117)
Bilirubin, Direct: 0.1 mg/dL (ref 0.0–0.3)
Total Bilirubin: 0.5 mg/dL (ref 0.2–1.2)
Total Protein: 8.3 g/dL (ref 6.0–8.3)

## 2023-11-04 LAB — HEMOGLOBIN A1C: Hgb A1c MFr Bld: 6.6 % — ABNORMAL HIGH (ref 4.6–6.5)

## 2023-11-04 LAB — LIPID PANEL
Cholesterol: 200 mg/dL (ref 0–200)
HDL: 63.2 mg/dL (ref >39.00)
LDL Cholesterol: 114 mg/dL — ABNORMAL HIGH (ref 0–99)
NonHDL: 137.05
Total CHOL/HDL Ratio: 3
Triglycerides: 116 mg/dL (ref 0.0–149.0)
VLDL: 23.2 mg/dL (ref 0.0–40.0)

## 2023-11-07 ENCOUNTER — Telehealth: Payer: Self-pay

## 2023-11-07 ENCOUNTER — Other Ambulatory Visit (HOSPITAL_COMMUNITY): Payer: Self-pay

## 2023-11-07 NOTE — Telephone Encounter (Signed)
*  Primary  Pharmacy Patient Advocate Encounter   Received notification from Fax that prior authorization for Azelastine-Fluticasone 137-50MCG/ACT suspension  is required/requested.   Insurance verification completed.   The patient is insured through [facility] .   Per test claim: PA required; PA submitted to above mentioned insurance via CoverMyMeds Key/confirmation #/EOC [id number] Status is pending

## 2023-11-09 ENCOUNTER — Encounter: Payer: Self-pay | Admitting: Family Medicine

## 2023-11-09 DIAGNOSIS — K080 Exfoliation of teeth due to systemic causes: Secondary | ICD-10-CM | POA: Diagnosis not present

## 2023-11-11 ENCOUNTER — Other Ambulatory Visit: Payer: Self-pay | Admitting: Family Medicine

## 2023-11-11 ENCOUNTER — Other Ambulatory Visit: Payer: Self-pay

## 2023-11-11 DIAGNOSIS — Z79899 Other long term (current) drug therapy: Secondary | ICD-10-CM

## 2023-11-11 DIAGNOSIS — E785 Hyperlipidemia, unspecified: Secondary | ICD-10-CM

## 2023-11-11 DIAGNOSIS — J302 Other seasonal allergic rhinitis: Secondary | ICD-10-CM

## 2023-11-11 MED ORDER — FLUTICASONE PROPIONATE 50 MCG/ACT NA SUSP: 2.0000 | Freq: Every day | NASAL | 6 refills | Status: AC

## 2023-11-11 NOTE — Telephone Encounter (Signed)
Left a message for the patient to return my call.

## 2023-11-11 NOTE — Telephone Encounter (Signed)
Pharmacy Patient Advocate Encounter  Received notification from [facility] that Prior Authorization for Azelastine-Fluticasone 137-70mcg/act susp has been DENIED.  Full denial letter will be uploaded to the media tab. See denial reason below.   PA #/Case ID/Reference #:

## 2023-11-11 NOTE — Telephone Encounter (Signed)
Please ask pt if she would like an alternative sent to the pharmacy. Thanks!

## 2023-11-15 ENCOUNTER — Other Ambulatory Visit: Payer: Self-pay | Admitting: Family Medicine

## 2023-11-16 ENCOUNTER — Telehealth: Payer: Self-pay | Admitting: Family Medicine

## 2023-11-16 NOTE — Telephone Encounter (Signed)
Copied from CRM [id number]. Topic: General - Other >> Nov 15, 2023 12:03 PM Pascal Lux wrote: Reason for CRM: Patient called requesting a call back from [facility]. Stated she would prefer if you leave a message if you aren't able to reach her.

## 2023-11-16 NOTE — Telephone Encounter (Signed)
Please see previous encounter

## 2023-11-16 NOTE — Telephone Encounter (Signed)
Left detailed message on patient voicemail informing her of denial and to contact our office if she would like alternative sent

## 2023-11-22 NOTE — Progress Notes (Addendum)
 Dr. Sheena and Rolin,  Rebecca Paul has been identified as a patient that could benefit from health coaching for healthy eating and physical activity or physical activity. Discuss with patient their interest in participating in the free health coaching program and refer to REF 2201/Care Navigation.    Greig Ruth, MS, ERHD, [facility]   Care Guide, Health & Wellness Coach [address], [address] [place] [address] Telephone: [phone] Email: [email or web address]

## 2023-11-23 ENCOUNTER — Encounter: Payer: Self-pay | Admitting: Cardiology

## 2023-11-23 ENCOUNTER — Ambulatory Visit: Payer: Medicare Other | Attending: Cardiology | Admitting: Cardiology

## 2023-11-23 VITALS — BP 152/84 | HR 71 | Ht 61.0 in | Wt 156.2 lb

## 2023-11-23 DIAGNOSIS — E0800 Diabetes mellitus due to underlying condition with hyperosmolarity without nonketotic hyperglycemic-hyperosmolar coma (NKHHC): Secondary | ICD-10-CM | POA: Diagnosis not present

## 2023-11-23 DIAGNOSIS — I10 Essential (primary) hypertension: Secondary | ICD-10-CM

## 2023-11-23 DIAGNOSIS — Z794 Long term (current) use of insulin: Secondary | ICD-10-CM | POA: Diagnosis not present

## 2023-11-23 DIAGNOSIS — E782 Mixed hyperlipidemia: Secondary | ICD-10-CM | POA: Diagnosis not present

## 2023-11-23 MED ORDER — EZETIMIBE 10 MG PO TABS: 10.0000 mg | ORAL_TABLET | Freq: Every day | ORAL | 3 refills | Status: DC

## 2023-11-23 MED ORDER — VALSARTAN 80 MG PO TABS: 80.0000 mg | ORAL_TABLET | Freq: Every day | ORAL | 3 refills | Status: DC

## 2023-11-23 NOTE — Patient Instructions (Addendum)
 Medication Instructions:  Your physician has recommended you make the following change in your medication:  STOP: Losartan   START: Zetia  10 mg once daily START: Valsartan  80 mg once daily  Please take your blood pressure daily for 2 weeks and send in a MyChart message. Please include heart rates. (One message at the end of the 2 weeks).   HOW TO TAKE YOUR BLOOD PRESSURE: Rest 5 minutes before taking your blood pressure. Don't smoke or drink caffeinated beverages for at least 30 minutes before. Take your blood pressure before (not after) you eat. Sit comfortably with your back supported and both feet on the floor (don't cross your legs). Elevate your arm to heart level on a table or a desk. Use the proper sized cuff. It should fit smoothly and snugly around your bare upper arm. There should be enough room to slip a fingertip under the cuff. The bottom edge of the cuff should be 1 inch above the crease of the elbow. Ideally, take 3 measurements at one sitting and record the average.  *If you need a refill on your cardiac medications before your next appointment, please call your pharmacy*  Follow-Up: At [facility], you and your health needs are our priority.  As part of our continuing mission to provide you with exceptional heart care, we have created designated Provider Care Teams.  These Care Teams include your primary Cardiologist (physician) and Advanced Practice Providers (APPs -  Physician Assistants and Nurse Practitioners) who all work together to provide you with the care you need, when you need it.  Your next appointment:   16 week(s)  Provider:   Kardie Tobb, DO     Other Instructions: Please see our pharmacy staff in 4 weeks.

## 2023-11-23 NOTE — Progress Notes (Signed)
 Cardiology Office Note:    Date:  11/23/2023   ID:  [name], DOB 04/09/1956, MRN [id number]  PCP:  Ozell Heron HERO, MD  Cardiologist:  Dub Huntsman, DO  Electrophysiologist:  None   Referring MD: Ozell Heron HERO, MD    I am doing ok  History of Present Illness:    Rebecca Paul is a 68 y.o. female with a hx of mild coronary artery disease seen on recent coronary CTA, hyperlipidemia, hypertension, hyperparathyroidism pending surgical intervention at [facility], type 2 diabetes here today for follow-up visit.  Since her last visit with me she has followed in our office with Aline Door, PA, during that visit she was not experiencing any anginal symptoms.  They discussed her LDL goal and her Crestor  was increased to 20 mg after her labs.  She is here today for follow-up visit.  She describes some symptoms which seems to likely be neuropathy.  She also mentions that her blood pressure readings have been slightly elevated recently, which is unusual for her. She denies experiencing any side effects from her current medications, but she does report occasional sharp pains in her hands and feet, which she believes may be neuropathy. She also mentions feeling her heartbeat when lying down at night, but does not describe any irregularities or discomfort associated with this sensation.   Past Medical History:  Diagnosis Date   Allergy    more sinus issues    Anemia    yrs past as child , with pregnancy    Arthritis    shoulders - not diagnosed    Headache(784.0)    Hyperlipidemia    Hyperparathyroidism (HCC) 07/28/2021   Hypertension    Kidney stone    Non-obstructive CAD    a. coronary CTA in 09/2021 showed a coronary calcium  score of 92 (86th percentile for age and sex) and mild non-obstructive CAD   Osteopenia    Plantar fasciitis    Type 2 diabetes mellitus (HCC)     Past Surgical History:  Procedure Laterality Date   ABDOMINAL HYSTERECTOMY      fibroids   CESAREAN SECTION     COLONOSCOPY  01/2018   normal   OOPHORECTOMY     dermoid tumor   TONSILLECTOMY AND ADENOIDECTOMY      Current Medications: Current Meds  Medication Sig   amLODipine  (NORVASC ) 5 MG tablet Take 1 tablet (5 mg total) by mouth daily.   aspirin  EC 81 MG tablet Take 1 tablet (81 mg total) by mouth daily. Swallow whole.   blood glucose meter kit and supplies KIT Dispense based on patient and insurance preference. Use up to four times daily as directed. (FOR ICD-9 250.00, 250.01).   blood glucose meter kit and supplies Use as directed once a day   Blood Glucose Monitoring Suppl (CONTOUR BLOOD GLUCOSE SYSTEM) w/Device KIT by Does not apply route.   Blood Glucose Monitoring Suppl (CONTOUR NEXT MONITOR) w/Device KIT Use as directed   Cholecalciferol (VITAMIN D3) 1000 units CAPS Take by mouth.   Continuous Glucose Receiver (FREESTYLE LIBRE 3 READER) DEVI 1 each by Does not apply route as directed.   Continuous Glucose Sensor (FREESTYLE LIBRE 3 PLUS SENSOR) MISC Change sensor every 15 days.   ezetimibe  (ZETIA ) 10 MG tablet Take 1 tablet (10 mg total) by mouth daily.   famciclovir  (FAMVIR ) 250 MG tablet Take 1 tablet (250 mg total) by mouth 2 (two) times daily as needed.   fluticasone  (FLONASE ) 50 MCG/ACT nasal  spray Place 2 sprays into both nostrils daily.   glucose blood (CONTOUR NEXT TEST) test strip USE AS INSTRUCTED THREE TIMES DAILY   metFORMIN  (GLUCOPHAGE ) 500 MG tablet TAKE 1 TABLET BY MOUTH TWICE DAILY WITH A MEAL.  START WITH 1 TABLET DAILY AND INCREASE TO TWICE  DAILY AS TOLERATED   Microlet Lancets MISC USE AS DIRECTED THREE TIMES DAILY   rosuvastatin  (CRESTOR ) 20 MG tablet Take 1 tablet (20 mg total) by mouth daily.   valsartan  (DIOVAN ) 80 MG tablet Take 1 tablet (80 mg total) by mouth daily.   [DISCONTINUED] losartan  (COZAAR ) 100 MG tablet Take 1 tablet (100 mg total) by mouth daily.     Allergies:   Dust mite extract   Social History    Socioeconomic History   Marital status: Widowed    Spouse name: Not on file   Number of children: Not on file   Years of education: Not on file   Highest education level: Bachelor's degree (e.g., BA, AB, BS)  Occupational History   Not on file  Tobacco Use   Smoking status: Never   Smokeless tobacco: Never  Vaping Use   Vaping status: Never Used  Substance and Sexual Activity   Alcohol use: Yes    Comment: rare glass of wine    Drug use: No   Sexual activity: Not Currently  Other Topics Concern   Not on file  Social History Narrative   Not on file   Social Drivers of Health   Financial Resource Strain: Low Risk  (06/22/2023)   Overall Financial Resource Strain (CARDIA)    Difficulty of Paying Living Expenses: Not hard at all  Food Insecurity: No Food Insecurity (06/22/2023)   Hunger Vital Sign    Worried About Running Out of Food in the Last Year: Never true    Ran Out of Food in the Last Year: Never true  Transportation Needs: No Transportation Needs (06/22/2023)   PRAPARE - Administrator, Civil Service (Medical): No    Lack of Transportation (Non-Medical): No  Physical Activity: Sufficiently Active (06/22/2023)   Exercise Vital Sign    Days of Exercise per Week: 5 days    Minutes of Exercise per Session: 30 min  Stress: No Stress Concern Present (06/22/2023)   [employer] of Occupational Health - Occupational Stress Questionnaire    Feeling of Stress : Not at all  Social Connections: Moderately Integrated (06/22/2023)   Social Connection and Isolation Panel [NHANES]    Frequency of Communication with Friends and Family: More than three times a week    Frequency of Social Gatherings with Friends and Family: More than three times a week    Attends Religious Services: More than 4 times per year    Active Member of [employer] or Organizations: Yes    Attends Banker Meetings: More than 4 times per year    Marital Status: Widowed     Family  History: The patient's family history includes Cancer in her father and another family member; Diabetes in her maternal grandmother, mother, sister, and another family member; Heart attack in her sister; Hyperlipidemia in her mother and another family member; Hypertension in her mother and another family member; Kidney disease in an other family member; Obesity in her sister and sister; Thyroid  disease in her mother. There is no history of Colon cancer, Colon polyps, Rectal cancer, or Stomach cancer.  ROS:   Review of Systems  Constitution: Negative for decreased appetite, fever and  weight gain.  HENT: Negative for congestion, ear discharge, hoarse voice and sore throat.   Eyes: Negative for discharge, redness, vision loss in right eye and visual halos.  Cardiovascular: Negative for chest pain, dyspnea on exertion, leg swelling, orthopnea and palpitations.  Respiratory: Negative for cough, hemoptysis, shortness of breath and snoring.   Endocrine: Negative for heat intolerance and polyphagia.  Hematologic/Lymphatic: Negative for bleeding problem. Does not bruise/bleed easily.  Skin: Negative for flushing, nail changes, rash and suspicious lesions.  Musculoskeletal: Negative for arthritis, joint pain, muscle cramps, myalgias, neck pain and stiffness.  Gastrointestinal: Negative for abdominal pain, bowel incontinence, diarrhea and excessive appetite.  Genitourinary: Negative for decreased libido, genital sores and incomplete emptying.  Neurological: Negative for brief paralysis, focal weakness, headaches and loss of balance.  Psychiatric/Behavioral: Negative for altered mental status, depression and suicidal ideas.  Allergic/Immunologic: Negative for HIV exposure and persistent infections.    EKGs/Labs/Other Studies Reviewed:    The following studies were reviewed today:   EKG: Sinus rhythm, heart rate 71 bpm. With possible inferior wall infarction   CCTA 09/29/2021 FINDINGS: Coronary  calcium  score: The patient's coronary artery calcium  score is 92, which places the patient in the 86 percentile.   Coronary arteries: Normal coronary origins.  Co- dominance.   Right Coronary Artery: Normal caliber vessel. Predominantly noncalcified plaque in the proximal to mid RCA with 25-49% stenosis.   Left Main Coronary Artery: Normal caliber vessel. No significant plaque or stenosis.   Left Anterior Descending Coronary Artery: Normal caliber vessel. Trivial focal calcified plaque in proximal and mid LAD with 1-24% stenosis. Gives rise to small first diagonal branch.   Left Circumflex Artery: Normal caliber vessel. Noncalcified plaque in proximal LCx near left atrial appendage with 25-49% stenosis. Mixed calcified and noncalcified plaque in the mid LCx with 25-49% stenosis. Gives rise to large branching first and normal second and third OM branches.   Aorta: Normal size, 27 mm at the mid ascending aorta (level of the PA bifurcation) measured double oblique. No calcifications. No dissection seen in visualized portions of the aorta.   Aortic Valve: No calcifications. Trileaflet.   Other findings:   Normal pulmonary vein drainage into the left atrium.   Normal left atrial appendage without a thrombus.   Normal size of the pulmonary artery.   Normal appearance of the pericardium.   IMPRESSION: 1.  Mild non-obstructive CAD, CADRADS = 2.   2. Coronary calcium  score of 92. This was 86th percentile for age and sex matched control.   3. Normal coronary origin with co- dominance.   Moderate signal to noise artifact noted.   INTERPRETATION:   1. CAD-RADS 0: No evidence of CAD (0%). Consider non-atherosclerotic causes of chest pain.   2. CAD-RADS 1: Minimal non-obstructive CAD (0-24%). Consider non-atherosclerotic causes of chest pain. Consider preventive therapy and risk factor modification.   3. CAD-RADS 2: Mild non-obstructive CAD (25-49%).  Consider non-atherosclerotic causes of chest pain. Consider preventive therapy and risk factor modification.   4. CAD-RADS 3: Moderate stenosis (50-69%). Consider symptom-guided anti-ischemic pharmacotherapy as well as risk factor modification per guideline directed care. Additional analysis with CT FFR will be submitted.   5. CAD-RADS 4: Severe stenosis. (70-99% or > 50% left main). Cardiac catheterization or CT FFR is recommended. Consider symptom-guided anti-ischemic pharmacotherapy as well as risk factor modification per guideline directed care. Invasive coronary angiography recommended with revascularization per published guideline statements.   6. CAD-RADS 5: Total coronary occlusion (100%). Consider cardiac catheterization or viability  assessment. Consider symptom-guided anti-ischemic pharmacotherapy as well as risk factor modification per guideline directed care.   7. CAD-RADS N: Non-diagnostic study. Obstructive CAD can't be excluded. Alternative evaluation is recommended.     Electronically Signed   By: Shelda Bruckner M.D.   On: 09/29/2021 22:15    Addended by Bruckner Shelda, MD on 09/29/2021 10:17 PM   Study Result  Narrative & Impression  EXAM: OVER-READ INTERPRETATION  CT CHEST   The following report is an over-read performed by radiologist Dr. Rockey Kilts of [facility] on 09/29/2021. This over-read does not include interpretation of cardiac or coronary anatomy or pathology. The coronary CTA interpretation by the cardiologist is attached.   COMPARISON:  12/22/2011 chest radiograph   FINDINGS: Vascular: Tortuous thoracic aorta. Normal aortic caliber. No central pulmonary embolism, on this non-dedicated study.   Mediastinum/Nodes: No imaged thoracic adenopathy.   Lungs/Pleura: No pleural fluid.  Clear imaged lungs.   Upper Abdomen: Normal imaged portions of the liver, spleen, stomach.   Musculoskeletal: No acute osseous  abnormality.   IMPRESSION: No acute findings in the imaged extracardiac chest.   Electronically Signed: By: Rockey Kilts M.D. On: 09/29/2021 15:12     Recent Labs: 01/27/2023: TSH 1.07 07/29/2023: BUN 9; Creat 0.77; Potassium 4.2; Sodium 140 11/03/2023: ALT 11  Recent Lipid Panel    Component Value Date/Time   CHOL 200 11/03/2023 1454   CHOL 206 (H) 07/08/2023 0000   TRIG 116.0 11/03/2023 1454   TRIG 95 09/26/2006 1002   HDL 63.20 11/03/2023 1454   HDL 64 07/08/2023 0000   CHOLHDL 3 11/03/2023 1454   VLDL 23.2 11/03/2023 1454   LDLCALC 114 (H) 11/03/2023 1454   LDLCALC 127 (H) 07/08/2023 0000   LDLCALC 116 (H) 08/25/2020 1158   LDLDIRECT 140.3 12/04/2012 1039    Physical Exam:    VS:  BP (!) 152/84   Pulse 71   Ht 5' 1 (1.549 m)   Wt 156 lb 3.2 oz (70.9 kg)   SpO2 94%   BMI 29.51 kg/m     Wt Readings from Last 3 Encounters:  11/23/23 156 lb 3.2 oz (70.9 kg)  11/03/23 156 lb (70.8 kg)  07/29/23 151 lb 14.4 oz (68.9 kg)     GEN: Well nourished, well developed in no acute distress HEENT: Normal NECK: No JVD; No carotid bruits LYMPHATICS: No lymphadenopathy CARDIAC: S1S2 noted,RRR, no murmurs, rubs, gallops RESPIRATORY:  Clear to auscultation without rales, wheezing or rhonchi  ABDOMEN: Soft, non-tender, non-distended, +bowel sounds, no guarding. EXTREMITIES: No edema, No cyanosis, no clubbing MUSCULOSKELETAL:  No deformity  SKIN: Warm and dry NEUROLOGIC:  Alert and oriented x 3, non-focal PSYCHIATRIC:  Normal affect, good insight  ASSESSMENT:    1. Primary hypertension   2. Mixed hyperlipidemia   3. Diabetes mellitus due to underlying condition with hyperosmolarity without coma, with long-term current use of insulin (HCC)    PLAN:    Hyperlipidemia LDL 114, despite Crestor  20mg . Discussed the need for additional lipid-lowering therapy due to the presence of coronary artery disease and diabetes, aiming for LDL <55. -Add Zetia  to current  regimen. -Recheck lipid panel in 4 weeks.  Hypertension Recent home readings have been elevated. Currently on Losartan  and Amlodipine . -Replace Losartan  with Valsartan  80mg  daily. -Continue Amlodipine  5mg  daily. -Check blood pressure at home and send readings in 2 weeks. -Return in 4 weeks for blood pressure check and medication tolerance assessment.  Diabetes A1c 6.6, well-controlled. -Continue current management.   Follow-up  in 16 weeks for overall assessment.     The patient is in agreement with the above plan. The patient left the office in stable condition.  The patient will follow up in   Medication Adjustments/Labs and Tests Ordered: Current medicines are reviewed at length with the patient today.  Concerns regarding medicines are outlined above.  Orders Placed This Encounter  Procedures   AMB Referral to [facility]   EKG 12-Lead   Meds ordered this encounter  Medications   ezetimibe  (ZETIA ) 10 MG tablet    Sig: Take 1 tablet (10 mg total) by mouth daily.    Dispense:  90 tablet    Refill:  3   valsartan  (DIOVAN ) 80 MG tablet    Sig: Take 1 tablet (80 mg total) by mouth daily.    Dispense:  90 tablet    Refill:  3    Patient Instructions  Medication Instructions:  Your physician has recommended you make the following change in your medication:  STOP: Losartan   START: Zetia  10 mg once daily START: Valsartan  80 mg once daily  Please take your blood pressure daily for 2 weeks and send in a MyChart message. Please include heart rates. (One message at the end of the 2 weeks).   HOW TO TAKE YOUR BLOOD PRESSURE: Rest 5 minutes before taking your blood pressure. Don't smoke or drink caffeinated beverages for at least 30 minutes before. Take your blood pressure before (not after) you eat. Sit comfortably with your back supported and both feet on the floor (don't cross your legs). Elevate your arm to heart level on a table or a desk. Use the proper sized  cuff. It should fit smoothly and snugly around your bare upper arm. There should be enough room to slip a fingertip under the cuff. The bottom edge of the cuff should be 1 inch above the crease of the elbow. Ideally, take 3 measurements at one sitting and record the average.  *If you need a refill on your cardiac medications before your next appointment, please call your pharmacy*  Follow-Up: At [facility], you and your health needs are our priority.  As part of our continuing mission to provide you with exceptional heart care, we have created designated Provider Care Teams.  These Care Teams include your primary Cardiologist (physician) and Advanced Practice Providers (APPs -  Physician Assistants and Nurse Practitioners) who all work together to provide you with the care you need, when you need it.  Your next appointment:   16 week(s)  Provider:   Abhay Godbolt, DO     Other Instructions: Please see our pharmacy staff in 4 weeks.      Adopting a Healthy Lifestyle.  Know what a healthy weight is for you (roughly BMI <25) and aim to maintain this   Aim for 7+ servings of fruits and vegetables daily   65-80+ fluid ounces of water or unsweet tea for healthy kidneys   Limit to max 1 drink of alcohol per day; avoid smoking/tobacco   Limit animal fats in diet for cholesterol and heart health - choose grass fed whenever available   Avoid highly processed foods, and foods high in saturated/trans fats   Aim for low stress - take time to unwind and care for your mental health   Aim for 150 min of moderate intensity exercise weekly for heart health, and weights twice weekly for bone health   Aim for 7-9 hours of sleep daily   When it  comes to diets, agreement about the perfect plan isnt easy to find, even among the experts. Experts at the [facility] of [employer] developed an idea known as the Healthy Eating Plate. Just imagine a plate divided into logical, healthy  portions.   The emphasis is on diet quality:   Load up on vegetables and fruits - one-half of your plate: Aim for color and variety, and remember that potatoes dont count.   Go for whole grains - one-quarter of your plate: Whole wheat, barley, wheat berries, quinoa, oats, brown rice, and foods made with them. If you want pasta, go with whole wheat pasta.   Protein power - one-quarter of your plate: Fish, chicken, beans, and nuts are all healthy, versatile protein sources. Limit red meat.   The diet, however, does go beyond the plate, offering a few other suggestions.   Use healthy plant oils, such as olive, canola, soy, corn, sunflower and peanut. Check the labels, and avoid partially hydrogenated oil, which have unhealthy trans fats.   If youre thirsty, drink water. Coffee and tea are good in moderation, but skip sugary drinks and limit milk and dairy products to one or two daily servings.   The type of carbohydrate in the diet is more important than the amount. Some sources of carbohydrates, such as vegetables, fruits, whole grains, and beans-are healthier than others.   Finally, stay active  Signed, Juno Bozard, DO  11/23/2023 10:43 AM    [facility]

## 2023-12-14 DIAGNOSIS — H524 Presbyopia: Secondary | ICD-10-CM | POA: Diagnosis not present

## 2023-12-14 DIAGNOSIS — E119 Type 2 diabetes mellitus without complications: Secondary | ICD-10-CM | POA: Diagnosis not present

## 2023-12-24 ENCOUNTER — Other Ambulatory Visit: Payer: Self-pay | Admitting: Family Medicine

## 2023-12-24 DIAGNOSIS — I10 Essential (primary) hypertension: Secondary | ICD-10-CM

## 2023-12-26 ENCOUNTER — Telehealth: Payer: Self-pay

## 2023-12-26 ENCOUNTER — Telehealth: Payer: Self-pay | Admitting: Family Medicine

## 2023-12-26 NOTE — Telephone Encounter (Signed)
 Copied from CRM [id number]. Topic: Clinical - Medical Advice >> Dec 26, 2023 12:34 PM [place] wrote: Reason for CRM: Patient wanting to get her immunization list to make sure she is up to date on her shots.

## 2023-12-26 NOTE — Telephone Encounter (Signed)
 Copied from CRM [id number]. Topic: Referral - Request for Referral >> Dec 26, 2023  3:26 PM [place] J wrote: Did the patient discuss referral with their provider in the last year? No (If No - schedule appointment) (If Yes - send message)  Appointment offered? No  Type of order/referral and detailed reason for visit: Patient has a rash that urgently needs to be looked at. Patient was not satisfied with previous dermatologist and referral is required for this specific provider she wants.  Preference of office, provider, location:  Langston Reusing, DO / [facility] [address], [place], [place] [address]  If referral order, have you been seen by this specialty before? No. (If Yes, this issue or another issue? When? Where?  Can we respond through MyChart? No Would like a call back on phone number: [phone]

## 2023-12-27 ENCOUNTER — Ambulatory Visit (INDEPENDENT_AMBULATORY_CARE_PROVIDER_SITE_OTHER): Admitting: Family Medicine

## 2023-12-27 ENCOUNTER — Encounter: Payer: Self-pay | Admitting: Family Medicine

## 2023-12-27 VITALS — BP 152/100 | HR 93 | Temp 98.4°F | Ht 61.0 in | Wt 154.1 lb

## 2023-12-27 DIAGNOSIS — R21 Rash and other nonspecific skin eruption: Secondary | ICD-10-CM | POA: Diagnosis not present

## 2023-12-27 DIAGNOSIS — Z7984 Long term (current) use of oral hypoglycemic drugs: Secondary | ICD-10-CM | POA: Diagnosis not present

## 2023-12-27 DIAGNOSIS — E1165 Type 2 diabetes mellitus with hyperglycemia: Secondary | ICD-10-CM | POA: Diagnosis not present

## 2023-12-27 LAB — GLUCOSE, POCT (MANUAL RESULT ENTRY): POC Glucose: 89 mg/dL (ref 70–99)

## 2023-12-27 MED ORDER — TRIAMCINOLONE ACETONIDE 0.1 % EX CREA: 1.0000 | TOPICAL_CREAM | Freq: Two times a day (BID) | CUTANEOUS | 1 refills | Status: AC

## 2023-12-27 NOTE — Telephone Encounter (Signed)
 Noted.

## 2023-12-27 NOTE — Telephone Encounter (Signed)
 Spoke with patient in visit today about this, ok to close

## 2023-12-27 NOTE — Telephone Encounter (Signed)
 Spoke with the patient and informed her a visit is needed for evaluation prior to placing a referral since she has not been seen here for the rash. Patient stated the area "is not a rash, its itchy". Appt was scheduled with PCP today at 1pm.

## 2023-12-27 NOTE — Progress Notes (Signed)
   Acute Office Visit  Subjective:     Patient ID: Rebecca Paul, female    DOB: Mar 02, 1956, 68 y.o.   MRN: [id number]  Chief Complaint  Patient presents with   Patient complains of itchy area bilateral LE x2 weeks    Tried Cerave, Hydrocortisone cream, Benadryl and oatmeal baths, questioned if related to hot tub on cruise, requests referral to a dermatologist   Immunizations    Requests information as to being up to date for all vaccines also    HPI Patient is in today for 2 week history of an itchy area on the legs. States that she went on a cruise and spent some time in a hot tub and the itching started shortly after that. Has tried various creams and oatmeal baths and it seems to be getting some better but wanted it looked at to make sure it is improving.   Review of Systems  All other systems reviewed and are negative.       Objective:    BP (!) 152/100   Pulse 93   Temp 98.4 F (36.9 C) (Oral)   Ht 5\' 1"  (1.549 m)   Wt 154 lb 1.6 oz (69.9 kg)   SpO2 98%   BMI 29.12 kg/m    Physical Exam Vitals reviewed.  Constitutional:      Appearance: Normal appearance.  Skin:    General: Skin is warm and dry.     Comments: Skin on the legs overall is normal-- there is no rash, no redness, there is some mild skin peeling on the tops of the feet and there is dryness/flakiness of the skin on the legs diffusely.  Neurological:     Mental Status: She is alert.     Results for orders placed or performed in visit on 12/27/23  POCT glucose (manual entry)  Result Value Ref Range   POC Glucose 89 70 - 99 mg/dl        Assessment & Plan:   Problem List Items Addressed This Visit       Unprioritized   DM II (diabetes mellitus, type II), controlled (HCC)   Relevant Orders   POCT glucose (manual entry) (Completed)   Collection capillary blood specimen   Other Visit Diagnoses       Skin rash    -  Primary   Relevant Medications   triamcinolone cream (KENALOG) 0.1 %    Other Relevant Orders   Ambulatory referral to Dermatology     Pt is requesting a new referral to Dr. Onalee Hua in Dermatology. I will give her a stronger cream to use for the itching. Most like the itching is due to dry skin.   Pt also requested a POC glucose test today in office which was WNL.   Meds ordered this encounter  Medications   triamcinolone cream (KENALOG) 0.1 %    Sig: Apply 1 Application topically 2 (two) times daily.    Dispense:  45 g    Refill:  1    No follow-ups on file.  Karie Georges, MD

## 2023-12-28 ENCOUNTER — Other Ambulatory Visit: Payer: Self-pay | Admitting: Student

## 2023-12-28 DIAGNOSIS — E785 Hyperlipidemia, unspecified: Secondary | ICD-10-CM

## 2024-01-04 ENCOUNTER — Ambulatory Visit: Payer: Medicare Other

## 2024-01-24 ENCOUNTER — Other Ambulatory Visit: Payer: Self-pay | Admitting: Family Medicine

## 2024-01-24 ENCOUNTER — Ambulatory Visit: Attending: Cardiology | Admitting: Pharmacist

## 2024-01-24 ENCOUNTER — Encounter: Payer: Self-pay | Admitting: Pharmacist

## 2024-01-24 VITALS — BP 146/84 | HR 69

## 2024-01-24 DIAGNOSIS — I10 Essential (primary) hypertension: Secondary | ICD-10-CM

## 2024-01-24 DIAGNOSIS — E0800 Diabetes mellitus due to underlying condition with hyperosmolarity without nonketotic hyperglycemic-hyperosmolar coma (NKHHC): Secondary | ICD-10-CM

## 2024-01-24 DIAGNOSIS — Z794 Long term (current) use of insulin: Secondary | ICD-10-CM

## 2024-01-24 DIAGNOSIS — E785 Hyperlipidemia, unspecified: Secondary | ICD-10-CM

## 2024-01-24 DIAGNOSIS — E119 Type 2 diabetes mellitus without complications: Secondary | ICD-10-CM

## 2024-01-24 MED ORDER — VALSARTAN 40 MG PO TABS: 40.0000 mg | ORAL_TABLET | Freq: Every day | ORAL | 5 refills | Status: DC

## 2024-01-24 NOTE — Patient Instructions (Addendum)
 It was nice meeting you today  We would like your blood pressure to be less than 130/80  Please continue your amlodipine 5mg  once a day in the morning  We will increase your valsartan to 120mg  (80mg  tablet plus a 40mg  tablet) once a day in the evening  Try to increase your physical activity. We recommend at least 30 minutes a day at least 5 days a week  Continue to check your blood pressure at home. Please send in some readings   I have also placed a lab order for you to have drawn next week  Laural Golden, PharmD, BCACP, CDCES, CPP [address] [place], [place], [address] Phone: [phone], Fax: [phone]      1st Floor: - Lobby - Registration  - Pharmacy  - Lab - Cafe  2nd Floor: - PV Lab - Diagnostic Testing (echo, CT, nuclear med)  3rd Floor: - Vacant  4th Floor: - TCTS (cardiothoracic surgery) - AFib Clinic - Structural Heart Clinic - Vascular Surgery  - Vascular Ultrasound  5th Floor: - [facility] (general and EP) - Clinical Pharmacy for coumadin, hypertension, lipid, weight-loss medications, and med management appointments    Valet parking services will be available as well.

## 2024-01-24 NOTE — Progress Notes (Signed)
 Patient ID: SERAYAH YAZDANI                 DOB: April 12, 1956                      MRN: [id number]     HPI: Rebecca Paul is a 68 y.o. female referred by Dr. Servando Salina to HTN clinic. PMH is significant for HTN, CAD, and T2DM. Losartan changed to valsartan at last visit.  Patient presents today to discuss blood pressure management. 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 Reports it was a high stress job. Still feels stress and anxiety regarding her past position. Does not like driving by her old office.  Has been checking blood pressure at home  and readings are varied:  4/2: 142/74 3/29: 148/81, 115/69, 106/67  Brought home BP cuff with her today. [employer] brand upper arm cuff. Had patient check in room: 168/91, 156/91, 166/89  Reports she is under a lot of stress due to politics. Recently retired and is worried her retirement funds will be depleted due to the [employer].   Has a gym membership and a treadmill at home but admits she is not using enough. Uses seasoning when she cooks food which contains sodium. Drinks 1-2 cups of coffee daily. Does not smoke or drink alcohol.  Current HTN meds:  Amlodipine 5mg  daily Valsartan 80mg  daily  Previously tried:  Lisinopril (cough)  BP goal: <130/80  Wt Readings from Last 3 Encounters:  12/27/23 154 lb 1.6 oz (69.9 kg)  11/23/23 156 lb 3.2 oz (70.9 kg)  11/03/23 156 lb (70.8 kg)   BP Readings from Last 3 Encounters:  12/27/23 (!) 152/100  11/23/23 (!) 152/84  11/03/23 (!) 146/82   Pulse Readings from Last 3 Encounters:  12/27/23 93  11/23/23 71  11/03/23 83    Renal function: CrCl cannot be calculated (Patient's most recent lab result is older than the maximum 21 days allowed.).  Past Medical History:  Diagnosis Date   Allergy    more sinus issues    Anemia    yrs past as child , with pregnancy    Arthritis    shoulders - not diagnosed    Headache(784.0)    Hyperlipidemia     Hyperparathyroidism (HCC) 07/28/2021   Hypertension    Kidney stone    Non-obstructive CAD    a. coronary CTA in 09/2021 showed a coronary calcium score of 92 (86th percentile for age and sex) and mild non-obstructive CAD   Osteopenia    Plantar fasciitis    Type 2 diabetes mellitus (HCC)     Current Outpatient Medications on File Prior to Visit  Medication Sig Dispense Refill   amLODipine (NORVASC) 5 MG tablet Take 1 tablet by mouth once daily 90 tablet 0   aspirin EC 81 MG tablet Take 1 tablet (81 mg total) by mouth daily. Swallow whole. 30 tablet 12   blood glucose meter kit and supplies KIT Dispense based on patient and insurance preference. Use up to four times daily as directed. (FOR ICD-9 250.00, 250.01). 1 each 0   blood glucose meter kit and supplies Use as directed once a day 1 each 0   Blood Glucose Monitoring Suppl (CONTOUR BLOOD GLUCOSE SYSTEM) w/Device KIT by Does not apply route.     Blood Glucose Monitoring Suppl (CONTOUR NEXT MONITOR) w/Device KIT Use as directed 1 kit 1   Cholecalciferol (VITAMIN D3)  1000 units CAPS Take by mouth.     co-enzyme Q-10 30 MG capsule Take 30 mg by mouth 3 (three) times daily.     Continuous Glucose Receiver (FREESTYLE LIBRE 3 READER) DEVI 1 each by Does not apply route as directed. 1 each 0   Continuous Glucose Sensor (FREESTYLE LIBRE 3 PLUS SENSOR) MISC Change sensor every 15 days. 2 each 11   ezetimibe (ZETIA) 10 MG tablet Take 1 tablet (10 mg total) by mouth daily. 90 tablet 3   famciclovir (FAMVIR) 250 MG tablet Take 1 tablet (250 mg total) by mouth 2 (two) times daily as needed. 60 tablet 1   fluticasone (FLONASE) 50 MCG/ACT nasal spray Place 2 sprays into both nostrils daily. 16 g 6   glucose blood (CONTOUR NEXT TEST) test strip USE AS INSTRUCTED THREE TIMES DAILY 100 each 3   metFORMIN (GLUCOPHAGE) 500 MG tablet TAKE 1 TABLET BY MOUTH TWICE DAILY WITH A MEAL.  START WITH 1 TABLET DAILY AND INCREASE TO TWICE  DAILY AS TOLERATED 180  tablet 1   Microlet Lancets MISC USE AS DIRECTED THREE TIMES DAILY 100 each 3   rosuvastatin (CRESTOR) 20 MG tablet Take 1 tablet by mouth once daily 90 tablet 3   triamcinolone cream (KENALOG) 0.1 % Apply 1 Application topically 2 (two) times daily. 45 g 1   valsartan (DIOVAN) 80 MG tablet Take 1 tablet (80 mg total) by mouth daily. 90 tablet 3   No current facility-administered medications on file prior to visit.    Allergies  Allergen Reactions   Dust Mite Extract Other (See Comments)    Congestion     Assessment/Plan:  1. Hypertension -  Patient BP in room 146/84 which is above goal of <130/80. Likely stress and lack of physical activity are contributing. Recommended increasing physical exercise to at least 30 minutes a day at least 5 days a week and to reduce amount of salt she seasons food with.  Discussed options such as increasing valsartan, changing administration times, or addition of a different agent such as hydrochlorothiazide. Patient hesitant to increase too much. Will change valsartan to 120mg  once daily in the evening and continue amlodipine 5mg  once daily in the morning. Patient to report readings back via mychart.   Continue amlodipine 5mg  daily Increase valsartan to 120mg  daily Check CMP  Laural Golden, PharmD, BCACP, CDCES, CPP [address], [address] [place], [place], [address] Phone: [phone], Fax: [phone]

## 2024-01-31 ENCOUNTER — Encounter: Payer: Self-pay | Admitting: Pharmacist

## 2024-02-13 ENCOUNTER — Encounter: Payer: Self-pay | Admitting: Family Medicine

## 2024-02-13 ENCOUNTER — Ambulatory Visit: Payer: Medicare Other

## 2024-02-13 ENCOUNTER — Ambulatory Visit (INDEPENDENT_AMBULATORY_CARE_PROVIDER_SITE_OTHER): Payer: Medicare Other | Admitting: Family Medicine

## 2024-02-13 VITALS — BP 132/88 | HR 70 | Temp 98.7°F | Ht 60.75 in | Wt 152.1 lb

## 2024-02-13 DIAGNOSIS — H9201 Otalgia, right ear: Secondary | ICD-10-CM | POA: Diagnosis not present

## 2024-02-13 DIAGNOSIS — E119 Type 2 diabetes mellitus without complications: Secondary | ICD-10-CM | POA: Diagnosis not present

## 2024-02-13 DIAGNOSIS — E785 Hyperlipidemia, unspecified: Secondary | ICD-10-CM | POA: Diagnosis not present

## 2024-02-13 DIAGNOSIS — I10 Essential (primary) hypertension: Secondary | ICD-10-CM

## 2024-02-13 DIAGNOSIS — Z0001 Encounter for general adult medical examination with abnormal findings: Secondary | ICD-10-CM

## 2024-02-13 DIAGNOSIS — Z0000 Encounter for general adult medical examination without abnormal findings: Secondary | ICD-10-CM

## 2024-02-13 DIAGNOSIS — Z79899 Other long term (current) drug therapy: Secondary | ICD-10-CM

## 2024-02-13 LAB — COMPREHENSIVE METABOLIC PANEL WITH GFR
ALT: 17 U/L (ref 0–35)
AST: 18 U/L (ref 0–37)
Albumin: 4.6 g/dL (ref 3.5–5.2)
Alkaline Phosphatase: 41 U/L (ref 39–117)
BUN: 12 mg/dL (ref 6–23)
CO2: 28 meq/L (ref 19–32)
Calcium: 9.6 mg/dL (ref 8.4–10.5)
Chloride: 103 meq/L (ref 96–112)
Creatinine, Ser: 0.71 mg/dL (ref 0.40–1.20)
GFR: 88.35 mL/min (ref >60.00)
Glucose, Bld: 108 mg/dL — ABNORMAL HIGH (ref 70–99)
Potassium: 4.4 meq/L (ref 3.5–5.1)
Sodium: 139 meq/L (ref 135–145)
Total Bilirubin: 0.5 mg/dL (ref 0.2–1.2)
Total Protein: 8.2 g/dL (ref 6.0–8.3)

## 2024-02-13 LAB — MICROALBUMIN / CREATININE URINE RATIO
Creatinine,U: 86.2 mg/dL
Microalb Creat Ratio: 27.8 mg/g (ref 0.0–30.0)
Microalb, Ur: 2.4 mg/dL — ABNORMAL HIGH (ref 0.0–1.9)

## 2024-02-13 LAB — LIPID PANEL
Cholesterol: 109 mg/dL (ref 0–200)
HDL: 56.1 mg/dL (ref >39.00)
LDL Cholesterol: 43 mg/dL (ref 0–99)
NonHDL: 53.05
Total CHOL/HDL Ratio: 2
Triglycerides: 50 mg/dL (ref 0.0–149.0)
VLDL: 10 mg/dL (ref 0.0–40.0)

## 2024-02-13 LAB — HEPATIC FUNCTION PANEL
ALT: 17 U/L (ref 0–35)
AST: 18 U/L (ref 0–37)
Albumin: 4.6 g/dL (ref 3.5–5.2)
Alkaline Phosphatase: 41 U/L (ref 39–117)
Bilirubin, Direct: 0.1 mg/dL (ref 0.0–0.3)
Total Bilirubin: 0.5 mg/dL (ref 0.2–1.2)
Total Protein: 8.2 g/dL (ref 6.0–8.3)

## 2024-02-13 LAB — HEMOGLOBIN A1C: Hgb A1c MFr Bld: 6.6 % — ABNORMAL HIGH (ref 4.6–6.5)

## 2024-02-13 NOTE — Patient Instructions (Signed)

## 2024-02-13 NOTE — Progress Notes (Signed)
 Complete physical exam  Patient: Rebecca Paul   DOB: Jun 21, 1956   68 y.o. Female  MRN: [id number]  Subjective:    Chief Complaint  Patient presents with   Annual Exam    Rebecca Paul is a 68 y.o. female who presents today for a complete physical exam. She reports consuming a general diet. Home exercise routine includes treadmill and walking 2-3 hrs per week. She generally feels fairly well. She reports sleeping fairly well. She does not have additional problems to discuss today.    Most recent fall risk assessment:    02/13/2024    9:14 AM  Fall Risk   Falls in the past year? 0  Number falls in past yr: 0  Injury with Fall? 0  Risk for fall due to : No Fall Risks  Follow up Falls evaluation completed     Most recent depression screenings:    02/13/2024    9:18 AM 06/22/2023   10:41 AM  PHQ 2/9 Scores  PHQ - 2 Score 0 0    Vision:Within last year and Dental: No current dental problems and Receives regular dental care  Patient Active Problem List   Diagnosis Date Noted   Seasonal allergies 11/03/2023   Mild CAD 11/25/2021   Hyperlipidemia 11/25/2021   Osteopenia 08/25/2020   Vitamin D  deficiency 11/20/2019   Hyperparathyroidism (HCC) 11/20/2019   Hypercalcemia 11/20/2019   DM II (diabetes mellitus, type II), controlled (HCC) 04/09/2019   Backache 07/06/2010   Essential hypertension 04/22/2009   NEPHROLITHIASIS 10/30/2007      Patient Care Team: Aida House, MD as PCP - General (Family Medicine) Jerryl Morin, DO as PCP - Cardiology (Cardiology) Wolm Hawthorne, MD as Attending Physician (Dermatology) [facility], [facility] G[facility], RD as Dietitian (Family Medicine)   Outpatient Medications Prior to Visit  Medication Sig   amLODipine  (NORVASC ) 5 MG tablet Take 1 tablet by mouth once daily   aspirin  EC 81 MG tablet Take 1 tablet (81 mg total) by mouth daily. Swallow whole.   blood glucose meter kit and supplies KIT Dispense based on  patient and insurance preference. Use up to four times daily as directed. (FOR ICD-9 250.00, 250.01).   blood glucose meter kit and supplies Use as directed once a day   Blood Glucose Monitoring Suppl (CONTOUR BLOOD GLUCOSE SYSTEM) w/Device KIT by Does not apply route.   Blood Glucose Monitoring Suppl (CONTOUR NEXT MONITOR) w/Device KIT Use as directed   Cholecalciferol (VITAMIN D3) 1000 units CAPS Take by mouth.   co-enzyme Q-10 30 MG capsule Take 30 mg by mouth 3 (three) times daily.   Continuous Glucose Receiver (FREESTYLE LIBRE 3 READER) DEVI 1 each by Does not apply route as directed.   Continuous Glucose Sensor (FREESTYLE LIBRE 3 PLUS SENSOR) MISC Change sensor every 15 days.   ezetimibe  (ZETIA ) 10 MG tablet Take 1 tablet (10 mg total) by mouth daily.   famciclovir  (FAMVIR ) 250 MG tablet Take 1 tablet (250 mg total) by mouth 2 (two) times daily as needed.   fluticasone  (FLONASE ) 50 MCG/ACT nasal spray Place 2 sprays into both nostrils daily.   glucose blood (CONTOUR NEXT TEST) test strip USE AS INSTRUCTED THREE TIMES DAILY   metFORMIN  (GLUCOPHAGE ) 500 MG tablet Take 1 tablet (500 mg total) by mouth 2 (two) times daily with a meal.   Microlet Lancets MISC USE AS DIRECTED THREE TIMES DAILY   rosuvastatin  (CRESTOR ) 20 MG tablet Take 1 tablet by mouth  once daily   triamcinolone  cream (KENALOG ) 0.1 % Apply 1 Application topically 2 (two) times daily.   valsartan  (DIOVAN ) 40 MG tablet Take 1 tablet (40 mg total) by mouth daily.   valsartan  (DIOVAN ) 80 MG tablet Take 1 tablet (80 mg total) by mouth daily.   No facility-administered medications prior to visit.    Review of Systems  HENT:  Negative for hearing loss.   Eyes:  Negative for blurred vision.  Respiratory:  Negative for shortness of breath.   Cardiovascular:  Negative for chest pain.  Gastrointestinal: Negative.   Genitourinary: Negative.   Musculoskeletal:  Negative for back pain.  Neurological:  Negative for headaches.   Psychiatric/Behavioral:  Negative for depression.        Objective:     BP 132/88   Pulse 70   Temp 98.7 F (37.1 C) (Oral)   Ht 5' 0.75" (1.543 m)   Wt 152 lb 1.6 oz (69 kg)   SpO2 99%   BMI 28.98 kg/m    Physical Exam Vitals reviewed.  Constitutional:      Appearance: Normal appearance. She is well-groomed and normal weight.  HENT:     Right Ear: Tympanic membrane and ear canal normal.     Left Ear: Tympanic membrane and ear canal normal.     Mouth/Throat:     Mouth: Mucous membranes are moist.     Pharynx: No posterior oropharyngeal erythema.  Eyes:     Conjunctiva/sclera: Conjunctivae normal.  Neck:     Thyroid : No thyromegaly.  Cardiovascular:     Rate and Rhythm: Normal rate and regular rhythm.     Pulses: Normal pulses.     Heart sounds: S1 normal and S2 normal.  Pulmonary:     Effort: Pulmonary effort is normal.     Breath sounds: Normal breath sounds and air entry.  Abdominal:     General: Abdomen is flat. Bowel sounds are normal.     Palpations: Abdomen is soft.  Musculoskeletal:     Right lower leg: No edema.     Left lower leg: No edema.  Lymphadenopathy:     Cervical: No cervical adenopathy.  Neurological:     Mental Status: She is alert and oriented to person, place, and time. Mental status is at baseline.     Gait: Gait is intact.  Psychiatric:        Mood and Affect: Mood and affect normal.        Speech: Speech normal.        Behavior: Behavior normal.        Judgment: Judgment normal.      No results found for any visits on 02/13/24.     Assessment & Plan:    Routine Health Maintenance and Physical Exam  Immunization History  Administered Date(s) Administered   Fluad Quad(high Dose 65+) 06/25/2022   Fluad Trivalent(High Dose 65+) 07/29/2023   Influenza Split 07/22/2011   Influenza Whole 07/18/2002, 08/18/2007, 07/30/2009, 07/06/2010   Influenza,inj,Quad PF,6+ Mos 08/06/2013, 08/13/2013, 08/11/2015, 07/18/2017, 09/01/2018,  07/16/2019, 07/03/2021   Influenza,inj,quad, With Preservative 10/14/2017   Influenza-Unspecified 07/18/2020   Moderna Sars-Covid-2 Vaccination 12/20/2019, 01/22/2020, 08/05/2020, 02/21/2021, 07/20/2021   PNEUMOCOCCAL CONJUGATE-20 09/20/2022   Pneumococcal Polysaccharide-23 07/31/2019   Td 05/18/2001   Tdap 06/15/2013   Unspecified SARS-COV-2 Vaccination 12/24/2023   Zoster Recombinant(Shingrix ) 05/01/2018, 07/03/2018    Health Maintenance  Topic Date Due   DTaP/Tdap/Td (3 - Td or Tdap) 06/16/2023   OPHTHALMOLOGY EXAM  01/05/2024   Diabetic  kidney evaluation - Urine ACR  01/27/2024   HEMOGLOBIN A1C  05/02/2024   INFLUENZA VACCINE  05/18/2024   Medicare Annual Wellness (AWV)  06/21/2024   COVID-19 Vaccine (7 - Moderna risk 2024-25 season) 06/25/2024   MAMMOGRAM  07/19/2024   Diabetic kidney evaluation - eGFR measurement  07/28/2024   FOOT EXAM  02/12/2025   Colonoscopy  01/24/2028   Pneumonia Vaccine 6+ Years old  Completed   DEXA SCAN  Completed   Hepatitis C Screening  Completed   Zoster Vaccines- Shingrix   Completed   HPV VACCINES  Aged Out   Meningococcal B Vaccine  Aged Out    Discussed health benefits of physical activity, and encouraged her to engage in regular exercise appropriate for her age and condition.  Routine general medical examination at a health care facility      Normal physical exam findings today. I counseled the patient on healthy sleep habits and other things that could be interfering with her sleep, specifically OSA. I do not think this is the case, however I recommended she might try a snoring/ sleep app that tracks her sleep quality at night. Handouts given on healthy eating and exercise, labs ordered for surveillance.   Essential hypertension -     Comprehensive metabolic panel with GFR; Future  Controlled type 2 diabetes mellitus without complication, without long-term current use of insulin (HCC) -     Hemoglobin A1c; Future -     Microalbumin /  creatinine urine ratio; Future  Hyperlipidemia, unspecified hyperlipidemia type -     Lipid panel; Future  Otalgia of right ear -     Ambulatory referral to ENT    Return in 6 months (on 08/14/2024).     Aida House, MD

## 2024-02-14 ENCOUNTER — Encounter: Payer: Self-pay | Admitting: Family Medicine

## 2024-02-22 ENCOUNTER — Encounter (INDEPENDENT_AMBULATORY_CARE_PROVIDER_SITE_OTHER): Payer: Self-pay | Admitting: Otolaryngology

## 2024-02-27 ENCOUNTER — Telehealth: Payer: Self-pay

## 2024-02-27 ENCOUNTER — Ambulatory Visit

## 2024-02-27 DIAGNOSIS — E1165 Type 2 diabetes mellitus with hyperglycemia: Secondary | ICD-10-CM

## 2024-02-27 NOTE — Telephone Encounter (Signed)
 Per Dr. Bambi Lever, referral needs to be placed to [facility] Cathern Clover, PA-C at [facility] for Diabetes control.

## 2024-02-28 ENCOUNTER — Telehealth: Payer: Self-pay

## 2024-02-28 NOTE — Telephone Encounter (Signed)
 Called pt and verified identification. Explained to patient why the tetanus vaccine is normally not covered in office through Medicare. Explained to patient that Medicare Part D is the pharmacy benefit. Explained to patient the office can administer the tetanus vaccine in office if she wish but explained that she would have to sign an ABN. I explained the ABN is a document explaining if the insurance provider does not cover the expenses of the vaccine that the patient would be responsible. Patient voiced understanding. Patient stated she will follow up with her insurance provider again. Patient was polite and thankful for the information. Patient states she will follow up on Thursday in office at her next appt.

## 2024-02-28 NOTE — Telephone Encounter (Signed)
 Pt arrived for a Clinical Support Visit. Pt was scheduled for a tetanus vaccination and a review of recent lab results. Patient was understandably upset after she arrived that she was not scheduled with the PCP as she thought to review lab results. Patient also was told she could not receive the tetanus vaccine in office due to her Medicare coverage insurance. Patient stated that she had called her insurance provider and spoke with a representative regarding the tetanus vaccine and was told her insurance would cover the vaccine if given in the office. Patient was able to give me a name of the representative and a reference number of the conversation with the insurance provider. The reference number given was [id number]. I explained to the patient that Medicare does not cover the tetanus vaccine unless it is received at a pharmacy. I explained to the patient that I would follow up with management regarding further information about coverage of the vaccine. Patient was also understandably upset she was scheduled improperly. Patient voiced concerns and I agreed she was to be scheduled with the PCP instead. I was able to schedule the patient with the PCP this week to review recent lab results. I told the patient I would follow up with her once I received more information for her regarding the vaccine. Patient voiced understanding.

## 2024-02-29 ENCOUNTER — Encounter (INDEPENDENT_AMBULATORY_CARE_PROVIDER_SITE_OTHER): Payer: Self-pay

## 2024-03-01 ENCOUNTER — Ambulatory Visit: Admitting: Family Medicine

## 2024-03-01 ENCOUNTER — Encounter: Payer: Self-pay | Admitting: Family Medicine

## 2024-03-01 VITALS — BP 148/80 | HR 93 | Temp 98.7°F | Ht 60.75 in | Wt 151.7 lb

## 2024-03-01 DIAGNOSIS — Z7984 Long term (current) use of oral hypoglycemic drugs: Secondary | ICD-10-CM

## 2024-03-01 DIAGNOSIS — Z23 Encounter for immunization: Secondary | ICD-10-CM

## 2024-03-01 DIAGNOSIS — E1165 Type 2 diabetes mellitus with hyperglycemia: Secondary | ICD-10-CM | POA: Diagnosis not present

## 2024-03-01 DIAGNOSIS — I10 Essential (primary) hypertension: Secondary | ICD-10-CM | POA: Diagnosis not present

## 2024-03-01 NOTE — Progress Notes (Signed)
 Established Patient Office Visit  Subjective   Patient ID: Rebecca Paul, female    DOB: Dec 27, 1955  Age: 68 y.o. MRN: [id number]  Chief Complaint  Patient presents with   Results    Pt is here with questions about her lab work. She had questions about the urine testing and what the protein means. I spent 20 minutes explaining the labs to her and what monitoring needs to be done yearly and why we do it yearly.   Of note BP was elevated today, pt denies any chest pain or SOB, states she took her medication as prescribed today.     Current Outpatient Medications  Medication Instructions   amLODipine  (NORVASC ) 5 mg, Oral, Daily   aspirin  EC 81 mg, Oral, Daily, Swallow whole.   blood glucose meter kit and supplies KIT Dispense based on patient and insurance preference. Use up to four times daily as directed. (FOR ICD-9 250.00, 250.01).   blood glucose meter kit and supplies Use as directed once a day   Blood Glucose Monitoring Suppl (CONTOUR BLOOD GLUCOSE SYSTEM) w/Device KIT by Does not apply route.   Blood Glucose Monitoring Suppl (CONTOUR NEXT MONITOR) w/Device KIT Use as directed   Cholecalciferol (VITAMIN D3) 1000 units CAPS Take by mouth.   co-enzyme Q-10 30 mg, 3 times daily   Continuous Glucose Receiver (FREESTYLE LIBRE 3 READER) DEVI 1 each, Does not apply, As directed   Continuous Glucose Sensor (FREESTYLE LIBRE 3 PLUS SENSOR) MISC Change sensor every 15 days.   ezetimibe  (ZETIA ) 10 mg, Oral, Daily   famciclovir  (FAMVIR ) 250 mg, Oral, 2 times daily PRN   fluticasone  (FLONASE ) 50 MCG/ACT nasal spray 2 sprays, Each Nare, Daily   glucose blood (CONTOUR NEXT TEST) test strip USE AS INSTRUCTED THREE TIMES DAILY   metFORMIN  (GLUCOPHAGE ) 500 mg, Oral, 2 times daily with meals   Microlet Lancets MISC 3 times daily, as directed   rosuvastatin  (CRESTOR ) 20 mg, Oral, Daily   triamcinolone  cream (KENALOG ) 0.1 % 1 Application, Topical, 2 times daily   valsartan  (DIOVAN ) 80 mg, Oral,  Daily   valsartan  (DIOVAN ) 40 mg, Oral, Daily    Patient Active Problem List   Diagnosis Date Noted   Seasonal allergies 11/03/2023   Mild CAD 11/25/2021   Hyperlipidemia 11/25/2021   Osteopenia 08/25/2020   Vitamin D  deficiency 11/20/2019   Hyperparathyroidism (HCC) 11/20/2019   Hypercalcemia 11/20/2019   DM II (diabetes mellitus, type II), controlled (HCC) 04/09/2019   Backache 07/06/2010   Essential hypertension 04/22/2009   NEPHROLITHIASIS 10/30/2007      Review of Systems  All other systems reviewed and are negative.     Objective:     BP (!) 148/80 (BP Location: Left Arm, Patient Position: Sitting, Cuff Size: Normal)   Pulse 93   Temp 98.7 F (37.1 C) (Oral)   Ht 5' 0.75" (1.543 m)   Wt 151 lb 11.2 oz (68.8 kg)   SpO2 97%   BMI 28.90 kg/m    Physical Exam Vitals reviewed.  Constitutional:      Appearance: Normal appearance. She is normal weight.  Pulmonary:     Effort: Pulmonary effort is normal.  Neurological:     Mental Status: She is alert and oriented to person, place, and time. Mental status is at baseline.      No results found for any visits on 03/01/24.    The ASCVD Risk score (Arnett DK, et al., 2019) failed to calculate for the following reasons:  The valid total cholesterol range is 130 to 320 mg/dL    Assessment & Plan:  Need for diphtheria-tetanus-pertussis (Tdap) vaccine -     Tdap vaccine greater than or equal to 7yo IM  Controlled type 2 diabetes mellitus with hyperglycemia, without long-term current use of insulin (HCC) -     Referral to Nutrition and Diabetes Services  Essential hypertension Assessment & Plan: BP was elevated initially in office, pt states her home readings are 130's usually. Recheck today was 148/80    I spent 20 minutes explaining the testing pt underwent and the significance of the change in numbers. I rechecked her BP due to the high reading and it was better at 148/80 and I reviewed the patient's  home readings that she brought in. She is going to see the cardiologist at the end of the month and will get her BP rechecked then, further orders per their recommendations. I have placed a referral to the nutritionist for counseling regarding the patient's diet and exercise to help her achieve her goal of lower A1C without adding medication.   No follow-ups on file.    Aida House, MD

## 2024-03-01 NOTE — Assessment & Plan Note (Signed)
 BP was elevated initially in office, pt states her home readings are 130's usually. Recheck today was 148/80

## 2024-03-05 DIAGNOSIS — K080 Exfoliation of teeth due to systemic causes: Secondary | ICD-10-CM | POA: Diagnosis not present

## 2024-03-14 ENCOUNTER — Encounter: Payer: Self-pay | Admitting: Cardiology

## 2024-03-14 ENCOUNTER — Ambulatory Visit: Payer: Medicare Other | Attending: Cardiology | Admitting: Cardiology

## 2024-03-14 ENCOUNTER — Telehealth: Payer: Self-pay | Admitting: Cardiology

## 2024-03-14 VITALS — BP 162/76 | HR 82 | Ht 61.0 in | Wt 152.8 lb

## 2024-03-14 DIAGNOSIS — I2510 Atherosclerotic heart disease of native coronary artery without angina pectoris: Secondary | ICD-10-CM

## 2024-03-14 DIAGNOSIS — E782 Mixed hyperlipidemia: Secondary | ICD-10-CM | POA: Diagnosis not present

## 2024-03-14 DIAGNOSIS — I10 Essential (primary) hypertension: Secondary | ICD-10-CM | POA: Diagnosis not present

## 2024-03-14 MED ORDER — VALSARTAN 80 MG PO TABS: 80.0000 mg | ORAL_TABLET | Freq: Two times a day (BID) | ORAL | 3 refills | Status: AC

## 2024-03-14 NOTE — Progress Notes (Signed)
 Cardiology Office Note:    Date:  03/23/2024   ID:  Rebecca Paul, DOB [birth year], MRN [id number]  PCP:  Aida House, MD  Cardiologist:  Jerryl Morin, DO  Electrophysiologist:  None   Referring MD: Aida House, MD    History of Present Illness:    Rebecca Paul is a 68 y.o. female with a hx of hypertension and coronary artery disease who presents for follow-up on her blood pressure and cholesterol management.  Her blood pressure readings are variable, with a recent reading of 162/76 mmHg. This morning, it was 122/78 mmHg before medication and without breakfast or coffee. Her home monitor confirms similar high readings. She is on valsartan  and amlodipine  for blood pressure control but is not exercising regularly.  She is managing her cholesterol levels with Zetia , achieving a level of 43 mg/dL, below the target of 55 mg/dL. A CT scan last year indicated mild coronary artery disease. She is also on metformin  for type 2 diabetes, with a stable hemoglobin A1c of 6.6%.  Past Medical History:  Diagnosis Date   Allergy    more sinus issues    Anemia    yrs past as child , with pregnancy    Arthritis    shoulders - not diagnosed    Headache(784.0)    Hyperlipidemia    Hyperparathyroidism (HCC) 07/28/2021   Hypertension    Kidney stone    Non-obstructive CAD    a. coronary CTA in 09/2021 showed a coronary calcium  score of 92 (86th percentile for age and sex) and mild non-obstructive CAD   Osteopenia    Plantar fasciitis    Type 2 diabetes mellitus (HCC)     Past Surgical History:  Procedure Laterality Date   ABDOMINAL HYSTERECTOMY     fibroids   CESAREAN SECTION     COLONOSCOPY  01/2018   normal   OOPHORECTOMY     dermoid tumor   TONSILLECTOMY AND ADENOIDECTOMY      Current Medications: Current Meds  Medication Sig   amLODipine  (NORVASC ) 5 MG tablet Take 1 tablet by mouth once daily   aspirin  EC 81 MG tablet Take 1 tablet (81 mg total) by mouth daily.  Swallow whole.   Cholecalciferol (VITAMIN D3) 1000 units CAPS Take by mouth.   famciclovir  (FAMVIR ) 250 MG tablet Take 1 tablet (250 mg total) by mouth 2 (two) times daily as needed.   fluticasone  (FLONASE ) 50 MCG/ACT nasal spray Place 2 sprays into both nostrils daily.   metFORMIN  (GLUCOPHAGE ) 500 MG tablet Take 1 tablet (500 mg total) by mouth 2 (two) times daily with a meal.   rosuvastatin  (CRESTOR ) 20 MG tablet Take 1 tablet by mouth once daily   triamcinolone  cream (KENALOG ) 0.1 % Apply 1 Application topically 2 (two) times daily.   [DISCONTINUED] valsartan  (DIOVAN ) 40 MG tablet Take 1 tablet (40 mg total) by mouth daily.   [DISCONTINUED] valsartan  (DIOVAN ) 80 MG tablet Take 1 tablet (80 mg total) by mouth daily.     Allergies:   Dust mite extract and Lisinopril    Social History   Socioeconomic History   Marital status: Widowed    Spouse name: Not on file   Number of children: Not on file   Years of education: Not on file   Highest education level: Bachelor's degree (e.g., BA, AB, BS)  Occupational History   Not on file  Tobacco Use   Smoking status: Never   Smokeless tobacco: Never  Vaping Use   Vaping  status: Never Used  Substance and Sexual Activity   Alcohol use: Yes    Comment: rare glass of wine    Drug use: No   Sexual activity: Not Currently  Other Topics Concern   Not on file  Social History Narrative   Not on file   Social Drivers of Health   Financial Resource Strain: Low Risk  (02/13/2024)   Overall Financial Resource Strain (CARDIA)    Difficulty of Paying Living Expenses: Not hard at all  Food Insecurity: No Food Insecurity (02/13/2024)   Hunger Vital Sign    Worried About Running Out of Food in the Last Year: Never true    Ran Out of Food in the Last Year: Never true  Transportation Needs: No Transportation Needs (02/13/2024)   PRAPARE - Administrator, Civil Service (Medical): No    Lack of Transportation (Non-Medical): No  Physical  Activity: Insufficiently Active (02/13/2024)   Exercise Vital Sign    Days of Exercise per Week: 3 days    Minutes of Exercise per Session: 30 min  Stress: No Stress Concern Present (02/13/2024)   [employer] of Occupational Health - Occupational Stress Questionnaire    Feeling of Stress : Not at all  Social Connections: Moderately Integrated (02/13/2024)   Social Connection and Isolation Panel [NHANES]    Frequency of Communication with Friends and Family: More than three times a week    Frequency of Social Gatherings with Friends and Family: More than three times a week    Attends Religious Services: More than 4 times per year    Active Member of [employer] or Organizations: Yes    Attends Banker Meetings: More than 4 times per year    Marital Status: Widowed     Family History: The patient's family history includes Cancer in her father and another family member; Diabetes in her maternal grandmother, mother, sister, and another family member; Heart attack in her sister; Hyperlipidemia in her mother and another family member; Hypertension in her mother and another family member; Kidney disease in an other family member; Obesity in her sister and sister; Thyroid  disease in her mother. There is no history of Colon cancer, Colon polyps, Rectal cancer, or Stomach cancer.  ROS:   Review of Systems  Constitution: Negative for decreased appetite, fever and weight gain.  HENT: Negative for congestion, ear discharge, hoarse voice and sore throat.   Eyes: Negative for discharge, redness, vision loss in right eye and visual halos.  Cardiovascular: Negative for chest pain, dyspnea on exertion, leg swelling, orthopnea and palpitations.  Respiratory: Negative for cough, hemoptysis, shortness of breath and snoring.   Endocrine: Negative for heat intolerance and polyphagia.  Hematologic/Lymphatic: Negative for bleeding problem. Does not bruise/bleed easily.  Skin: Negative for flushing,  nail changes, rash and suspicious lesions.  Musculoskeletal: Negative for arthritis, joint pain, muscle cramps, myalgias, neck pain and stiffness.  Gastrointestinal: Negative for abdominal pain, bowel incontinence, diarrhea and excessive appetite.  Genitourinary: Negative for decreased libido, genital sores and incomplete emptying.  Neurological: Negative for brief paralysis, focal weakness, headaches and loss of balance.  Psychiatric/Behavioral: Negative for altered mental status, depression and suicidal ideas.  Allergic/Immunologic: Negative for HIV exposure and persistent infections.    EKGs/Labs/Other Studies Reviewed:    The following studies were reviewed today:   EKG:  The ekg ordered today demonstrates   Recent Labs: 02/13/2024: ALT 17; ALT 17; BUN 12; Creatinine, Ser 0.71; Potassium 4.4; Sodium 139  Recent Lipid  Panel    Component Value Date/Time   CHOL 109 02/13/2024 0952   CHOL 206 (H) 07/08/2023 0000   TRIG 50.0 02/13/2024 0952   TRIG 95 09/26/2006 1002   HDL 56.10 02/13/2024 0952   HDL 64 07/08/2023 0000   CHOLHDL 2 02/13/2024 0952   VLDL 10.0 02/13/2024 0952   LDLCALC 43 02/13/2024 0952   LDLCALC 127 (H) 07/08/2023 0000   LDLCALC 116 (H) 08/25/2020 1158   LDLDIRECT 140.3 12/04/2012 1039    Physical Exam:    VS:  BP (!) 162/76 (BP Location: Right Arm, Patient Position: Sitting, Cuff Size: Normal)   Pulse 82   Ht 5\' 1"  (1.549 m)   Wt 152 lb 12.8 oz (69.3 kg)   SpO2 96%   BMI 28.87 kg/m     Wt Readings from Last 3 Encounters:  03/14/24 152 lb 12.8 oz (69.3 kg)  03/01/24 151 lb 11.2 oz (68.8 kg)  02/13/24 152 lb 1.6 oz (69 kg)     GEN: Well nourished, well developed in no acute distress HEENT: Normal NECK: No JVD; No carotid bruits LYMPHATICS: No lymphadenopathy CARDIAC: S1S2 noted,RRR, no murmurs, rubs, gallops RESPIRATORY:  Clear to auscultation without rales, wheezing or rhonchi  ABDOMEN: Soft, non-tender, non-distended, +bowel sounds, no  guarding. EXTREMITIES: No edema, No cyanosis, no clubbing MUSCULOSKELETAL:  No deformity  SKIN: Warm and dry NEUROLOGIC:  Alert and oriented x 3, non-focal PSYCHIATRIC:  Normal affect, good insight  ASSESSMENT:    1. Mixed hyperlipidemia   2. Coronary artery disease involving native coronary artery of native heart without angina pectoris   3. Primary hypertension    PLAN:    Coronary artery disease - Coronary artery disease confirmed by coronary CTA in December 2022, showing mild plaque formation. Continue aspirin  81 mg , crestor  20 mg daily and zetia  10 mg daily.  Hypertension - Blood pressure variable. Recent readings: 122/78 mmHg at home, 162/76 mmHg in office. Current medications: valsartan , amlodipine .  - Increase valsartan  to 80 mg twice daily. - Continue amlodipine  as prescribed. - Monitor blood pressure at home for one week and report readings. - Follow up with PharmD hypertension in six weeks.  The patient is in agreement with the above plan. The patient left the office in stable condition.  The patient will follow up in   Medication Adjustments/Labs and Tests Ordered: Current medicines are reviewed at length with the patient today.  Concerns regarding medicines are outlined above.  No orders of the defined types were placed in this encounter.  Meds ordered this encounter  Medications   valsartan  (DIOVAN ) 80 MG tablet    Sig: Take 1 tablet (80 mg total) by mouth 2 (two) times daily.    Dispense:  180 tablet    Refill:  3    Patient Instructions  Medication Instructions:   INCREASE VALSARTAN  TO 80 MG TWICE DAILY  *If you need a refill on your cardiac medications before your next appointment, please call your pharmacy*  Follow-Up: At [facility], you and your health needs are our priority.  As part of our continuing mission to provide you with exceptional heart care, our providers are all part of one team.  This team includes your primary Cardiologist  (physician) and Advanced Practice Providers or APPs (Physician Assistants and Nurse Practitioners) who all work together to provide you with the care you need, when you need it.  Your next appointment:   6 week(s)  Provider:   PHARMACIST IN THE HYPERTENSION CLINIC  Your physician wants you to follow-up in: 6 MONTHS WITH DR Reniya Mcclees You will receive a reminder letter in the mail two months in advance. If you don't receive a letter, please call our office to schedule the follow-up appointment.    Other Instructions  CHECK BLOOD PRESSURE ONCE DAILY 2 HOURS AFTER TAKING MEDICATIONS AND SEND TO US  IN 1 WEEK       Adopting a Healthy Lifestyle.  Know what a healthy weight is for you (roughly BMI <25) and aim to maintain this   Aim for 7+ servings of fruits and vegetables daily   65-80+ fluid ounces of water or unsweet tea for healthy kidneys   Limit to max 1 drink of alcohol per day; avoid smoking/tobacco   Limit animal fats in diet for cholesterol and heart health - choose grass fed whenever available   Avoid highly processed foods, and foods high in saturated/trans fats   Aim for low stress - take time to unwind and care for your mental health   Aim for 150 min of moderate intensity exercise weekly for heart health, and weights twice weekly for bone health   Aim for 7-9 hours of sleep daily   When it comes to diets, agreement about the perfect plan isnt easy to find, even among the experts. Experts at the [facility] of [employer] developed an idea known as the Healthy Eating Plate. Just imagine a plate divided into logical, healthy portions.   The emphasis is on diet quality:   Load up on vegetables and fruits - one-half of your plate: Aim for color and variety, and remember that potatoes dont count.   Go for whole grains - one-quarter of your plate: Whole wheat, barley, wheat berries, quinoa, oats, brown rice, and foods made with them. If you want pasta, go with whole  wheat pasta.   Protein power - one-quarter of your plate: Fish, chicken, beans, and nuts are all healthy, versatile protein sources. Limit red meat.   The diet, however, does go beyond the plate, offering a few other suggestions.   Use healthy plant oils, such as olive, canola, soy, corn, sunflower and peanut. Check the labels, and avoid partially hydrogenated oil, which have unhealthy trans fats.   If youre thirsty, drink water. Coffee and tea are good in moderation, but skip sugary drinks and limit milk and dairy products to one or two daily servings.   The type of carbohydrate in the diet is more important than the amount. Some sources of carbohydrates, such as vegetables, fruits, whole grains, and beans-are healthier than others.   Finally, stay active  Signed, Kerem Gilmer, DO  03/23/2024 10:44 PM    [facility]

## 2024-03-14 NOTE — Telephone Encounter (Signed)
 Called patient at 12pm on 03/14/2024 to schedule pharmacy appointment for July 18th at the [facility] center. Left voicemail.

## 2024-03-14 NOTE — Patient Instructions (Signed)
 Medication Instructions:   INCREASE VALSARTAN  TO 80 MG TWICE DAILY  *If you need a refill on your cardiac medications before your next appointment, please call your pharmacy*  Follow-Up: At [facility], you and your health needs are our priority.  As part of our continuing mission to provide you with exceptional heart care, our providers are all part of one team.  This team includes your primary Cardiologist (physician) and Advanced Practice Providers or APPs (Physician Assistants and Nurse Practitioners) who all work together to provide you with the care you need, when you need it.  Your next appointment:   6 week(s)  Provider:   PHARMACIST IN THE HYPERTENSION CLINIC   Your physician wants you to follow-up in: 6 MONTHS WITH DR TOBB You will receive a reminder letter in the mail two months in advance. If you don't receive a letter, please call our office to schedule the follow-up appointment.    Other Instructions  CHECK BLOOD PRESSURE ONCE DAILY 2 HOURS AFTER TAKING MEDICATIONS AND SEND TO US  IN 1 WEEK

## 2024-03-14 NOTE — Telephone Encounter (Signed)
 Pharmacy appt for 6 weeks per [facility]. On 7/17.

## 2024-03-15 NOTE — Telephone Encounter (Signed)
 Patient aware of appointment

## 2024-03-26 ENCOUNTER — Other Ambulatory Visit: Payer: Self-pay | Admitting: Family Medicine

## 2024-03-26 DIAGNOSIS — E1165 Type 2 diabetes mellitus with hyperglycemia: Secondary | ICD-10-CM

## 2024-03-27 ENCOUNTER — Telehealth: Payer: Self-pay

## 2024-03-27 ENCOUNTER — Other Ambulatory Visit (HOSPITAL_COMMUNITY): Payer: Self-pay

## 2024-03-27 MED ORDER — FREESTYLE PRECISION NEO TEST VI STRP: ORAL_STRIP | 1 refills | Status: AC

## 2024-03-27 NOTE — Telephone Encounter (Signed)
 Pharmacy Patient Advocate Encounter   Received notification from Patient Pharmacy that prior authorization for [facility] Precision Test Strps is required/requested.   Insurance verification completed.   The patient is insured through [employer] .   Per test claim:  One Touch or Contour is preferred by the insurance.  If suggested medication is appropriate, Please send in a new RX and discontinue this one. If not, please advise as to why it's not appropriate so that we may request a Prior Authorization. Please note, some preferred medications may still require a PA.  If the suggested medications have not been trialed and there are no contraindications to their use, the PA will not be submitted, as it will not be approved.

## 2024-03-27 NOTE — Telephone Encounter (Signed)
 Rx done.

## 2024-03-27 NOTE — Addendum Note (Signed)
 Addended by: Henry Loge on: 03/27/2024 11:08 AM   Modules accepted: Orders

## 2024-03-27 NOTE — Telephone Encounter (Signed)
 Copied from CRM [id number]. Topic: Clinical - Prescription Issue >> Mar 27, 2024  9:06 AM Jenna Moan wrote: Reason for CRM: Patient said that her insurance pays for the medication FREESTYLE PRECISION NEO TEST test strip but it was denied and the pharmacy will be sending a request again and patient will like a call back that its been approved

## 2024-03-30 DIAGNOSIS — N200 Calculus of kidney: Secondary | ICD-10-CM | POA: Diagnosis not present

## 2024-03-30 DIAGNOSIS — N281 Cyst of kidney, acquired: Secondary | ICD-10-CM | POA: Diagnosis not present

## 2024-03-30 DIAGNOSIS — R351 Nocturia: Secondary | ICD-10-CM | POA: Diagnosis not present

## 2024-04-02 ENCOUNTER — Telehealth: Payer: Self-pay | Admitting: *Deleted

## 2024-04-02 DIAGNOSIS — E1165 Type 2 diabetes mellitus with hyperglycemia: Secondary | ICD-10-CM

## 2024-04-02 NOTE — Telephone Encounter (Signed)
 [facility] faxed a note stating the patient has requested a [employer].  Message sent to PCP.

## 2024-04-03 ENCOUNTER — Other Ambulatory Visit (HOSPITAL_COMMUNITY): Payer: Self-pay

## 2024-04-03 MED ORDER — FREESTYLE LIBRE 3 PLUS SENSOR MISC: 11 refills | Status: AC

## 2024-04-03 NOTE — Telephone Encounter (Signed)
 Script sent

## 2024-04-05 ENCOUNTER — Telehealth: Payer: Self-pay

## 2024-04-05 ENCOUNTER — Other Ambulatory Visit (HOSPITAL_COMMUNITY): Payer: Self-pay

## 2024-04-05 NOTE — Telephone Encounter (Signed)
 Pharmacy Patient Advocate Encounter   Received notification from Onbase that prior authorization for FreeStyle Precision Neo test strips is required/requested.   Insurance verification completed.   The patient is insured through [facility] .   Per test claim:  One Touch or Contour is preferred by the insurance.  If suggested medication is appropriate, Please send in a new RX and discontinue this one. If not, please advise as to why it's not appropriate so that we may request a Prior Authorization. Please note, some preferred medications may still require a PA.  If the suggested medications have not been trialed and there are no contraindications to their use, the PA will not be submitted, as it will not be approved.   This is the second request. See encounter 03/27/24.  FreeStyle is non formulary and will NOT be covered.

## 2024-04-06 ENCOUNTER — Telehealth: Payer: Self-pay

## 2024-04-06 NOTE — Telephone Encounter (Signed)
 Copied from CRM [id number]. Topic: General - Other >> Apr 06, 2024 11:17 AM Alysia Jumbo S wrote: Reason for CRM: Att: Shamere Dilworth- Patient returning missed phone call. Requested a callback at [phone] or [phone]

## 2024-04-06 NOTE — Telephone Encounter (Signed)
 Follow up with pharmacy and spoke to [facility]. She reports pt's insurance doesn't cover FREESTYLE PRECISION NEO TEST test strip. She added without insurance, Rx cost $39.98. she continues that the insurance will cover [facility] or CONTOUR.    Attempted to reach pt. Left a voicemail to call us  back.

## 2024-04-06 NOTE — Telephone Encounter (Unsigned)
 Copied from CRM [id number]. Topic: Clinical - Prescription Issue >> Mar 27, 2024  9:06 AM Jenna Moan wrote: Reason for CRM: Patient said that her insurance pays for the medication FREESTYLE PRECISION NEO TEST test strip but it was denied and the pharmacy will be sending a request again and patient will like a call back that its been approved >> Apr 06, 2024  9:20 AM Allyne Areola wrote: Patient is calling to follow up on pre-auth for  FREESTYLE PRECISION NEO TEST Strips. She would like to speak with whoever handles the PA.  >> Apr 03, 2024  9:30 AM Mesmerise C wrote: Patient stated she spoke to [facility] in regards to her coverage of her FREESTYLE PRECISION NEO TEST test strips and was told it has to be filed for a formulary exception/medical strips in order for it to be paid for and to have a 72 hour expedite said if not able to to call them at [phone] patient would like a call back once it is done at [phone]

## 2024-04-06 NOTE — Telephone Encounter (Signed)
 Spoke to pt. Relay below message to pt. Pt cut off in the middle of explanation. Pt said she spoke to her insurance- they would cover FREESTYLE TEST test strip. She continues that her insurance requires provider to fill out special form. Pt goes on about her seeing this practice for 26 yrs and paying insurance and want to get the strip if it is covered by insurance instead of paying $11 for strip. Pt request to speak with Crissy, manager, as she had spoke to her before about injection med issues. Pt mentioned she is on freestyle sensor and contour metric.   Apologize pt for cutting her off as this cma about to grab pt. Inform pt, this cma will let Crissy know about her request to speak with her and ask pt to reach out to her insurance about the special form for provider to fill out as our office did not receive it. Pt verbalized understanding.   Forwarding to [employer], Tree surgeon. Please advise.

## 2024-04-09 NOTE — Telephone Encounter (Signed)
Spoke to pt. Please see phone encounter.

## 2024-04-09 NOTE — Telephone Encounter (Signed)
 Spoke with patient via telephone regarding the need for a Formulary Exception form completed in order for her [employer] insurance to cover her Freestyle Test strips. Patient was able to give me a telephone number of [phone] to call to get needed forms. Advised patient that information will be gathered and given to Dr. Ozell and will follow up. Patient voiced understanding.

## 2024-04-11 ENCOUNTER — Telehealth: Payer: Self-pay | Admitting: *Deleted

## 2024-04-11 ENCOUNTER — Other Ambulatory Visit (HOSPITAL_COMMUNITY): Payer: Self-pay

## 2024-04-11 NOTE — Telephone Encounter (Signed)
 See prior phone note-pharmacy informed.

## 2024-04-11 NOTE — Telephone Encounter (Signed)
 Pharmacy Patient Advocate Encounter  Received notification from [facility] that Prior Authorization for FREESTYLE PRECISION NEO TEST STRIP has been APPROVED from 04/09/24 to 04/09/25. Ran test claim, Copay is $6.38. This test claim was processed through [facility]- copay amounts may vary at other pharmacies due to pharmacy/plan contracts, or as the patient moves through the different stages of their insurance plan.

## 2024-04-11 NOTE — Telephone Encounter (Signed)
Spoke with Katrina at [facility] and informed her of the approval as below.

## 2024-04-11 NOTE — Telephone Encounter (Signed)
 Copied from CRM [id number]. Topic: General - Other >> Apr 10, 2024  8:57 AM Burnard DEL wrote: Reason for CRM: [employer] called in to let provider know that patient insurance approved FREESTYLE PRECISION NEO TEST test strip for a year. Documentation will be sent over to office as well

## 2024-04-13 ENCOUNTER — Telehealth: Payer: Self-pay

## 2024-04-13 NOTE — Telephone Encounter (Signed)
 Copied from CRM [id number]. Topic: Clinical - Medication Question >> Apr 13, 2024  9:47 AM Deaijah H wrote: Reason for CRM: Patient called in to send message to [employer] regarding freestyle libre strip would like to be contacted with a follow up. Please call [phone]

## 2024-04-13 NOTE — Telephone Encounter (Signed)
 Spoke with Rebecca Paul at [facility] and she stated the Rx is ready and the patient was sent a text message with this information.

## 2024-04-15 ENCOUNTER — Other Ambulatory Visit: Payer: Self-pay | Admitting: Family Medicine

## 2024-04-15 DIAGNOSIS — I10 Essential (primary) hypertension: Secondary | ICD-10-CM

## 2024-04-17 ENCOUNTER — Telehealth: Payer: Self-pay | Admitting: Cardiology

## 2024-04-17 DIAGNOSIS — E785 Hyperlipidemia, unspecified: Secondary | ICD-10-CM

## 2024-04-17 MED ORDER — ROSUVASTATIN CALCIUM 20 MG PO TABS: 20.0000 mg | ORAL_TABLET | Freq: Every day | ORAL | 3 refills | Status: AC

## 2024-04-17 NOTE — Telephone Encounter (Signed)
*  STAT* If patient is at the pharmacy, call can be transferred to refill team.   1. Which medications need to be refilled? (please list name of each medication and dose if known) rosuvastatin  (CRESTOR ) 20 MG tablet    2. Would you like to learn more about the convenience, safety, & potential cost savings by using the [facility]?    3. Are you open to using the [facility] (Type [facility].  ).   4. Which pharmacy/location (including street and city if local pharmacy) is medication to be sent to? [facility] [address] [place] - [address]    5. Do they need a 30 day or 90 day supply? 90 day

## 2024-04-17 NOTE — Telephone Encounter (Signed)
 Pt's medication was sent to pt's pharmacy as requested. Confirmation received.

## 2024-04-25 ENCOUNTER — Ambulatory Visit: Admitting: Dietician

## 2024-04-26 ENCOUNTER — Ambulatory Visit: Admitting: Dietician

## 2024-05-03 ENCOUNTER — Ambulatory Visit: Admitting: Pharmacist

## 2024-05-22 ENCOUNTER — Ambulatory Visit: Admitting: Pharmacist

## 2024-05-29 ENCOUNTER — Other Ambulatory Visit

## 2024-05-31 ENCOUNTER — Other Ambulatory Visit: Payer: Self-pay | Admitting: Family Medicine

## 2024-05-31 DIAGNOSIS — E1165 Type 2 diabetes mellitus with hyperglycemia: Secondary | ICD-10-CM

## 2024-06-04 ENCOUNTER — Ambulatory Visit: Admitting: Dietician

## 2024-06-04 ENCOUNTER — Ambulatory Visit: Admitting: Nutrition

## 2024-06-05 ENCOUNTER — Encounter: Attending: Family Medicine | Admitting: Nutrition

## 2024-06-05 VITALS — Ht 61.0 in | Wt 148.0 lb

## 2024-06-05 DIAGNOSIS — E1165 Type 2 diabetes mellitus with hyperglycemia: Secondary | ICD-10-CM | POA: Diagnosis not present

## 2024-06-05 NOTE — Progress Notes (Unsigned)
 Patient is here today because she wants to come off Metformin  and get her blood sugars down. SBGM:  on Libre 3 going to reader.  Suggested phone so we can view the reading and she can see more data-like continuous glucoses during night and day.  Many questions answered about difference between sensor readings and blood readings, how to use the reader and goals for pre and post meal readings. Medications:  Metformin , declined ozempic.  Wants to be off Metformin  even though she has been on it for 4 years.   Exercise:  has not started this.  [place] wants to do this.  As joined a gym and says she wants to go 4 days/wk. Diet:  has no knowledge of how foods raise blood sugar and how much she needs .  Meals are not regular, and eats 2X/day usually.  Has hs snack most nights.  Last night: ice cream with syrup.  Just smiled when I asked her how much she had. Diet: Typical day:   8AM:  up coffee with 3 sweetened creamers.   8-10 water

## 2024-06-07 DIAGNOSIS — M1712 Unilateral primary osteoarthritis, left knee: Secondary | ICD-10-CM | POA: Diagnosis not present

## 2024-06-07 DIAGNOSIS — M5451 Vertebrogenic low back pain: Secondary | ICD-10-CM | POA: Diagnosis not present

## 2024-06-08 ENCOUNTER — Ambulatory Visit: Admitting: Family Medicine

## 2024-06-08 NOTE — Patient Instructions (Signed)
 Check blood sugar before and 2hr. After meals Exercise for 30-40 minutes 4-5 days/wk. If blood sugar is over 180 2hr after pancakes, switch to log cabin sugar free syrup, and limit pancakes to 2 small ones with no grits.

## 2024-06-14 ENCOUNTER — Ambulatory Visit: Admitting: Family Medicine

## 2024-06-25 ENCOUNTER — Other Ambulatory Visit: Payer: Self-pay | Admitting: Family Medicine

## 2024-06-25 DIAGNOSIS — E119 Type 2 diabetes mellitus without complications: Secondary | ICD-10-CM

## 2024-06-28 ENCOUNTER — Ambulatory Visit (INDEPENDENT_AMBULATORY_CARE_PROVIDER_SITE_OTHER): Admitting: Family Medicine

## 2024-06-28 ENCOUNTER — Encounter: Payer: Self-pay | Admitting: Family Medicine

## 2024-06-28 VITALS — BP 116/70 | HR 74 | Temp 98.3°F | Wt 148.1 lb

## 2024-06-28 DIAGNOSIS — I10 Essential (primary) hypertension: Secondary | ICD-10-CM

## 2024-06-28 DIAGNOSIS — E1165 Type 2 diabetes mellitus with hyperglycemia: Secondary | ICD-10-CM | POA: Diagnosis not present

## 2024-06-28 DIAGNOSIS — Z7984 Long term (current) use of oral hypoglycemic drugs: Secondary | ICD-10-CM | POA: Diagnosis not present

## 2024-06-28 MED ORDER — AMLODIPINE BESYLATE 5 MG PO TABS
5.0000 mg | ORAL_TABLET | Freq: Every day | ORAL | 1 refills | Status: DC
Start: 2024-06-28 — End: 2024-07-23

## 2024-06-28 NOTE — Progress Notes (Signed)
 Established Patient Office Visit  Subjective   Patient ID: Rebecca Paul, female    DOB: [birth year]  Age: 68 y.o. MRN: [id number]  Chief Complaint  Patient presents with   Medical Management of Chronic Issues    Pt is here for follow up on DM, she would like the A1C blood test done today. She reports she loved meeting with the dietician, is going to go back for additional education and follow, goal is to be off metformin  and only control with diet.   Has mammogram set up for November at [facility].      Current Outpatient Medications  Medication Instructions   amLODipine  (NORVASC ) 5 mg, Oral, Daily   aspirin  EC 81 mg, Oral, Daily, Swallow whole.   Cholecalciferol (VITAMIN D3) 1000 units CAPS Take by mouth.   co-enzyme Q-10 30 mg, 3 times daily   Continuous Glucose Sensor (FREESTYLE LIBRE 3 PLUS SENSOR) MISC Change sensor every 15 days.   ezetimibe  (ZETIA ) 10 mg, Oral, Daily   famciclovir  (FAMVIR ) 250 mg, Oral, 2 times daily PRN   fluticasone  (FLONASE ) 50 MCG/ACT nasal spray 2 sprays, Each Nare, Daily   FREESTYLE PRECISION NEO TEST test strip Use as instructed once a day   FREESTYLE PRECISION NEO TEST test strip USE 1 STRIP TO CHECK GLUCOSE TWICE DAILY   metFORMIN  (GLUCOPHAGE ) 500 mg, Oral, 2 times daily with meals   rosuvastatin  (CRESTOR ) 20 mg, Oral, Daily   triamcinolone  cream (KENALOG ) 0.1 % 1 Application, Topical, 2 times daily   valsartan  (DIOVAN ) 80 mg, Oral, 2 times daily    Patient Active Problem List   Diagnosis Date Noted   Seasonal allergies 11/03/2023   Mild CAD 11/25/2021   Hyperlipidemia 11/25/2021   Osteopenia 08/25/2020   Vitamin D  deficiency 11/20/2019   Hyperparathyroidism (HCC) 11/20/2019   Hypercalcemia 11/20/2019   DM II (diabetes mellitus, type II), controlled (HCC) 04/09/2019   Backache 07/06/2010   Essential hypertension 04/22/2009   NEPHROLITHIASIS 10/30/2007     Review of Systems  All other systems reviewed and are  negative.     Objective:     BP 116/70   Pulse 74   Temp 98.3 F (36.8 C) (Oral)   Wt 148 lb 1.6 oz (67.2 kg)   SpO2 95%   BMI 27.98 kg/m    Physical Exam Vitals reviewed.  Constitutional:      Appearance: Normal appearance. She is well-groomed and overweight.  Cardiovascular:     Rate and Rhythm: Normal rate and regular rhythm.     Heart sounds: S1 normal and S2 normal.  Pulmonary:     Effort: Pulmonary effort is normal.     Breath sounds: Normal air entry.  Musculoskeletal:     Right lower leg: No edema.     Left lower leg: No edema.  Neurological:     Mental Status: She is alert and oriented to person, place, and time. Mental status is at baseline.     Gait: Gait is intact.  Psychiatric:        Mood and Affect: Mood and affect normal.        Speech: Speech normal.        Behavior: Behavior normal.        Judgment: Judgment normal.      No results found for any visits on 06/28/24.    The ASCVD Risk score (Arnett DK, et al., 2019) failed to calculate for the following reasons:   The valid total cholesterol range  is 130 to 320 mg/dL    Assessment & Plan:  Controlled type 2 diabetes mellitus with hyperglycemia, without long-term current use of insulin (HCC) -     Hemoglobin A1c; Future   On metformin  500 mg BID, continue as prescribed. , has CGM and is getting dietary counseling from the dietician.   No follow-ups on file.    Heron CHRISTELLA Sharper, MD

## 2024-06-29 ENCOUNTER — Ambulatory Visit: Payer: Self-pay | Admitting: Family Medicine

## 2024-06-29 LAB — HEMOGLOBIN A1C: Hgb A1c MFr Bld: 6.7 % — ABNORMAL HIGH (ref 4.6–6.5)

## 2024-07-12 DIAGNOSIS — K080 Exfoliation of teeth due to systemic causes: Secondary | ICD-10-CM | POA: Diagnosis not present

## 2024-07-22 ENCOUNTER — Other Ambulatory Visit: Payer: Self-pay | Admitting: Family Medicine

## 2024-07-22 DIAGNOSIS — I10 Essential (primary) hypertension: Secondary | ICD-10-CM

## 2024-07-26 ENCOUNTER — Ambulatory Visit: Admitting: Dermatology

## 2024-07-26 ENCOUNTER — Other Ambulatory Visit: Payer: Self-pay | Admitting: Family Medicine

## 2024-07-26 DIAGNOSIS — E1165 Type 2 diabetes mellitus with hyperglycemia: Secondary | ICD-10-CM

## 2024-08-22 ENCOUNTER — Ambulatory Visit (INDEPENDENT_AMBULATORY_CARE_PROVIDER_SITE_OTHER)

## 2024-08-22 VITALS — BP 116/70 | HR 76 | Ht 61.0 in | Wt 148.0 lb

## 2024-08-22 DIAGNOSIS — Z0000 Encounter for general adult medical examination without abnormal findings: Secondary | ICD-10-CM

## 2024-08-22 NOTE — Progress Notes (Signed)
 Subjective:   Rebecca Paul is a 68 y.o. female who presents for a Medicare Annual Wellness Visit. I connected with  Rebecca Paul on 08/22/24 by a audio enabled telemedicine application and verified that I am speaking with the correct person using two identifiers.  Patient Location: Home  Provider Location: Home Office  I discussed the limitations of evaluation and management by telemedicine. The patient expressed understanding and agreed to proceed.   Allergies (verified) Dust mite extract and Lisinopril    History: Past Medical History:  Diagnosis Date   Allergy    more sinus issues    Anemia    yrs past as child , with pregnancy    Arthritis    shoulders - not diagnosed    Headache(784.0)    Hyperlipidemia    Hyperparathyroidism 07/28/2021   Hypertension    Kidney stone    Non-obstructive CAD    a. coronary CTA in 09/2021 showed a coronary calcium  score of 92 (86th percentile for age and sex) and mild non-obstructive CAD   Osteopenia    Plantar fasciitis    Type 2 diabetes mellitus (HCC)    Past Surgical History:  Procedure Laterality Date   ABDOMINAL HYSTERECTOMY     fibroids   CESAREAN SECTION     COLONOSCOPY  01/2018   normal   OOPHORECTOMY     dermoid tumor   TONSILLECTOMY AND ADENOIDECTOMY     Family History  Problem Relation Age of Onset   Cancer Father        prostate   Hypertension Mother    Hyperlipidemia Mother    Thyroid  disease Mother    Diabetes Mother    Obesity Sister    Diabetes Sister    Diabetes Other    Hyperlipidemia Other    Hypertension Other    Cancer Other        prostate   Kidney disease Other    Obesity Sister    Heart attack Sister    Diabetes Maternal Grandmother    Colon cancer Neg Hx    Colon polyps Neg Hx    Rectal cancer Neg Hx    Stomach cancer Neg Hx    Social History   Occupational History   Not on file  Tobacco Use   Smoking status: Never   Smokeless tobacco: Never  Vaping Use   Vaping status:  Never Used  Substance and Sexual Activity   Alcohol use: Yes    Comment: rare glass of wine    Drug use: No   Sexual activity: Not Currently   Tobacco Counseling Counseling given: Yes  SDOH Screenings   Food Insecurity: No Food Insecurity (08/22/2024)  Housing: Unknown (08/22/2024)  Transportation Needs: No Transportation Needs (08/22/2024)  Utilities: Not At Risk (08/22/2024)  Alcohol Screen: Low Risk  (02/13/2024)  Depression (PHQ2-9): Low Risk  (08/22/2024)  Financial Resource Strain: Low Risk  (02/13/2024)  Physical Activity: Insufficiently Active (08/22/2024)  Social Connections: Moderately Integrated (08/22/2024)  Stress: No Stress Concern Present (08/22/2024)  Tobacco Use: Low Risk  (08/22/2024)  Health Literacy: Adequate Health Literacy (08/22/2024)   Depression Screen    08/22/2024    3:21 PM 02/13/2024    9:18 AM 06/22/2023   10:41 AM 01/27/2023   12:55 PM 12/16/2022    1:22 PM 11/30/2022    4:04 PM 09/20/2022    3:44 PM  PHQ 2/9 Scores  PHQ - 2 Score 0 0 0 0 0 0 0  PHQ- 9 Score  0 0 0 0     Goals Addressed               This Visit's Progress     Increase physical activity (pt-stated)   On track     Maintain good health.       Visit info / Clinical Intake: Medicare Wellness Visit Type:: Subsequent Annual Wellness Visit Medicare Wellness Visit Mode:: Telephone If telephone:: video declined If telephone or video:: vitals recorded from last visit Interpreter Needed?: No Pre-visit prep was completed: yes AWV questionnaire completed by patient prior to visit?: no Living arrangements:: (!) lives alone Patient's Overall Health Status Rating: very good Typical amount of pain: none Does pain affect daily life?: no Are you currently prescribed opioids?: no  Dietary Habits and Nutritional Risks How many meals a day?: 3 Eats fruit and vegetables daily?: yes Most meals are obtained by: preparing own meals Diabetic:: (!) yes Any non-healing wounds?: no How often do  you check your BS?: continuous glucose monitor Would you like to be referred to a Nutritionist or for Diabetic Management? : no  Functional Status Activities of Daily Living (to include ambulation/medication): Independent Ambulation: Independent Medication Administration: Independent Home Management: Independent Manage your own finances?: yes Primary transportation is: driving Concerns about hearing?: no  Fall Screening Falls in the past year?: 0 Number of falls in past year: 0 Was there an injury with Fall?: 0 Fall Risk Category Calculator: 0 Patient Fall Risk Level: Low Fall Risk  Fall Risk Patient at Risk for Falls Due to: No Fall Risks Fall risk Follow up: Falls evaluation completed; Education provided  Home and Transportation Safety: All rugs have non-skid backing?: yes All stairs or steps have railings?: yes Grab bars in the bathtub or shower?: (!) no Have non-skid surface in bathtub or shower?: yes Good home lighting?: yes Regular seat belt use?: yes Hospital stays in the last year:: no  Cognitive Assessment Difficulty concentrating, remembering, or making decisions? : no Will 6CIT or Mini Cog be Completed: yes What year is it?: 0 points What month is it?: 0 points Give patient an address phrase to remember (5 components): [address] [place], [place] About what time is it?: 0 points Count backwards from 20 to 1: 0 points Say the months of the year in reverse: 0 points Repeat the address phrase from earlier: 2 points 6 CIT Score: 2 points  Advance Directives (For Healthcare) Does Patient Have a Medical Advance Directive?: No Would patient like information on creating a medical advance directive?: Yes (MAU/Ambulatory/Procedural Areas - Information given) (pt will pick at the office pt aware)  Reviewed/Updated  Reviewed/Updated: All; Medical History; Surgical History; Family History; Medications; Allergies; Care Teams; Patient Goals        Objective:    Today's  Vitals   08/22/24 1512  BP: 116/70  Pulse: 76  Weight: 148 lb (67.1 kg)  Height: 5' 1 (1.549 m)   Body mass index is 27.96 kg/m.  Current Medications (verified) Outpatient Encounter Medications as of 08/22/2024  Medication Sig   amLODipine  (NORVASC ) 5 MG tablet Take 1 tablet by mouth once daily   aspirin  EC 81 MG tablet Take 1 tablet (81 mg total) by mouth daily. Swallow whole.   Cholecalciferol (VITAMIN D3) 1000 units CAPS Take by mouth.   co-enzyme Q-10 30 MG capsule Take 30 mg by mouth 3 (three) times daily.   Continuous Glucose Sensor (FREESTYLE LIBRE 3 PLUS SENSOR) MISC Change sensor every 15 days.   ezetimibe  (  ZETIA ) 10 MG tablet Take 1 tablet (10 mg total) by mouth daily.   famciclovir  (FAMVIR ) 250 MG tablet Take 1 tablet (250 mg total) by mouth 2 (two) times daily as needed.   fluticasone  (FLONASE ) 50 MCG/ACT nasal spray Place 2 sprays into both nostrils daily.   FREESTYLE PRECISION NEO TEST test strip Use as instructed once a day   FREESTYLE PRECISION NEO TEST test strip USE 1 STRIP TO CHECK GLUCOSE TWICE DAILY   metFORMIN  (GLUCOPHAGE ) 500 MG tablet TAKE 1 TABLET BY MOUTH TWICE DAILY WITH A MEAL   rosuvastatin  (CRESTOR ) 20 MG tablet Take 1 tablet (20 mg total) by mouth daily.   triamcinolone  cream (KENALOG ) 0.1 % Apply 1 Application topically 2 (two) times daily.   valsartan  (DIOVAN ) 80 MG tablet Take 1 tablet (80 mg total) by mouth 2 (two) times daily.   No facility-administered encounter medications on file as of 08/22/2024.   Hearing/Vision screen Hearing Screening - Comments:: Pt have no hearing dif Vision Screening - Comments:: Pt wear glasses/contacts--pt goes to [facility] Immunizations and Health Maintenance Health Maintenance  Topic Date Due   COVID-19 Vaccine (7 - 2025-26 season) 06/18/2024   Mammogram  07/19/2024   OPHTHALMOLOGY EXAM  12/13/2024   HEMOGLOBIN A1C  12/26/2024   Diabetic kidney evaluation - eGFR measurement  02/12/2025   Diabetic  kidney evaluation - Urine ACR  02/12/2025   FOOT EXAM  02/12/2025   Medicare Annual Wellness (AWV)  08/22/2025   Colonoscopy  01/24/2028   DTaP/Tdap/Td (4 - Td or Tdap) 03/01/2034   Pneumococcal Vaccine: 50+ Years  Completed   Influenza Vaccine  Completed   DEXA SCAN  Completed   Hepatitis C Screening  Completed   Zoster Vaccines- Shingrix   Completed   Meningococcal B Vaccine  Aged Out        Assessment/Plan:  This is a routine wellness examination for Taressa.  Patient Care Team: Ozell Heron HERO, MD as PCP - General (Family Medicine) Tobb, Kardie, DO as PCP - Cardiology (Cardiology) Helga Slice, MD as Attending Physician (Dermatology) [facility], [facility] Cy BROCKS, RD as Dietitian [facility] Medicine)  I have personally reviewed and noted the following in the patient's chart:   Medical and social history Use of alcohol, tobacco or illicit drugs  Current medications and supplements including opioid prescriptions. Functional ability and status Nutritional status Physical activity Advanced directives List of other physicians Hospitalizations, surgeries, and ER visits in previous 12 months Vitals Screenings to include cognitive, depression, and falls Referrals and appointments  No orders of the defined types were placed in this encounter.  In addition, I have reviewed and discussed with patient certain preventive protocols, quality metrics, and best practice recommendations. A written personalized care plan for preventive services as well as general preventive health recommendations were provided to patient.   Ozie Ned, CMA   08/22/2024   Return in 1 year (on 08/22/2025).  After Visit Summary: (MyChart) Due to this being a telephonic visit, the after visit summary with patients personalized plan was offered to patient via MyChart   Nurse Notes: pt is aware and due Mammogram--schedule for 11/25

## 2024-08-27 DIAGNOSIS — L658 Other specified nonscarring hair loss: Secondary | ICD-10-CM | POA: Diagnosis not present

## 2024-08-28 DIAGNOSIS — N760 Acute vaginitis: Secondary | ICD-10-CM | POA: Diagnosis not present

## 2024-08-28 DIAGNOSIS — Z01419 Encounter for gynecological examination (general) (routine) without abnormal findings: Secondary | ICD-10-CM | POA: Diagnosis not present

## 2024-08-28 DIAGNOSIS — Z1231 Encounter for screening mammogram for malignant neoplasm of breast: Secondary | ICD-10-CM | POA: Diagnosis not present

## 2024-08-28 DIAGNOSIS — Z6828 Body mass index (BMI) 28.0-28.9, adult: Secondary | ICD-10-CM | POA: Diagnosis not present

## 2024-08-28 DIAGNOSIS — M8588 Other specified disorders of bone density and structure, other site: Secondary | ICD-10-CM | POA: Diagnosis not present

## 2024-08-28 DIAGNOSIS — Z124 Encounter for screening for malignant neoplasm of cervix: Secondary | ICD-10-CM | POA: Diagnosis not present

## 2024-09-04 ENCOUNTER — Encounter: Admitting: Nutrition

## 2024-09-11 ENCOUNTER — Telehealth: Payer: Self-pay | Admitting: Pharmacist

## 2024-09-11 NOTE — Telephone Encounter (Signed)
 Pt has questions for Chris Pavero about medication management. Pt scheduled with EMERSON Crews 12/5, but still wanted to speak with [place] if possible. Please advise.   Pt c/o medication issue:  1. Name of Medication: ezetimibe  (ZETIA ) 10 MG tablet (Expired)   valsartan  (DIOVAN ) 80 MG tablet    2. How are you currently taking this medication (dosage and times per day)? As prescribed   3. Are you having a reaction (difficulty breathing--STAT)? No   4. What is your medication issue? Pt feels medicine is not helping.

## 2024-09-11 NOTE — Telephone Encounter (Signed)
 Contacted patient. Reports BP does not seem stable.   Recent Home readings:  11/23: 139/77 evening 11/23: 145/80, 148/87 in morning 11/20: 148/78, 149/74  Also reports past readings this month of 115/68, 115/69,  and 120/66 bu did not give dates.   Stopped drinking sodas about 1 month ago. Uses seasoning salt.  Currently on valsartan  80mg  BID and amlodipine  5mg  once daily in the morning.   Advised for patient to take valsartan  and amlodipine  when she wakes up and check BP about 1-2 hours later. Would like to still hav a cup of coffee in the morning. Let her know that was allowed. Patient will bring in readings to next visit on 12/5.

## 2024-09-15 ENCOUNTER — Other Ambulatory Visit: Payer: Self-pay | Admitting: Family Medicine

## 2024-09-15 DIAGNOSIS — E1165 Type 2 diabetes mellitus with hyperglycemia: Secondary | ICD-10-CM

## 2024-09-21 ENCOUNTER — Ambulatory Visit: Admitting: Pharmacist

## 2024-09-21 ENCOUNTER — Other Ambulatory Visit: Payer: Self-pay | Admitting: Family Medicine

## 2024-09-21 DIAGNOSIS — E119 Type 2 diabetes mellitus without complications: Secondary | ICD-10-CM

## 2024-11-04 ENCOUNTER — Other Ambulatory Visit: Payer: Self-pay | Admitting: Cardiology

## 2024-11-08 NOTE — Telephone Encounter (Signed)
 Lipid Panel Completed on 02/13/24

## 2024-11-12 ENCOUNTER — Ambulatory Visit: Admitting: Pharmacist

## 2024-11-21 ENCOUNTER — Ambulatory Visit: Admitting: Pharmacist

## 2024-11-21 VITALS — BP 116/72 | HR 74

## 2024-11-21 DIAGNOSIS — I10 Essential (primary) hypertension: Secondary | ICD-10-CM

## 2024-11-21 NOTE — Assessment & Plan Note (Signed)
 Assessment: Blood pressure well-controlled in clinic today and at home No signs or symptoms of hypotension When she finishes moving she plans to resume her normal exercise routine and add weightbearing exercises  Plan: Continue valsartan  80 mg and amlodipine  milligrams daily Follow-up as needed

## 2024-11-21 NOTE — Patient Instructions (Signed)
 Your blood pressure goal is < 130/27mmHg   Continue valsartan  80mg  twice a day and amlodipine  5mg  daily  Important lifestyle changes to control high blood pressure  Intervention  Effect on the BP   Weight loss Weight loss is one of the most effective lifestyle changes for controlling blood pressure. If you're overweight or obese, losing even a small amount of weight can help reduce blood pressure.    Blood pressure can decrease by 1 millimeter of mercury (mmHg) with each kilogram (about 2.2 pounds) of weight lost.   Exercise regularly As a general goal, aim for 30 minutes of moderate physical activity every day.    Regular physical activity can lower blood pressure by 5 - 8 mmHg.   Eat a healthy diet Eat a diet rich in whole grains, fruits, vegetables, lean meat, and low-fat dairy products. Limit processed foods, saturated fat, and sweets.    A heart-healthy diet can lower high blood pressure by 10 mmHg.   Reduce salt (sodium) in your diet Aim for 000mg  of sodium each day. Avoid deli meats, canned food, and frozen microwave meals which are high in sodium.     Limiting sodium can reduce blood pressure by 5 mmHg.   Limit alcohol One drink equals 12 ounces of beer, 5 ounces of wine, or 1.5 ounces of 80-proof liquor.    Limiting alcohol to < 1 drink a day for women or < 2 drinks a day for men can help lower blood pressure by about 4 mmHg.   To check your pressure at home you will need to:   Sit up in a chair, with feet flat on the floor and back supported. Do not cross your ankles or legs. Rest your left arm so that the cuff is about heart level. If the cuff goes on your upper arm, then just relax your arm on the table, arm of the chair, or your lap. If you have a wrist cuff, hold your wrist against your chest at heart level. Place the cuff snugly around your arm, about 1 inch above the crease of your elbow. The cords should be inside the groove of your elbow.  Sit quietly,  with the cuff in place, for about 5 minutes. Then press the power button to start a reading. Do not talk or move while the reading is taking place.  Record your readings on a sheet of paper. Although most cuffs have a memory, it is often easier to see a pattern developing when the numbers are all in front of you.  You can repeat the reading after 1-3 minutes if it is recommended.   Make sure your bladder is empty and you have not had caffeine or tobacco within the last 30 minutes   Always bring your blood pressure log with you to your appointments. If you have not brought your monitor in to be double checked for accuracy, please bring it to your next appointment.   You can find a list of validated (accurate) blood pressure cuffs at: validatebp.org

## 2024-11-21 NOTE — Progress Notes (Signed)
 Patient ID: Rebecca Paul                 DOB: 1956-04-24                      MRN: [id number]      HPI: Rebecca Paul is a 69 y.o. female referred by Dr. Sheena to HTN clinic. PMH is significant for HTN, DM, CAD per CT, HLD.  Patient was seen by Dr. Sheena 03/14/24. Was referred to PharmD. Contacted Chris in Nov about higher readings in the 140's. No changes were made. She was supposed to see me Dec 5.   Patient presents today to clinic. She brings in her home BP cuff and list of readings. Checking in AM.  Her home cuff was found to be accurate.  She is checking blood pressure 3 times, discarding the first reading and trying to average the second 2.  Blood pressures over the last week or so have been well-controlled.  She admits she has been off schedule with her exercise due to moving.  But very busy moving things from 1 house to another.  Denies any significant dizziness or lightheadedness.  Equate home cuff: 121/71, 120/73    Current HTN meds: valsartan  80mg  BID and amlodipine  5mg  once daily in the morning.  Previously tried: lisinopril  (cough) losartan  BP goal: <130/80  Family History:  Family History  Problem Relation Age of Onset   Cancer Father        prostate   Hypertension Mother    Hyperlipidemia Mother    Thyroid  disease Mother    Diabetes Mother    Obesity Sister    Diabetes Sister    Diabetes Other    Hyperlipidemia Other    Hypertension Other    Cancer Other        prostate   Kidney disease Other    Obesity Sister    Heart attack Sister    Diabetes Maternal Grandmother    Colon cancer Neg Hx    Colon polyps Neg Hx    Rectal cancer Neg Hx    Stomach cancer Neg Hx      Social History:  Social History   Socioeconomic History   Marital status: Widowed    Spouse name: Not on file   Number of children: Not on file   Years of education: Not on file   Highest education level: Bachelor's degree (e.g., BA, AB, BS)  Occupational History   Not on file   Tobacco Use   Smoking status: Never   Smokeless tobacco: Never  Vaping Use   Vaping status: Never Used  Substance and Sexual Activity   Alcohol use: Yes    Comment: rare glass of wine    Drug use: No   Sexual activity: Not Currently  Other Topics Concern   Not on file  Social History Narrative   Not on file   Social Drivers of Health   Tobacco Use: Low Risk (08/27/2024)   Received from [facility]   Patient History    Smoking Tobacco Use: Never    Smokeless Tobacco Use: Never    Passive Exposure: Not on file  Financial Resource Strain: Low Risk (02/13/2024)   Overall Financial Resource Strain (CARDIA)    Difficulty of Paying Living Expenses: Not hard at all  Food Insecurity: No Food Insecurity (08/22/2024)   Epic    Worried About Radiation Protection Practitioner of Food in the Last Year: Never true  Ran Out of Food in the Last Year: Never true  Transportation Needs: No Transportation Needs (08/22/2024)   Epic    Lack of Transportation (Medical): No    Lack of Transportation (Non-Medical): No  Physical Activity: Insufficiently Active (08/22/2024)   Exercise Vital Sign    Days of Exercise per Week: 3 days    Minutes of Exercise per Session: 30 min  Stress: No Stress Concern Present (08/22/2024)   [employer] of Occupational Health - Occupational Stress Questionnaire    Feeling of Stress: Not at all  Social Connections: Moderately Integrated (08/22/2024)   Social Connection and Isolation Panel    Frequency of Communication with Friends and Family: More than three times a week    Frequency of Social Gatherings with Friends and Family: More than three times a week    Attends Religious Services: More than 4 times per year    Active Member of [employer] or Organizations: Yes    Attends Banker Meetings: More than 4 times per year    Marital Status: Widowed  Intimate Partner Violence: Not At Risk (08/22/2024)   Epic    Fear of Current or Ex-Partner: No    Emotionally Abused: No     Physically Abused: No    Sexually Abused: No  Depression (PHQ2-9): Low Risk (08/22/2024)   Depression (PHQ2-9)    PHQ-2 Score: 0  Alcohol Screen: Low Risk (02/13/2024)   Alcohol Screen    Last Alcohol Screening Score (AUDIT): 1  Housing: Unknown (08/22/2024)   Epic    Unable to Pay for Housing in the Last Year: No    Number of Times Moved in the Last Year: Not on file    Homeless in the Last Year: No  Utilities: Not At Risk (08/22/2024)   Epic    Threatened with loss of utilities: No  Health Literacy: Adequate Health Literacy (08/22/2024)   B1300 Health Literacy    Frequency of need for help with medical instructions: Never    Diet: 1 cup of coffee  Exercise:  Treadmill, bike- plans to start weight baring exercises  Home BP readings:  Date SBP/DBP  HR  2/4 132/70   2/4 132/69   2/2 125/77, 131/68   2/1 112/64   2/1 124/67   1/30 111/69, 108/60   1/29 120/78, 121/70, 100/65   Average      Wt Readings from Last 3 Encounters:  08/22/24 148 lb (67.1 kg)  06/28/24 148 lb 1.6 oz (67.2 kg)  06/05/24 148 lb (67.1 kg)   BP Readings from Last 3 Encounters:  11/21/24 116/72  08/22/24 116/70  06/28/24 116/70   Pulse Readings from Last 3 Encounters:  11/21/24 74  08/22/24 76  06/28/24 74    Renal function: CrCl cannot be calculated (Patient's most recent lab result is older than the maximum 21 days allowed.).  Past Medical History:  Diagnosis Date   Allergy    more sinus issues    Anemia    yrs past as child , with pregnancy    Arthritis    shoulders - not diagnosed    Headache(784.0)    Hyperlipidemia    Hyperparathyroidism 07/28/2021   Hypertension    Kidney stone    Non-obstructive CAD    a. coronary CTA in 09/2021 showed a coronary calcium  score of 92 (86th percentile for age and sex) and mild non-obstructive CAD   Osteopenia    Plantar fasciitis    Type 2 diabetes mellitus (HCC)  Medications Ordered Prior to Encounter[1]  Allergies[2]  Blood  pressure 116/72, pulse 74.   Assessment/Plan:     1. Hypertension -  Essential hypertension Assessment: Blood pressure well-controlled in clinic today and at home No signs or symptoms of hypotension When she finishes moving she plans to resume her normal exercise routine and add weightbearing exercises  Plan: Continue valsartan  80 mg and amlodipine  milligrams daily Follow-up as needed      Thank you  Adileny Delon D Mubashir Mallek, Pharm.JONETTA SARAN, CPP [facility] [address] [place], [place] [address]  Phone: [phone]; Fax: [phone]            [1]  Current Outpatient Medications on File Prior to Visit  Medication Sig Dispense Refill   amLODipine  (NORVASC ) 5 MG tablet Take 1 tablet by mouth once daily 90 tablet 0   aspirin  EC 81 MG tablet Take 1 tablet (81 mg total) by mouth daily. Swallow whole. 30 tablet 12   Cholecalciferol (VITAMIN D3) 1000 units CAPS Take by mouth.     ezetimibe  (ZETIA ) 10 MG tablet Take 1 tablet by mouth once daily 90 tablet 1   famciclovir  (FAMVIR ) 250 MG tablet Take 1 tablet (250 mg total) by mouth 2 (two) times daily as needed. 60 tablet 1   fluticasone  (FLONASE ) 50 MCG/ACT nasal spray Place 2 sprays into both nostrils daily. 16 g 6   metFORMIN  (GLUCOPHAGE ) 500 MG tablet TAKE 1 TABLET BY MOUTH TWICE DAILY WITH A MEAL 180 tablet 0   rosuvastatin  (CRESTOR ) 20 MG tablet Take 1 tablet (20 mg total) by mouth daily. 90 tablet 3   triamcinolone  cream (KENALOG ) 0.1 % Apply 1 Application topically 2 (two) times daily. 45 g 1   valsartan  (DIOVAN ) 80 MG tablet Take 1 tablet (80 mg total) by mouth 2 (two) times daily. 180 tablet 3   Continuous Glucose Sensor (FREESTYLE LIBRE 3 PLUS SENSOR) MISC Change sensor every 15 days. 2 each 11   FREESTYLE PRECISION NEO TEST test strip Use as instructed once a day 100 each 1   FREESTYLE PRECISION NEO TEST test strip USE 1 STRIP TO CHECK GLUCOSE TWICE DAILY 100  each 0   No current facility-administered medications on file prior to visit.  [2]  Allergies Allergen Reactions   Dust Mite Extract Other (See Comments)    Congestion   Lisinopril  Cough

## 2024-11-23 ENCOUNTER — Ambulatory Visit: Admitting: Family Medicine

## 2024-11-30 ENCOUNTER — Ambulatory Visit: Admitting: Family Medicine

## 2024-12-03 ENCOUNTER — Encounter: Admitting: Nutrition

## 2025-02-28 ENCOUNTER — Ambulatory Visit: Admitting: Dermatology
